# Patient Record
Sex: Female | Born: 1990
Health system: Southern US, Community
[De-identification: ages and names within clinical notes are randomized; demographics above are authoritative.]

## PROBLEM LIST (undated history)

## (undated) DIAGNOSIS — F419 Anxiety disorder, unspecified: Secondary | ICD-10-CM

## (undated) DIAGNOSIS — Z8742 Personal history of other diseases of the female genital tract: Secondary | ICD-10-CM

## (undated) DIAGNOSIS — E559 Vitamin D deficiency, unspecified: Secondary | ICD-10-CM

## (undated) DIAGNOSIS — J45909 Unspecified asthma, uncomplicated: Secondary | ICD-10-CM

## (undated) DIAGNOSIS — L309 Dermatitis, unspecified: Secondary | ICD-10-CM

## (undated) DIAGNOSIS — K219 Gastro-esophageal reflux disease without esophagitis: Secondary | ICD-10-CM

## (undated) DIAGNOSIS — T7840XA Allergy, unspecified, initial encounter: Secondary | ICD-10-CM

## (undated) DIAGNOSIS — Q800 Ichthyosis vulgaris: Secondary | ICD-10-CM

## (undated) DIAGNOSIS — N83209 Unspecified ovarian cyst, unspecified side: Secondary | ICD-10-CM

## (undated) HISTORY — DX: Vitamin D deficiency, unspecified: E55.9

## (undated) HISTORY — DX: Personal history of other diseases of the female genital tract: Z87.42

## (undated) HISTORY — DX: Unspecified ovarian cyst, unspecified side: N83.209

## (undated) HISTORY — DX: Ichthyosis vulgaris: Q80.0

## (undated) HISTORY — DX: Unspecified asthma, uncomplicated: J45.909

## (undated) HISTORY — DX: Allergy, unspecified, initial encounter: T78.40XA

## (undated) HISTORY — PX: WISDOM TOOTH EXTRACTION: SHX21

## (undated) HISTORY — DX: Gastro-esophageal reflux disease without esophagitis: K21.9

## (undated) HISTORY — DX: Dermatitis, unspecified: L30.9

## (undated) HISTORY — DX: Anxiety disorder, unspecified: F41.9

---
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
## 2010-03-20 ENCOUNTER — Emergency Department: Payer: Self-pay | Admitting: Emergency Medicine

## 2010-11-14 ENCOUNTER — Ambulatory Visit: Payer: Self-pay | Admitting: Family Medicine

## 2011-01-10 ENCOUNTER — Ambulatory Visit: Payer: Self-pay | Admitting: Gastroenterology

## 2011-08-27 ENCOUNTER — Emergency Department: Payer: Self-pay | Admitting: Emergency Medicine

## 2011-08-27 LAB — COMPREHENSIVE METABOLIC PANEL
Albumin: 4 g/dL (ref 3.4–5.0)
Alkaline Phosphatase: 61 U/L (ref 50–136)
Anion Gap: 8 (ref 7–16)
BUN: 12 mg/dL (ref 7–18)
Bilirubin,Total: 0.3 mg/dL (ref 0.2–1.0)
Calcium, Total: 8.9 mg/dL (ref 8.5–10.1)
Chloride: 104 mmol/L (ref 98–107)
Co2: 28 mmol/L (ref 21–32)
Creatinine: 0.62 mg/dL (ref 0.60–1.30)
EGFR (African American): >60
EGFR (Non-African Amer.): >60
Glucose: 91 mg/dL (ref 65–99)
Osmolality: 279 (ref 275–301)
Potassium: 3.9 mmol/L (ref 3.5–5.1)
SGOT(AST): 21 U/L (ref 15–37)
SGPT (ALT): 33 U/L
Sodium: 140 mmol/L (ref 136–145)
Total Protein: 7.2 g/dL (ref 6.4–8.2)

## 2011-08-27 LAB — CBC
HCT: 42.9 % (ref 35.0–47.0)
HGB: 14.9 g/dL (ref 12.0–16.0)
MCH: 30.6 pg (ref 26.0–34.0)
MCHC: 34.8 g/dL (ref 32.0–36.0)
MCV: 88 fL (ref 80–100)
Platelet: 292 10*3/uL (ref 150–440)
RBC: 4.88 10*6/uL (ref 3.80–5.20)
RDW: 12.8 % (ref 11.5–14.5)
WBC: 11.8 10*3/uL — ABNORMAL HIGH (ref 3.6–11.0)

## 2011-08-27 LAB — MONONUCLEOSIS SCREEN: Mono Test: NEGATIVE

## 2014-11-22 ENCOUNTER — Ambulatory Visit (INDEPENDENT_AMBULATORY_CARE_PROVIDER_SITE_OTHER): Payer: 59 | Admitting: Family Medicine

## 2014-11-22 ENCOUNTER — Other Ambulatory Visit: Payer: Self-pay | Admitting: Family Medicine

## 2014-11-22 ENCOUNTER — Encounter: Payer: Self-pay | Admitting: Family Medicine

## 2014-11-22 VITALS — BP 108/72 | HR 89 | Temp 98.6°F | Resp 16 | Ht 61.0 in | Wt 137.1 lb

## 2014-11-22 DIAGNOSIS — J4520 Mild intermittent asthma, uncomplicated: Secondary | ICD-10-CM

## 2014-11-22 DIAGNOSIS — L309 Dermatitis, unspecified: Secondary | ICD-10-CM | POA: Insufficient documentation

## 2014-11-22 DIAGNOSIS — Z8742 Personal history of other diseases of the female genital tract: Secondary | ICD-10-CM

## 2014-11-22 DIAGNOSIS — J309 Allergic rhinitis, unspecified: Secondary | ICD-10-CM | POA: Insufficient documentation

## 2014-11-22 DIAGNOSIS — M25531 Pain in right wrist: Secondary | ICD-10-CM | POA: Insufficient documentation

## 2014-11-22 DIAGNOSIS — G5601 Carpal tunnel syndrome, right upper limb: Secondary | ICD-10-CM | POA: Diagnosis not present

## 2014-11-22 HISTORY — DX: Personal history of other diseases of the female genital tract: Z87.42

## 2014-11-22 MED ORDER — B & B CARPAL TUNNEL BRACE MISC: 1.0000 | Freq: Every day | Status: DC

## 2014-11-22 MED ORDER — IBUPROFEN 800 MG PO TABS: 800.0000 mg | ORAL_TABLET | Freq: Three times a day (TID) | ORAL | Status: DC | PRN

## 2014-11-22 MED ORDER — MONTELUKAST SODIUM 10 MG PO TABS: 10.0000 mg | ORAL_TABLET | Freq: Every day | ORAL | Status: DC

## 2014-11-22 NOTE — Progress Notes (Signed)
Name: Hayley Hunt   MRN: [id number]    DOB: Sep 23, 1990   Date:11/22/2014       Progress Note  Subjective  Chief Complaint  Chief Complaint  Patient presents with  . Carpal Tunnel    patient presents today with symptoms of right wrist pain since Father's day. Patient has tried Naproxen and has worn a brace to support it. Patient has had some numbness in her fingers and some radiation to her elbow at times. Patient states that the strength in her hand has decreased.    HPI  Hayley Hunt is a pleasant 24 year old female with no major medical history who presents today to discuss possibility of carpal tunnel syndrome. 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 Then she goes to work at [employer] afterwards some days lifting grocery bags which makes her symptoms worse. Symptoms are located right hand and wrist with initial pain and numbness radial aspect of 1st digit but now has progressed to symptoms involving 1st-3rd digits, weakness in hand grip and strength, dropping items. She has trid OTC naproxen and a home elastic wrist brace which has not helped with her symptoms. Needs refill of her montelukast.   Patient Active Problem List   Diagnosis Date Noted  . Allergic asthma 11/22/2014  . Dermatitis, eczematoid 11/22/2014  . H/O abnormal cervical Papanicolaou smear 11/22/2014  . Carpal tunnel syndrome, right 11/22/2014    History  Substance Use Topics  . Smoking status: Never Smoker   . Smokeless tobacco: Not on file  . Alcohol Use: No     Current outpatient prescriptions:  .  albuterol (VENTOLIN HFA) 108 (90 BASE) MCG/ACT inhaler, Inhale 1-2 puffs into the lungs every 6 (six) hours as needed., Disp: , Rfl:  .  Elastic Bandages & Supports (B & B CARPAL TUNNEL BRACE) MISC, 1 Device by Does not apply route daily., Disp: 1 each, Rfl: 0 .  ibuprofen (ADVIL,MOTRIN) 800 MG tablet, Take 1 tablet (800 mg total) by mouth every 8  (eight) hours as needed., Disp: 50 tablet, Rfl: 1  History reviewed. No pertinent past surgical history.  Family History  Problem Relation Age of Onset  . Asthma Mother     Allergies  Allergen Reactions  . Penicillins Anaphylaxis and Hives  . Latex      Review of Systems  CONSTITUTIONAL: No significant weight changes, fever, chills, weakness or fatigue.  HEENT:  - Eyes: No visual changes.  - Ears: No auditory changes. No pain.  - Nose: No sneezing, congestion, runny nose. - Throat: No sore throat. No changes in swallowing. SKIN: No rash or itching.  CARDIOVASCULAR: No chest pain, chest pressure or chest discomfort. No palpitations or edema.  RESPIRATORY: No shortness of breath, cough or sputum.  GASTROINTESTINAL: No anorexia, nausea, vomiting. No changes in bowel habits. No abdominal pain or blood.  GENITOURINARY: No dysuria. No frequency. No discharge.  NEUROLOGICAL: No headache, dizziness, syncope, paralysis, ataxia. Yes numbness or tingling in the right arm. No memory changes. No change in bowel or bladder control.  MUSCULOSKELETAL: Yes joint pain. No muscle pain. HEMATOLOGIC: No anemia, bleeding or bruising.  LYMPHATICS: No enlarged lymph nodes.  PSYCHIATRIC: No change in mood. No change in sleep pattern.  ENDOCRINOLOGIC: No reports of sweating, cold or heat intolerance. No polyuria or polydipsia.      Objective  BP 108/72 mmHg  Pulse 89  Temp(Src) 98.6 F (37  C) (Oral)  Resp 16  Ht 5\' 1"  (1.549 m)  Wt 137 lb 1.6 oz (62.188 kg)  BMI 25.92 kg/m2  SpO2 98%  LMP 10/11/2014 (Approximate) Body mass index is 25.92 kg/(m^2).  Physical Exam  Constitutional: Patient appears well-developed and well-nourished. In no distress.   Neck: Normal range of motion. Neck supple. No JVD present. No thyromegaly present.  Cardiovascular: Normal rate, regular rhythm and normal heart sounds.  No murmur heard.  Pulmonary/Chest: Effort normal and breath sounds normal. No  respiratory distress. Musculoskeletal: Normal range of motion bilateral UE and LE, no joint effusions. Peripheral vascular: Bilateral LE no edema.  Neurological: CN II-XII grossly intact with no focal deficits. Alert and oriented to person, place, and time. Coordination, balance, strength, speech and gait are normal. Positive Phalen's and Tinnel's testing of right wrist. No obvious wasting of thenar of hypothenar muscles however her grip strength is 4/5 in right hand. She has no MCP/PIP/DIP joint inflammation. Skin: Skin is warm and dry. Patches of eczema skin on hands and legs. Psychiatric: Patient has a normal mood and affect. Behavior is normal in office today. Judgment and thought content normal in office today.  Assessment & Plan   1. Carpal tunnel syndrome, right NSAIDs, heat ice alternating, modification of activity (work note provided), Neurology referral for nerve conduction study and further management. Rx for new brace provided.  - Elastic Bandages & Supports (B & B CARPAL TUNNEL BRACE) MISC; 1 Device by Does not apply route daily.  Dispense: 1 each; Refill: 0 - ibuprofen (ADVIL,MOTRIN) 800 MG tablet; Take 1 tablet (800 mg total) by mouth every 8 (eight) hours as needed.  Dispense: 50 tablet; Refill: 1 - Ambulatory referral to Neurology

## 2014-11-22 NOTE — Patient Instructions (Signed)
Carpal Tunnel Syndrome The carpal tunnel is a narrow area located on the palm side of your wrist. The tunnel is formed by the wrist bones and ligaments. Nerves, blood vessels, and tendons pass through the carpal tunnel. Repeated wrist motion or certain diseases may cause swelling within the tunnel. This swelling pinches the main nerve in the wrist (median nerve) and causes the painful hand and arm condition called carpal tunnel syndrome. CAUSES   Repeated wrist motions.  Wrist injuries.  Certain diseases like arthritis, diabetes, alcoholism, hyperthyroidism, and kidney failure.  Obesity.  Pregnancy. SYMPTOMS   A "pins and needles" feeling in your fingers or hand, especially in your thumb, index and middle fingers.  Tingling or numbness in your fingers or hand.  An aching feeling in your entire arm, especially when your wrist and elbow are bent for long periods of time.  Wrist pain that goes up your arm to your shoulder.  Pain that goes down into your palm or fingers.  A weak feeling in your hands. DIAGNOSIS  Your health care provider will take your history and perform a physical exam. An electromyography test may be needed. This test measures electrical signals sent out by your nerves into the muscles. The electrical signals are usually slowed by carpal tunnel syndrome. You may also need X-rays. TREATMENT  Carpal tunnel syndrome may clear up by itself. Your health care provider may recommend a wrist splint or medicine such as a nonsteroidal anti-inflammatory medicine. Cortisone injections may help. Sometimes, surgery may be needed to free the pinched nerve.  HOME CARE INSTRUCTIONS   Take all medicine as directed by your health care provider. Only take over-the-counter or prescription medicines for pain, discomfort, or fever as directed by your health care provider.  If you were given a splint to keep your wrist from bending, wear it as directed. It is important to wear the splint at  night. Wear the splint for as long as you have pain or numbness in your hand, arm, or wrist. This may take 1 to 2 months.  Rest your wrist from any activity that may be causing your pain. If your symptoms are work-related, you may need to talk to your employer about changing to a job that does not require using your wrist.  Put ice on your wrist after long periods of wrist activity.  Put ice in a plastic bag.  Place a towel between your skin and the bag.  Leave the ice on for 15-20 minutes, 03-04 times a day.  Keep all follow-up visits as directed by your health care provider. This includes any orthopedic referrals, physical therapy, and rehabilitation. Any delay in getting necessary care could result in a delay or failure of your condition to heal. SEEK IMMEDIATE MEDICAL CARE IF:   You have new, unexplained symptoms.  Your symptoms get worse and are not helped or controlled with medicines. MAKE SURE YOU:   Understand these instructions.  Will watch your condition.  Will get help right away if you are not doing well or get worse. Document Released: 04/06/2000 Document Revised: 08/24/2013 Document Reviewed: 02/23/2011 ExitCare Patient Information 2015 [employer]. This information is not intended to replace advice given to you by your health care provider. Make sure you discuss any questions you have with your health care provider.  

## 2014-11-30 ENCOUNTER — Telehealth: Payer: Self-pay | Admitting: Family Medicine

## 2014-11-30 NOTE — Telephone Encounter (Signed)
Patient was referred to [facility] for carpel tunnel in her wrist. They can not get her in until Sept 14. Patient is requesting that you send the referral to [facility]) they can get her in sooner. Please fax the referral to [phone] ATTN: Gerald Stabs

## 2014-12-03 NOTE — Telephone Encounter (Signed)
Referral information has been faxed to [facility], ATTN: Gerald Stabs to [phone], they'll contact the patient and schedule the appt directly with her then notify our office.

## 2015-03-14 ENCOUNTER — Telehealth: Payer: Self-pay | Admitting: Family Medicine

## 2015-03-14 ENCOUNTER — Other Ambulatory Visit: Payer: Self-pay | Admitting: Family Medicine

## 2015-03-14 DIAGNOSIS — M25531 Pain in right wrist: Secondary | ICD-10-CM

## 2015-03-14 NOTE — Telephone Encounter (Signed)
I've placed a referral to any Rheum other than [facility] however if there is a specific physician or office she is preferring to go to please let me know. How far is she willing to travel?

## 2015-03-14 NOTE — Telephone Encounter (Signed)
Was seen by Dr Joya Salm at [facility]: he told the patient that she does not have carpel tunnel, therefore he would like to refer her to a rheumatologist (patient requesting anyone but [facility]).  If we are not able to do the referral, Dr Joya Salm will to do the referral for [place] but we have to give him the okay because we are her PCP

## 2015-03-15 NOTE — Telephone Encounter (Signed)
Spoke with patient and she said that it is not a certain doctor that she would like to see and that she is willing to travel to [place] or [place]

## 2015-03-15 NOTE — Telephone Encounter (Signed)
Referral has been already sent to [facility].

## 2015-04-04 ENCOUNTER — Ambulatory Visit (INDEPENDENT_AMBULATORY_CARE_PROVIDER_SITE_OTHER): Payer: 59 | Admitting: Family Medicine

## 2015-04-04 ENCOUNTER — Encounter: Payer: Self-pay | Admitting: Family Medicine

## 2015-04-04 VITALS — BP 100/62 | HR 96 | Temp 98.8°F | Resp 16 | Wt 135.2 lb

## 2015-04-04 DIAGNOSIS — Z0000 Encounter for general adult medical examination without abnormal findings: Secondary | ICD-10-CM | POA: Diagnosis not present

## 2015-04-04 DIAGNOSIS — L309 Dermatitis, unspecified: Secondary | ICD-10-CM

## 2015-04-04 DIAGNOSIS — Z8742 Personal history of other diseases of the female genital tract: Secondary | ICD-10-CM

## 2015-04-04 DIAGNOSIS — J309 Allergic rhinitis, unspecified: Secondary | ICD-10-CM

## 2015-04-04 DIAGNOSIS — Z23 Encounter for immunization: Secondary | ICD-10-CM

## 2015-04-04 DIAGNOSIS — Z124 Encounter for screening for malignant neoplasm of cervix: Secondary | ICD-10-CM | POA: Diagnosis not present

## 2015-04-04 NOTE — Progress Notes (Signed)
Name: Hayley Hunt   MRN: [id number]    DOB: 1990/04/25   Date:04/04/2015       Progress Note  Subjective  Chief Complaint  Chief Complaint  Patient presents with  . Annual Exam  . Allergies    patient has never been on rx medication but she uses Flonase.    HPI  Patient is here today for a Complete Female Physical Exam:  The patient has has no unusual complaints and complains of allergic rhinitis. Onset years ago. Symptoms include nasal congestion and drainage in the back of throat leading to constant clearing. Using generic loratadine and brand OTC flonase.   Also has chronic dry skin. Can not use sun tan bed as she had previously during winter months to help with dryness due to dermatologist recommending against use of sun tanning beds. She does have topical steroid use which is keeping her skin from cracking but dryness and itching persists. Planning to see dermatologist soon.   Overall feels healthy. Diet is well balanced. In general does exercise regularly. Sees dentist regularly and addresses vision concerns with ophthalmologist if applicable. In regards to sexual activity the patient is currently sexually active. Currently is not concerned about exposure to any STDs.   Menstrual history is regular, monthly.    Past Medical History  Diagnosis Date  . Allergic asthma without acute exacerbation or status asthmaticus   . Eczema   . Ovarian cyst   . Common ichthyosis   . History of abnormal cervical Pap smear     History reviewed. No pertinent past surgical history.  Family History  Problem Relation Age of Onset  . Asthma Mother     Social History   Social History  . Marital Status: Single    Spouse Name: N/A  . Number of Children: N/A  . Years of Education: N/A   Occupational History  . Not on file.   Social History Main Topics  . Smoking status: Never Smoker   . Smokeless tobacco: Not on file  . Alcohol Use: No  . Drug Use: No  . Sexual Activity:   Partners: Male    Birth Control/ Protection: Condom   Other Topics Concern  . Not on file   Social History Narrative     Current outpatient prescriptions:  .  albuterol (VENTOLIN HFA) 108 (90 BASE) MCG/ACT inhaler, Inhale 1-2 puffs into the lungs every 6 (six) hours as needed., Disp: , Rfl:  .  diclofenac (VOLTAREN) 50 MG EC tablet, take 1 tablet by mouth twice a day with food or MILK, Disp: , Rfl: 0 .  Elastic Bandages & Supports (B & B CARPAL TUNNEL BRACE) MISC, 1 Device by Does not apply route daily., Disp: 1 each, Rfl: 0  Allergies  Allergen Reactions  . Penicillins Anaphylaxis and Hives  . Latex     ROS  CONSTITUTIONAL: No significant weight changes, fever, chills, weakness or fatigue.  HEENT:  - Eyes: No visual changes.  - Ears: No auditory changes. No pain.  - Nose: Yes sneezing, congestion, runny nose. - Throat: No sore throat. No changes in swallowing. SKIN: No rash. Chronic dryness with itching. CARDIOVASCULAR: No chest pain, chest pressure or chest discomfort. No palpitations or edema.  RESPIRATORY: No shortness of breath, cough or sputum.  GASTROINTESTINAL: No anorexia, nausea, vomiting. No changes in bowel habits. No abdominal pain or blood.  GENITOURINARY: No dysuria. No frequency. No discharge.  NEUROLOGICAL: No headache, dizziness, syncope, paralysis, ataxia, numbness or tingling in the  extremities. No memory changes. No change in bowel or bladder control.  MUSCULOSKELETAL: No joint pain. No muscle pain. HEMATOLOGIC: No anemia, bleeding or bruising.  LYMPHATICS: No enlarged lymph nodes.  PSYCHIATRIC: No change in mood. No change in sleep pattern.  ENDOCRINOLOGIC: No reports of sweating, cold or heat intolerance. No polyuria or polydipsia.   Objective  Filed Vitals:   04/04/15 0838  BP: 100/62  Pulse: 96  Temp: 98.8 F (37.1 C)  TempSrc: Oral  Resp: 16  Weight: 135 lb 3.2 oz (61.326 kg)  SpO2: 97%   Body mass index is 25.56 kg/(m^2).  Depression  screen [facility] 2/9 04/04/2015 11/22/2014  Decreased Interest 0 0  Down, Depressed, Hopeless 0 0  PHQ - 2 Score 0 0     Physical Exam  Constitutional: Patient appears well-developed and well-nourished. In no distress.  HEENT:  - Head: Normocephalic and atraumatic.  - Ears: Bilateral TMs gray, no erythema or effusion - Nose: Nasal mucosa moist, boggy - Mouth/Throat: Oropharynx is clear and moist. No tonsillar hypertrophy or erythema. No post nasal drainage.  - Eyes: Conjunctivae clear, EOM movements normal. PERRLA. No scleral icterus.  Neck: Normal range of motion. Neck supple. No JVD present. No thyromegaly present.  Cardiovascular: Normal rate, regular rhythm and normal heart sounds.  No murmur heard.  Pulmonary/Chest: Effort normal and breath sounds normal. No respiratory distress. Abdominal: Soft. Bowel sounds are normal, no distension. There is no tenderness. no masses BREAST: Bilateral breast exam normal with no masses, skin changes or nipple discharge FEMALE GENITALIA:  External genitalia normal External urethra normal Vaginal vault normal without discharge or lesions Cervix normal without discharge or lesions Bimanual exam normal without masses RECTAL: no rectal masses or hemorrhoids Musculoskeletal: Normal range of motion bilateral UE and LE, no joint effusions. Peripheral vascular: Bilateral LE no edema. Neurological: CN II-XII grossly intact with no focal deficits. Alert and oriented to person, place, and time. Coordination, balance, strength, speech and gait are normal.  Skin: Skin is warm with excessive dryness throughout, worse on hands. No rash noted. No erythema.  Psychiatric: Patient has a normal mood and affect. Behavior is normal in office today. Judgment and thought content normal in office today.   Assessment & Plan  1. Annual physical exam Discussed in detail all recommended preventative measures appropriate for age and gender now and in the future.   2. Need  for immunization against influenza Done  3. Dermatitis, eczematoid Continue working with dermatologist. Avoid harsh soaps.   4. Allergic rhinitis, unspecified allergic rhinitis type Recommended allergy consultation, she declined for now. Will try Allegra OTC.   5. H/O abnormal cervical Papanicolaou smear Previously ASCUS HPV, colpo negative, will repeat annually.   6. Encounter for screening for malignant neoplasm of cervix  - Pap IG and HPV (high risk) DNA detection

## 2015-04-08 LAB — PAP IG AND HPV HIGH-RISK
HPV, high-risk: NEGATIVE
PAP Smear Comment: 0

## 2015-07-29 ENCOUNTER — Encounter: Payer: Self-pay | Admitting: Family Medicine

## 2015-07-29 ENCOUNTER — Ambulatory Visit (INDEPENDENT_AMBULATORY_CARE_PROVIDER_SITE_OTHER): Payer: 59 | Admitting: Family Medicine

## 2015-07-29 VITALS — BP 110/78 | HR 90 | Temp 98.4°F | Resp 12 | Wt 137.0 lb

## 2015-07-29 DIAGNOSIS — R143 Flatulence: Secondary | ICD-10-CM | POA: Diagnosis not present

## 2015-07-29 DIAGNOSIS — R1030 Lower abdominal pain, unspecified: Secondary | ICD-10-CM

## 2015-07-29 DIAGNOSIS — R194 Change in bowel habit: Secondary | ICD-10-CM | POA: Diagnosis not present

## 2015-07-29 DIAGNOSIS — R140 Abdominal distension (gaseous): Secondary | ICD-10-CM | POA: Diagnosis not present

## 2015-07-29 NOTE — Progress Notes (Signed)
BP 110/78 mmHg  Pulse 90  Temp(Src) 98.4 F (36.9 C) (Oral)  Resp 12  Wt 137 lb (62.143 kg)  SpO2 98%  LMP 07/23/2015 (Approximate)   Subjective:    Patient ID: Hayley Hunt, female    DOB: Nov 06, 1990, 25 y.o.   MRN: :[id number]  HPI: Hayley Hunt is a 25 y.o. female  Chief Complaint  Patient presents with  . Abdominal Pain    onset 3 months unchanged has went to urgent care no imaging all labs was normal they notified her she may have gluten allergy.  Pain is located in the lower abdomen.  Patient denis any trauama, nausea, vomiting, diarrhea, constipation or dyuria.  She states hurts worse after she eats expericiences some bloating.  Constant pain describes as uncomfartable bloating and gas.    Patient has been having abdominal pain x 3 months She went to urgent care and ran tests for pregnancy, kidneys, appendix, and everything was fine She thinks it might be gluten intolerance She is lactose intolerance; still has some anyways; NO milk; very gassy; foul odor to the gas When she drinks milk, different smell than normal,  Had bronchitis in January and was on antibiotics No blood in the stool; looks normal, and healthy looking; more frequent , more amount Would like to checked for celiac No fam hx of celiac disease; no fam hx of rheumatology problems No fevers No nausea, but having abdominal bloatint She just had labs done by rheum; saw neuro first and then rheum and ortho; all since last June; just got the shot 3 weeks ago; had it so long; pain on the thenar eminence still; worried about ovaries; used to have a lot of ovarian cysts; has bloating down low in her pelvis; urgent care doctor agreed and thought it might be an ovarian cyst when he pushed on it  Relevant past medical, surgical, family and social history reviewed and updated as indicated  Past Medical History  Diagnosis Date  . Allergic asthma without acute exacerbation or status asthmaticus   . Eczema   .  Ovarian cyst   . Common ichthyosis   . History of abnormal cervical Pap smear    No past surgical history on file.  Family History  Problem Relation Age of Onset  . Asthma Mother    Medications reviewed  Review of Systems Per HPI unless specifically indicated above     Objective:    BP 110/78 mmHg  Pulse 90  Temp(Src) 98.4 F (36.9 C) (Oral)  Resp 12  Wt 137 lb (62.143 kg)  SpO2 98%  LMP 07/23/2015 (Approximate)  Wt Readings from Last 3 Encounters:  07/29/15 137 lb (62.143 kg)  04/04/15 135 lb 3.2 oz (61.326 kg)  11/22/14 137 lb 1.6 oz (62.188 kg)    Physical Exam  Constitutional: She appears well-developed and well-nourished.  HENT:  Mouth/Throat: Mucous membranes are normal.  Eyes: EOM are normal. No scleral icterus.  Cardiovascular: Normal rate and regular rhythm.   Pulmonary/Chest: Effort normal and breath sounds normal.  Abdominal: Soft. Bowel sounds are normal. She exhibits no distension and no mass. There is no tenderness. There is no guarding.  Neurological: She is alert.  Skin: No pallor.  Psychiatric: She has a normal mood and affect. Her behavior is normal.      Assessment & Plan:   Problem List Items Addressed This Visit      Other   Abdominal bloating - Primary    Will get [place]  and labs      Relevant Orders   US Pelvis Complete   US Transvaginal Non-OB   Postprandial abdominal bloating   Relevant Orders   US Pelvis Complete   US Transvaginal Non-OB   Change in stool habits    Will get [place], labs; refer to GI if not improving with probitoics      Relevant Orders   Comprehensive metabolic panel (Completed)   Celiac Disease Ab Screen w/Rfx (Completed)   Intestinal gas excretion    Trial of probiotics for one month      Abdominal pain, lower    With bloating; will get [place] of abdomen and pelvis; also getting labs to r/o celiac disease; start probiotic      Relevant Orders   US Pelvis Complete   US Transvaginal Non-OB   Comprehensive  metabolic panel (Completed)   Celiac Disease Ab Screen w/Rfx (Completed)      Follow up plan: No Follow-up on file.  An after-visit summary was printed and given to the patient at [facility].  Please see the patient instructions which may contain other information and recommendations beyond what is mentioned above in the assessment and plan. Orders Placed This Encounter  Procedures  . US Pelvis Complete  . US Transvaginal Non-OB  . Comprehensive metabolic panel  . Celiac Disease Ab Screen w/Rfx

## 2015-07-29 NOTE — Patient Instructions (Addendum)
Try probiotics daily for one month I'll recommend your next pap smear be Apr 04, 2016 We'll get the pelvic ultrasound We'll see what the labs show today If you have not heard anything from my staff in a week about any orders/referrals/studies from today, please contact us here to follow-up [phone] Call or return if your symptoms aren't resolving in the next few weeks, or certainly call or come in if they get worse

## 2015-08-02 ENCOUNTER — Telehealth: Payer: Self-pay | Admitting: Family Medicine

## 2015-08-02 LAB — COMPREHENSIVE METABOLIC PANEL
ALT: 13 IU/L (ref 0–32)
AST: 17 IU/L (ref 0–40)
Albumin/Globulin Ratio: 2.2 (ref 1.2–2.2)
Albumin: 4.9 g/dL (ref 3.5–5.5)
Alkaline Phosphatase: 74 IU/L (ref 39–117)
BUN/Creatinine Ratio: 17 (ref 9–23)
BUN: 13 mg/dL (ref 6–20)
Bilirubin Total: 0.4 mg/dL (ref 0.0–1.2)
CO2: 25 mmol/L (ref 18–29)
Calcium: 9.7 mg/dL (ref 8.7–10.2)
Chloride: 98 mmol/L (ref 96–106)
Creatinine, Ser: 0.75 mg/dL (ref 0.57–1.00)
GFR calc Af Amer: 128 mL/min/{1.73_m2} (ref >59)
GFR calc non Af Amer: 111 mL/min/{1.73_m2} (ref >59)
Globulin, Total: 2.2 g/dL (ref 1.5–4.5)
Glucose: 96 mg/dL (ref 65–99)
Potassium: 4.3 mmol/L (ref 3.5–5.2)
Sodium: 139 mmol/L (ref 134–144)
Total Protein: 7.1 g/dL (ref 6.0–8.5)

## 2015-08-02 LAB — CELIAC DISEASE AB SCREEN W/RFX
Antigliadin Abs, IgA: 1 units (ref 0–19)
IgA/Immunoglobulin A, Serum: 118 mg/dL (ref 87–352)
Transglutaminase IgA: <2 U/mL (ref 0–3)

## 2015-08-02 NOTE — Telephone Encounter (Signed)
I called, left msg, labs okay; if any specific questions or if she wants to discuss them further, please call me back

## 2015-08-03 NOTE — Assessment & Plan Note (Signed)
Will get [place] and labs

## 2015-08-03 NOTE — Assessment & Plan Note (Signed)
Will get [place], labs; refer to GI if not improving with probitoics

## 2015-08-03 NOTE — Assessment & Plan Note (Signed)
With bloating; will get [place] of abdomen and pelvis; also getting labs to r/o celiac disease; start probiotic

## 2015-08-03 NOTE — Assessment & Plan Note (Signed)
Trial of probiotics for one month

## 2015-08-10 ENCOUNTER — Ambulatory Visit
Admission: RE | Admit: 2015-08-10 | Discharge: 2015-08-10 | Disposition: A | Discharge status: Home / Self Care | Payer: 59 | Source: Ambulatory Visit | Attending: Family Medicine | Admitting: Family Medicine

## 2015-08-10 DIAGNOSIS — R140 Abdominal distension (gaseous): Secondary | ICD-10-CM | POA: Insufficient documentation

## 2015-08-10 DIAGNOSIS — N83202 Unspecified ovarian cyst, left side: Secondary | ICD-10-CM | POA: Diagnosis not present

## 2015-08-11 ENCOUNTER — Telehealth: Payer: Self-pay | Admitting: Family Medicine

## 2015-08-11 NOTE — Telephone Encounter (Signed)
I sent her a note through [place]

## 2015-08-11 NOTE — Telephone Encounter (Signed)
PT ASKING FOR RESULTS OF TEST

## 2015-09-01 ENCOUNTER — Emergency Department
Admission: EM | Admit: 2015-09-01 | Discharge: 2015-09-01 | Disposition: A | Discharge status: Home / Self Care | Payer: 59 | Attending: Emergency Medicine | Admitting: Emergency Medicine

## 2015-09-01 ENCOUNTER — Emergency Department: Payer: 59

## 2015-09-01 DIAGNOSIS — J45901 Unspecified asthma with (acute) exacerbation: Secondary | ICD-10-CM

## 2015-09-01 DIAGNOSIS — J45909 Unspecified asthma, uncomplicated: Secondary | ICD-10-CM | POA: Insufficient documentation

## 2015-09-01 DIAGNOSIS — R05 Cough: Secondary | ICD-10-CM | POA: Diagnosis present

## 2015-09-01 MED ORDER — PREDNISONE 10 MG PO TABS: ORAL_TABLET | ORAL | Status: DC

## 2015-09-01 MED ORDER — IPRATROPIUM-ALBUTEROL 0.5-2.5 (3) MG/3ML IN SOLN
3.0000 mL | Freq: Once | RESPIRATORY_TRACT | Status: AC
  Administered 2015-09-01: 3 mL via RESPIRATORY_TRACT
  Filled 2015-09-01: qty 3

## 2015-09-01 MED ORDER — IPRATROPIUM-ALBUTEROL 0.5-2.5 (3) MG/3ML IN SOLN: 3.0000 mL | RESPIRATORY_TRACT | Status: DC | PRN

## 2015-09-01 NOTE — ED Provider Notes (Signed)
[facility] Emergency Department Provider Note  ____________________________________________  Time seen: Approximately 9:52 PM  I have reviewed the triage vital signs and the nursing notes.   HISTORY  Chief Complaint Cough    HPI KEYSTAL BRIDEWELL is a 25 y.o. female who presents for evaluation of asthma and cough. Patient states that she is to return inhaler at home but feels extremely short of breath. Feels like she has a hard time catching her breath. Patient reports symptoms started yesterday and progressively worsened today.   Past Medical History  Diagnosis Date  . Allergic asthma without acute exacerbation or status asthmaticus   . Eczema   . Ovarian cyst   . Common ichthyosis   . History of abnormal cervical Pap smear     Patient Active Problem List   Diagnosis Date Noted  . Abdominal bloating 07/29/2015  . Postprandial abdominal bloating 07/29/2015  . Change in stool habits 07/29/2015  . Intestinal gas excretion 07/29/2015  . Abdominal pain, lower 07/29/2015  . Annual physical exam 04/04/2015  . Encounter for immunization 04/04/2015  . Encounter for screening for malignant neoplasm of cervix 04/04/2015  . Allergic rhinitis 11/22/2014  . Dermatitis, eczematoid 11/22/2014  . H/O abnormal cervical Papanicolaou smear 11/22/2014  . Wrist pain, right 11/22/2014    No past surgical history on file.  Current Outpatient Rx  Name  Route  Sig  Dispense  Refill  . albuterol (VENTOLIN HFA) 108 (90 BASE) MCG/ACT inhaler   Inhalation   Inhale 1-2 puffs into the lungs every 6 (six) hours as needed.         Marland Kitchen ipratropium-albuterol (DUONEB) 0.5-2.5 (3) MG/3ML SOLN   Nebulization   Take 3 mLs by nebulization every 2 (two) hours as needed.   360 mL   0   . predniSONE (DELTASONE) 10 MG tablet      Take 6 pills on day 1 and decrease by 1 pill daily.   21 tablet   0     Allergies Penicillins and Latex  Family History  Problem Relation Age  of Onset  . Asthma Mother     Social History Social History  Substance Use Topics  . Smoking status: Never Smoker   . Smokeless tobacco: Not on file  . Alcohol Use: No    Review of Systems Constitutional: No fever/chills Cardiovascular: Denies chest pain. Respiratory: Positive for shortness of breath. Skin: Negative for rash. Neurological: Negative for headaches, focal weakness or numbness.  10-point ROS otherwise negative.  ____________________________________________   PHYSICAL EXAM:  VITAL SIGNS: ED Triage Vitals  Enc Vitals Group     BP 09/01/15 2133 120/78 mmHg     Pulse Rate 09/01/15 2133 87     Resp 09/01/15 2133 20     Temp 09/01/15 2133 98.3 F (36.8 C)     Temp Source 09/01/15 2133 Oral     SpO2 09/01/15 2133 100 %     Weight 09/01/15 2133 137 lb (62.143 kg)     Height 09/01/15 2133 5' (1.524 m)     Head Cir --      Peak Flow --      Pain Score 09/01/15 2151 0     Pain Loc --      Pain Edu? --      Excl. in [place]? --     Constitutional: Alert and oriented. Well appearing and in no acute distress. Neck: No stridor.   Cardiovascular: Normal rate, regular rhythm. Grossly normal heart sounds.  Good peripheral circulation. Respiratory: Normal respiratory effort.  No retractions. Lungs CTABWith minimal wheezing.. Skin:  Skin is warm, dry and intact. No rash noted. Psychiatric: Mood and affect are normal. Speech and behavior are normal.  ____________________________________________   LABS (all labs ordered are listed, but only abnormal results are displayed)  Labs Reviewed - No data to display ____________________________________________  RADIOLOGY  No acute cardiopulmonary findings. ____________________________________________   PROCEDURES  Procedure(s) performed: None  Critical Care performed: No  ____________________________________________   INITIAL IMPRESSION / ASSESSMENT AND PLAN / ED COURSE  Pertinent labs & imaging results that  were available during my care of the patient were reviewed by me and considered in my medical decision making (see chart for details).  Acute exacerbation of asthma resolved prior to discharge. Patient was given a DuoNeb treatment while in the ED with significant improvement. Discharged home with prescription for tapering dose of prednisone as well as DuoNeb inhalation therapy treatments for home. ____________________________________________   FINAL CLINICAL IMPRESSION(S) / ED DIAGNOSES  Final diagnoses:  Asthma attack     This chart was dictated using voice recognition software/Dragon. Despite best efforts to proofread, errors can occur which can change the meaning. Any change was purely unintentional.   Arlyss Repress, PA-C 09/01/15 2314  Lavonia Drafts, MD 09/01/15 [phone]

## 2015-09-01 NOTE — Discharge Instructions (Signed)

## 2015-09-01 NOTE — ED Notes (Signed)
Pt in with co shob states has asthma and yesterday developed a cough.  Has used inhalers at home but still feels shob, none noted in triage at this time.

## 2015-09-13 ENCOUNTER — Ambulatory Visit (INDEPENDENT_AMBULATORY_CARE_PROVIDER_SITE_OTHER): Payer: 59 | Admitting: Family Medicine

## 2015-09-13 ENCOUNTER — Encounter: Payer: Self-pay | Admitting: Family Medicine

## 2015-09-13 VITALS — BP 120/68 | HR 99 | Temp 98.6°F | Resp 16 | Wt 137.0 lb

## 2015-09-13 DIAGNOSIS — J45909 Unspecified asthma, uncomplicated: Secondary | ICD-10-CM | POA: Insufficient documentation

## 2015-09-13 DIAGNOSIS — J45998 Other asthma: Secondary | ICD-10-CM

## 2015-09-13 DIAGNOSIS — R143 Flatulence: Secondary | ICD-10-CM | POA: Diagnosis not present

## 2015-09-13 HISTORY — DX: Unspecified asthma, uncomplicated: J45.909

## 2015-09-13 MED ORDER — MONTELUKAST SODIUM 10 MG PO TABS: 10.0000 mg | ORAL_TABLET | Freq: Every day | ORAL | Status: DC

## 2015-09-13 NOTE — Assessment & Plan Note (Signed)
Try going gluten free for one month; refer to GI if not improving

## 2015-09-13 NOTE — Assessment & Plan Note (Signed)
Start singulair; avoid triggers

## 2015-09-13 NOTE — Progress Notes (Signed)
BP 120/68 mmHg  Pulse 99  Temp(Src) 98.6 F (37 C) (Oral)  Resp 16  Wt 137 lb (62.143 kg)  SpO2 98%  LMP 08/27/2015 (Approximate)   Subjective:    Patient ID: Hayley Hunt, female    DOB: 1990/06/21, 25 y.o.   MRN: [place]:[id number]  HPI: Hayley Hunt is a 25 y.o. female  Chief Complaint  Patient presents with  . ER follow-up  . Asthma   She went to [facility] a month ago; after returning, breathing wasn't the same; 2 weeks ended up in the ER after doing her inhaler; they gave her neb treatments in ER and steroid taper; no antibiotics; they said other people were also having asthma attacks, the same night Her throat was also bothering her; felt tight all the way up She has had asthma her entire life, and this was the first time she felt it all the way up She moved a week before she went to [facility]; floors are mostly carpet; no potted plants; no stuffed animals; does have some pillows; has a Market researcher, two years  She is having some stomach issues; she was taking probiotics and it got better; every time she ate, bloating, gassy, going to the bathroom a lot; regular BMs now, but still gassy; she had the ultrasound, cyst on ovaries; stomach grows, stomach gets distended; gaining weight lately; had a really bad gas pain along the left colic gutter  ------------------------- CLINICAL DATA: Lower abdominal pain and bloating for 3 months. History of ovarian cysts. LMP 07/23/2015.  EXAM: TRANSABDOMINAL AND TRANSVAGINAL ULTRASOUND OF PELVIS  TECHNIQUE: Both transabdominal and transvaginal ultrasound examinations of the pelvis were performed. Transabdominal technique was performed for global imaging of the pelvis including uterus, ovaries, adnexal regions, and pelvic cul-de-sac. It was necessary to proceed with endovaginal exam following the transabdominal exam to visualize the endometrial stripe and ovaries.  COMPARISON: 11/14/2010  FINDINGS: Uterus  Measurements: 8.1 x  3.5 x 5.8 cm. No fibroids or other mass visualized.  Endometrium  Thickness: 9 mm. No focal abnormality visualized.  Right ovary  Measurements: 3.6 x 2.3 x 3.8 cm. Normal appearance/no adnexal mass. Small approximately 2 cm corpus luteum cyst noted.  Left ovary  Measurements: 2.8 x 2.2 x 1.9 cm. Normal appearance. A tiny simple left paraovarian cyst is seen measuring 1.1 cm in maximum diameter.  Other findings  No abnormal free fluid.  IMPRESSION: 1.1 cm benign-appearing left paraovarian cyst.  No other sonographic abnormality identified.   Electronically Signed  By: Earle Gell M.D.  On: 08/10/2015 20:08  Depression screen [facility] 2/9 09/13/2015 07/29/2015 04/04/2015 11/22/2014  Decreased Interest 0 0 0 0  Down, Depressed, Hopeless 0 0 0 0  PHQ - 2 Score 0 0 0 0   Relevant past medical, surgical, family and social history reviewed Past Medical History  Diagnosis Date  . Allergic asthma without acute exacerbation or status asthmaticus   . Eczema   . Ovarian cyst   . Common ichthyosis   . History of abnormal cervical Pap smear   . Asthma, mild 09/13/2015   Family History  Problem Relation Age of Onset  . Asthma Mother    Social History  Substance Use Topics  . Smoking status: Never Smoker   . Smokeless tobacco: None  . Alcohol Use: No  Dog: Zane, Yorkie  Interim medical history since last visit reviewed. Allergies and medications reviewed  Review of Systems Per HPI unless specifically indicated above     Objective:  BP 120/68 mmHg  Pulse 99  Temp(Src) 98.6 F (37 C) (Oral)  Resp 16  Wt 137 lb (62.143 kg)  SpO2 98%  LMP 08/27/2015 (Approximate)  Wt Readings from Last 3 Encounters:  09/13/15 137 lb (62.143 kg)  09/01/15 137 lb (62.143 kg)  07/29/15 137 lb (62.143 kg)    Physical Exam  Constitutional: She appears well-developed and well-nourished. No distress.  Eyes: No scleral icterus.  Cardiovascular: Normal rate and regular  rhythm.   Pulmonary/Chest: Effort normal and breath sounds normal. No respiratory distress. She has no wheezes.  Abdominal: Bowel sounds are normal. She exhibits no distension. There is no tenderness. There is no guarding.  Skin: No pallor.  Psychiatric: She has a normal mood and affect.    Results for orders placed or performed in visit on 07/29/15  Comprehensive metabolic panel  Result Value Ref Range   Glucose 96 65 - 99 mg/dL   BUN 13 6 - 20 mg/dL   Creatinine, Ser 0.75 0.57 - 1.00 mg/dL   GFR calc non Af Amer 111 >59 mL/min/1.73   GFR calc Af Amer 128 >59 mL/min/1.73   BUN/Creatinine Ratio 17 9 - 23   Sodium 139 134 - 144 mmol/L   Potassium 4.3 3.5 - 5.2 mmol/L   Chloride 98 96 - 106 mmol/L   CO2 25 18 - 29 mmol/L   Calcium 9.7 8.7 - 10.2 mg/dL   Total Protein 7.1 6.0 - 8.5 g/dL   Albumin 4.9 3.5 - 5.5 g/dL   Globulin, Total 2.2 1.5 - 4.5 g/dL   Albumin/Globulin Ratio 2.2 1.2 - 2.2   Bilirubin Total 0.4 0.0 - 1.2 mg/dL   Alkaline Phosphatase 74 39 - 117 IU/L   AST 17 0 - 40 IU/L   ALT 13 0 - 32 IU/L  Celiac Disease Ab Screen w/Rfx  Result Value Ref Range   Antigliadin Abs, IgA 1 0 - 19 units   Transglutaminase IgA <2 0 - 3 U/mL   IgA/Immunoglobulin A, Serum 118 87 - 352 mg/dL      Assessment & Plan:   Problem List Items Addressed This Visit      Respiratory   Asthma, mild    Start singulair; avoid triggers      Relevant Medications   montelukast (SINGULAIR) 10 MG tablet     Other   Intestinal gas excretion - Primary    Try going gluten free for one month; refer to GI if not improving         Follow up plan: Return if symptoms worsen or fail to improve.  An after-visit summary was printed and given to the patient at [place].  Please see the patient instructions which may contain other information and recommendations beyond what is mentioned above in the assessment and plan.  Meds ordered this encounter  Medications  . montelukast (SINGULAIR) 10 MG  tablet    Sig: Take 1 tablet (10 mg total) by mouth at bedtime.    Dispense:  30 tablet    Refill:  11    No orders of the defined types were placed in this encounter.

## 2015-09-13 NOTE — Patient Instructions (Signed)
Try going completely gluten free for one month If your bowel symptoms persist, then let me know and we can refer you to the gastroenterologist Start the singulair Goal asthma control is rescue inhaler no more than twice a week

## 2015-09-28 ENCOUNTER — Ambulatory Visit: Payer: 59 | Admitting: Family Medicine

## 2015-10-24 ENCOUNTER — Ambulatory Visit (INDEPENDENT_AMBULATORY_CARE_PROVIDER_SITE_OTHER): Payer: 59 | Admitting: Family Medicine

## 2015-10-24 ENCOUNTER — Encounter: Payer: Self-pay | Admitting: Family Medicine

## 2015-10-24 VITALS — BP 110/72 | HR 82 | Temp 99.4°F | Resp 16 | Ht 60.0 in | Wt 140.2 lb

## 2015-10-24 DIAGNOSIS — J387 Other diseases of larynx: Secondary | ICD-10-CM

## 2015-10-24 DIAGNOSIS — K219 Gastro-esophageal reflux disease without esophagitis: Secondary | ICD-10-CM

## 2015-10-24 DIAGNOSIS — J45909 Unspecified asthma, uncomplicated: Secondary | ICD-10-CM

## 2015-10-24 DIAGNOSIS — J45998 Other asthma: Secondary | ICD-10-CM

## 2015-10-24 MED ORDER — RANITIDINE HCL 150 MG PO TABS: 150.0000 mg | ORAL_TABLET | Freq: Two times a day (BID) | ORAL | Status: DC

## 2015-10-24 MED ORDER — BENZONATATE 100 MG PO CAPS: 100.0000 mg | ORAL_CAPSULE | Freq: Three times a day (TID) | ORAL | Status: DC | PRN

## 2015-10-24 MED ORDER — BECLOMETHASONE DIPROPIONATE 40 MCG/ACT IN AERS: 2.0000 | INHALATION_SPRAY | Freq: Two times a day (BID) | RESPIRATORY_TRACT | Status: DC

## 2015-10-24 NOTE — Assessment & Plan Note (Signed)
Start QVAR and stop singulair; likely home environment with carpet; PFTs ordered, refer to pulmonologist if not getting better

## 2015-10-24 NOTE — Patient Instructions (Signed)
We'll start ranitidine 150 mg twice a day Elevate the head of your bed a little Start the QVAR and use twice a day every day Use the rescue inhaler if needed Stop the singulair We'll get pulmonary tests Use the cough medicine if needed Avoid any triggers

## 2015-10-24 NOTE — Progress Notes (Signed)
BP 110/72   Pulse 82   Temp 99.4 F (37.4 C) (Oral)   Resp 16   Ht 5' (1.524 m)   Wt 140 lb 3.2 oz (63.6 kg)   SpO2 94%   BMI 27.38 kg/m    98% within the fingernail polish  Subjective:    Patient ID: Hayley Hunt, female    DOB: 09/01/1990, 25 y.o.   MRN: [place]:[id number]  HPI: Hayley Hunt is a 25 y.o. female  Chief Complaint  Patient presents with  . Shortness of Breath    on and off for 2 months   She has been having shortness of breath and cough for 2 months; dry cough Two months, went to the ER, nebulizer treatment there and steroids and albuterol Then we added singulair, but it makes her tired She went to urgent care; they thought it was maybe an infection; if she didn't feel better after the antibiotic, then saw the ENT; she found him on Google; he thinks she has acid reflux, probably going on for years; always clears her throat and feels like she has a ball of spit that needs to come up; both parents have acid reflex; he did the mirror and stuck a light down her nose; she took the PPI for 30 days and then said to stop it because it can cause problems long-term More burping and burning without the PPI She uses the inhaler when she goes to the gym; the inhaler makes her lungs burn No hx of blood clot; no birth control Carpet in the home environment; dog, long-haired dog; dog sleeps with her, sleeping with her for 2 years; just got bad 2 months ago Slight sore throat Cut out the inhaler for the most part No spelunking, no cave exploration, no pet birds No work exposures No plug-in air fresheners  Chest xray Sep 01, 2015 CLINICAL DATA: 25 year old female with cough  EXAM: CHEST 2 VIEW  COMPARISON: Chest radiograph dated 08/27/2011  FINDINGS: The heart size and mediastinal contours are within normal limits. Both lungs are clear. The visualized skeletal structures are unremarkable.  IMPRESSION: No active cardiopulmonary disease.   Electronically  Signed  By: Anner Crete M.D.  On: 09/01/2015 22:37   Depression screen [facility] 2/9 10/24/2015 09/13/2015 07/29/2015 04/04/2015 11/22/2014  Decreased Interest 0 0 0 0 0  Down, Depressed, Hopeless 0 0 0 0 0  PHQ - 2 Score 0 0 0 0 0   Relevant past medical, surgical, family and social history reviewed Past Medical History:  Diagnosis Date  . Allergic asthma without acute exacerbation or status asthmaticus   . Asthma, mild 09/13/2015  . Common ichthyosis   . Eczema   . History of abnormal cervical Pap smear   . Ovarian cyst    History reviewed. No pertinent surgical history. Family History  Problem Relation Age of Onset  . Asthma Mother    Social History  Substance Use Topics  . Smoking status: Never Smoker  . Smokeless tobacco: Not on file  . Alcohol use No   Interim medical history since last visit reviewed. Allergies and medications reviewed  Review of Systems Per HPI unless specifically indicated above     Objective:    BP 110/72   Pulse 82   Temp 99.4 F (37.4 C) (Oral)   Resp 16   Ht 5' (1.524 m)   Wt 140 lb 3.2 oz (63.6 kg)   SpO2 94%   BMI 27.38 kg/m   Wt Readings from  Last 3 Encounters:  10/24/15 140 lb 3.2 oz (63.6 kg)  09/13/15 137 lb (62.1 kg)  09/01/15 137 lb (62.1 kg)    Physical Exam  Constitutional: She appears well-developed and well-nourished. No distress.  Eyes: EOM are normal. No scleral icterus.  Neck: No JVD present. No tracheal deviation present. No thyromegaly present.  Cardiovascular: Normal rate and regular rhythm.   Pulmonary/Chest: Effort normal and breath sounds normal. No respiratory distress. She has no wheezes. She has no rales.  Abdominal: She exhibits no distension.  Lymphadenopathy:    She has no cervical adenopathy.  Skin: No rash noted. She is not diaphoretic. No pallor.  Psychiatric: She has a normal mood and affect. Her behavior is normal. Judgment and thought content normal. Her mood appears not anxious.    Results for  orders placed or performed in visit on 07/29/15  Comprehensive metabolic panel  Result Value Ref Range   Glucose 96 65 - 99 mg/dL   BUN 13 6 - 20 mg/dL   Creatinine, Ser 0.75 0.57 - 1.00 mg/dL   GFR calc non Af Amer 111 >59 mL/min/1.73   GFR calc Af Amer 128 >59 mL/min/1.73   BUN/Creatinine Ratio 17 9 - 23   Sodium 139 134 - 144 mmol/L   Potassium 4.3 3.5 - 5.2 mmol/L   Chloride 98 96 - 106 mmol/L   CO2 25 18 - 29 mmol/L   Calcium 9.7 8.7 - 10.2 mg/dL   Total Protein 7.1 6.0 - 8.5 g/dL   Albumin 4.9 3.5 - 5.5 g/dL   Globulin, Total 2.2 1.5 - 4.5 g/dL   Albumin/Globulin Ratio 2.2 1.2 - 2.2   Bilirubin Total 0.4 0.0 - 1.2 mg/dL   Alkaline Phosphatase 74 39 - 117 IU/L   AST 17 0 - 40 IU/L   ALT 13 0 - 32 IU/L  Celiac Disease Ab Screen w/Rfx  Result Value Ref Range   Antigliadin Abs, IgA 1 0 - 19 units   Transglutaminase IgA <2 0 - 3 U/mL   IgA/Immunoglobulin A, Serum 118 87 - 352 mg/dL      Assessment & Plan:   Problem List Items Addressed This Visit      Respiratory   Laryngopharyngeal reflux (LPR)    ddx includes LPR with acid secretions spilling into the lungs, exacerbating breathing issues; will start H2 blocker BID in addition to the inhaler corticosteroid      Asthma, mild - Primary    Start QVAR and stop singulair; likely home environment with carpet; PFTs ordered, refer to pulmonologist if not getting better      Relevant Orders   Pulmonary Function Test [facility] Only (Completed)    Other Visit Diagnoses   None.     Follow up plan: Return in about 4 weeks (around 11/21/2015) for follow-up.  An after-visit summary was printed and given to the patient at [place].  Please see the patient instructions which may contain other information and recommendations beyond what is mentioned above in the assessment and plan.  Meds ordered this encounter  Medications  . ranitidine (ZANTAC) 150 MG tablet    Sig: Take 1 tablet (150 mg total) by mouth 2 (two) times daily.     Dispense:  60 tablet    Refill:  5  . DISCONTD: beclomethasone (QVAR) 40 MCG/ACT inhaler    Sig: Inhale 2 puffs into the lungs 2 (two) times daily. Every day; rinse mouth out after use    Dispense:  1 Inhaler  Refill:  12  . benzonatate (TESSALON PERLES) 100 MG capsule    Sig: Take 1 capsule (100 mg total) by mouth every 8 (eight) hours as needed for cough.    Dispense:  30 capsule    Refill:  0    Orders Placed This Encounter  Procedures  . Pulmonary Function Test [facility] Only

## 2015-11-03 ENCOUNTER — Ambulatory Visit: Payer: 59 | Attending: Family Medicine

## 2015-11-03 DIAGNOSIS — J45909 Unspecified asthma, uncomplicated: Secondary | ICD-10-CM | POA: Diagnosis not present

## 2015-11-03 MED ORDER — ALBUTEROL SULFATE (2.5 MG/3ML) 0.083% IN NEBU
2.5000 mg | INHALATION_SOLUTION | Freq: Once | RESPIRATORY_TRACT | Status: AC
  Administered 2015-11-03: 2.5 mg via RESPIRATORY_TRACT
  Filled 2015-11-03: qty 3

## 2015-11-04 NOTE — Progress Notes (Unsigned)
PFT

## 2015-12-09 ENCOUNTER — Other Ambulatory Visit: Payer: Self-pay | Admitting: Family Medicine

## 2015-12-10 MED ORDER — BECLOMETHASONE DIPROPIONATE 40 MCG/ACT IN AERS: 2.0000 | INHALATION_SPRAY | Freq: Two times a day (BID) | RESPIRATORY_TRACT | 3 refills | Status: DC

## 2015-12-16 ENCOUNTER — Telehealth: Payer: Self-pay | Admitting: Family Medicine

## 2015-12-16 MED ORDER — BECLOMETHASONE DIPROPIONATE 40 MCG/ACT IN AERS: 2.0000 | INHALATION_SPRAY | Freq: Two times a day (BID) | RESPIRATORY_TRACT | 3 refills | Status: DC

## 2015-12-16 NOTE — Telephone Encounter (Signed)
Yes, please let her know that I re-sent is and wrote "GENERIC PLEASE" on Rx and asked pharmacy to let me know if less expensive alternative

## 2015-12-19 NOTE — Telephone Encounter (Signed)
LMOM to inform pt °

## 2015-12-25 DIAGNOSIS — K219 Gastro-esophageal reflux disease without esophagitis: Secondary | ICD-10-CM | POA: Insufficient documentation

## 2015-12-25 NOTE — Assessment & Plan Note (Signed)
ddx includes LPR with acid secretions spilling into the lungs, exacerbating breathing issues; will start H2 blocker BID in addition to the inhaler corticosteroid

## 2016-01-12 ENCOUNTER — Telehealth: Payer: Self-pay | Admitting: Family Medicine

## 2016-01-12 NOTE — Telephone Encounter (Signed)
Pt notified, pharmacy called

## 2016-04-06 ENCOUNTER — Encounter: Payer: 59 | Admitting: Family Medicine

## 2016-04-17 ENCOUNTER — Encounter: Payer: Self-pay | Admitting: *Deleted

## 2016-04-17 ENCOUNTER — Emergency Department
Admission: EM | Admit: 2016-04-17 | Discharge: 2016-04-17 | Disposition: A | Discharge status: Home / Self Care | Payer: 59 | Attending: Emergency Medicine | Admitting: Emergency Medicine

## 2016-04-17 ENCOUNTER — Emergency Department: Payer: 59

## 2016-04-17 DIAGNOSIS — J45909 Unspecified asthma, uncomplicated: Secondary | ICD-10-CM | POA: Insufficient documentation

## 2016-04-17 DIAGNOSIS — R51 Headache: Secondary | ICD-10-CM | POA: Diagnosis present

## 2016-04-17 DIAGNOSIS — Z79899 Other long term (current) drug therapy: Secondary | ICD-10-CM | POA: Insufficient documentation

## 2016-04-17 DIAGNOSIS — G43809 Other migraine, not intractable, without status migrainosus: Secondary | ICD-10-CM | POA: Diagnosis not present

## 2016-04-17 MED ORDER — BUTALBITAL-APAP-CAFFEINE 50-325-40 MG PO TABS: 1.0000 | ORAL_TABLET | Freq: Four times a day (QID) | ORAL | 0 refills | Status: DC | PRN

## 2016-04-17 MED ORDER — METOCLOPRAMIDE HCL 10 MG PO TABS
10.0000 mg | ORAL_TABLET | Freq: Once | ORAL | Status: AC
  Administered 2016-04-17: 10 mg via ORAL
  Filled 2016-04-17 (×2): qty 1

## 2016-04-17 MED ORDER — ACETAMINOPHEN 500 MG PO TABS
1000.0000 mg | ORAL_TABLET | Freq: Once | ORAL | Status: AC
  Administered 2016-04-17: 1000 mg via ORAL
  Filled 2016-04-17: qty 2

## 2016-04-17 NOTE — ED Notes (Signed)
Pt alert and oriented X4, active, cooperative, pt in NAD. RR even and unlabored, color WNL.  Pt informed to return if any life threatening symptoms occur.   

## 2016-04-17 NOTE — ED Triage Notes (Signed)
States a headache that began today, states she had a period of tingling in her right hand which has since resolved, states hx of migranes in the past, awake and alert in no acute distress

## 2016-04-17 NOTE — ED Provider Notes (Signed)
[facility] Emergency Department Provider Note        Time seen: ----------------------------------------- 2:43 PM on 04/17/2016 -----------------------------------------    I have reviewed the triage vital signs and the nursing notes.   HISTORY  Chief Complaint Headache    HPI Hayley Hunt is a 25 y.o. female who presents ER for headache that began today. Patient states while she was driving she experienced a period of tingling in her right hand in a stocking and glove distribution which is since resolved. She does have a history of migraines but has never had numbness like that before. She states the numbness has resolved but she still has some headache. She denies recent illness, fevers, chills or other complaints.Headache is in the front, she took ibuprofen which has mildly helped her symptoms.   Past Medical History:  Diagnosis Date  . Allergic asthma without acute exacerbation or status asthmaticus   . Asthma, mild 09/13/2015  . Common ichthyosis   . Eczema   . History of abnormal cervical Pap smear   . Ovarian cyst     Patient Active Problem List   Diagnosis Date Noted  . Laryngopharyngeal reflux (LPR) 12/25/2015  . Asthma, mild 09/13/2015  . Abdominal bloating 07/29/2015  . Postprandial abdominal bloating 07/29/2015  . Change in stool habits 07/29/2015  . Intestinal gas excretion 07/29/2015  . Abdominal pain, lower 07/29/2015  . Annual physical exam 04/04/2015  . Encounter for immunization 04/04/2015  . Encounter for screening for malignant neoplasm of cervix 04/04/2015  . Allergic rhinitis 11/22/2014  . Dermatitis, eczematoid 11/22/2014  . H/O abnormal cervical Papanicolaou smear 11/22/2014  . Wrist pain, right 11/22/2014    History reviewed. No pertinent surgical history.  Allergies Penicillins and Latex  Social History Social History  Substance Use Topics  . Smoking status: Never Smoker  . Smokeless tobacco: Not on file   . Alcohol use No    Review of Systems Constitutional: Negative for fever. Cardiovascular: Negative for chest pain. Respiratory: Negative for shortness of breath. Gastrointestinal: Negative for abdominal pain, vomiting and diarrhea. Genitourinary: Negative for dysuria. Musculoskeletal: Negative for back pain. Skin: Negative for rash. Neurological: Positive for headache  10-point ROS otherwise negative.  ____________________________________________   PHYSICAL EXAM:  VITAL SIGNS: ED Triage Vitals  Enc Vitals Group     BP 04/17/16 1223 108/67     Pulse Rate 04/17/16 1223 76     Resp 04/17/16 1223 18     Temp 04/17/16 1223 98.1 F (36.7 C)     Temp Source 04/17/16 1223 Oral     SpO2 04/17/16 1223 100 %     Weight 04/17/16 1224 135 lb (61.2 kg)     Height 04/17/16 1224 5' (1.524 m)     Head Circumference --      Peak Flow --      Pain Score 04/17/16 1224 3     Pain Loc --      Pain Edu? --      Excl. in [place]? --     Constitutional: Alert and oriented. Well appearing and in no distress. Eyes: Conjunctivae are normal. PERRL. Normal extraocular movements. ENT   Head: Normocephalic and atraumatic.   Nose: No congestion/rhinnorhea.   Mouth/Throat: Mucous membranes are moist.   Neck: No stridor. Cardiovascular: Normal rate, regular rhythm. No murmurs, rubs, or gallops. Respiratory: Normal respiratory effort without tachypnea nor retractions. Breath sounds are clear and equal bilaterally. No wheezes/rales/rhonchi. Gastrointestinal: Soft and nontender. Normal bowel sounds Musculoskeletal:  Nontender with normal range of motion in all extremities. No lower extremity tenderness nor edema. Neurologic:  Normal speech and language. No gross focal neurologic deficits are appreciated. Strength, sensation, cranial nerves appear to be intact. Skin:  Skin is warm, dry and intact. No rash noted. Psychiatric: Mood and affect are normal. Speech and behavior are normal.   ____________________________________________  ED COURSE:  Pertinent labs & imaging results that were available during my care of the patient were reviewed by me and considered in my medical decision making (see chart for details). Clinical Course   Patient presents to the ER likely with migraine symptoms. We will assess with CT imaging and provide oral headache medicine.  Procedures ____________________________________________   RADIOLOGY  CT head Is unremarkable ____________________________________________  FINAL ASSESSMENT AND PLAN  Headache  Plan: Patient with imaging as dictated above. Patient is in no distress and is neurologically normal at this time. This is likely a migraine variant. She'll be discharged with Fioricet and encouraged to follow up for recheck.   Earleen Newport, MD   Note: This dictation was prepared with Dragon dictation. Any transcriptional errors that result from this process are unintentional    Earleen Newport, MD 04/17/16 1517

## 2016-04-17 NOTE — ED Notes (Signed)
Pt to ed with c/o headache that began today about 2 1/2 hours prior to arrival, states she had a 10 min period of numbness in her right hand that radiated up into forearm which has since resolved, hx of migranes.  Per pt, took 600mg  IBU without relief for headache. Pt with good equal grip strength in bilat upper ext.  Denies blurred vision.  Denies numbness in right hand now. Pt alert and oriented at this time.

## 2016-04-17 NOTE — ED Notes (Signed)
Pt back from CT scan

## 2016-04-27 ENCOUNTER — Encounter: Payer: Self-pay | Admitting: Family Medicine

## 2016-04-27 ENCOUNTER — Ambulatory Visit (INDEPENDENT_AMBULATORY_CARE_PROVIDER_SITE_OTHER): Payer: 59 | Admitting: Family Medicine

## 2016-04-27 VITALS — BP 108/64 | HR 67 | Temp 98.8°F | Resp 14 | Ht 60.7 in | Wt 138.0 lb

## 2016-04-27 DIAGNOSIS — R5383 Other fatigue: Secondary | ICD-10-CM | POA: Diagnosis not present

## 2016-04-27 DIAGNOSIS — Z87898 Personal history of other specified conditions: Secondary | ICD-10-CM | POA: Diagnosis not present

## 2016-04-27 DIAGNOSIS — N898 Other specified noninflammatory disorders of vagina: Secondary | ICD-10-CM | POA: Diagnosis not present

## 2016-04-27 DIAGNOSIS — N889 Noninflammatory disorder of cervix uteri, unspecified: Secondary | ICD-10-CM

## 2016-04-27 DIAGNOSIS — Z0001 Encounter for general adult medical examination with abnormal findings: Secondary | ICD-10-CM

## 2016-04-27 DIAGNOSIS — Z0000 Encounter for general adult medical examination without abnormal findings: Secondary | ICD-10-CM | POA: Diagnosis not present

## 2016-04-27 DIAGNOSIS — Z8742 Personal history of other diseases of the female genital tract: Secondary | ICD-10-CM

## 2016-04-27 NOTE — Progress Notes (Signed)
Patient ID: Hayley Hunt, female   DOB: 10-Aug-1990, 26 y.o.   MRN: [id number]   Subjective:   Hayley Hunt is a 26 y.o. female here for a complete physical exam  Interim issues since last visit: started getting migraines; went to urgent care; arm went numb and they sent her to ER; did CT scan of head; just migraines; saw an aura yesterday; checked out at ER; mother got migraines with pregnancy,and patient's might have had relation to hormones  USPSTF grade A and B recommendations Alcohol: no Depression:  Depression screen [facility] 2/9 04/27/2016 10/24/2015 09/13/2015 07/29/2015 04/04/2015  Decreased Interest 0 0 0 0 0  Down, Depressed, Hopeless 0 0 0 0 0  PHQ - 2 Score 0 0 0 0 0    Hypertension: gorgeous Obesity: no Tobacco use: no HIV, hep B, hep C: declined STD testing and prevention (chl/gon/syphilis): declined Lipids: check today, granola bar within six hours Glucose: check today Colorectal cancer: no fam hx Breast cancer: no lumps BRCA gene screening: no breast/ovarian can in fam Intimate partner violence: no Cervical cancer screening: today Lung cancer: n/a Osteoporosis: n/a Fall prevention/vitamin D: energy level low lately AAA: n/a Aspirin: n/a Diet: room for improvement Exercise: regular, 4x a week Skin cancer: sees dermatologist, Dr. Juel Burrow PA  Has hx of abnormal pap smear; had biopsy, goes back and forth; HPV cells present; she has not been back there  Past Medical History:  Diagnosis Date  . Allergic asthma without acute exacerbation or status asthmaticus   . Asthma, mild 09/13/2015  . Common ichthyosis   . Eczema   . History of abnormal cervical Pap smear   . Ovarian cyst    History reviewed. No pertinent surgical history.  MD note: colposcopy with cervical biopsies  Family History  Problem Relation Age of Onset  . Asthma Mother   . Hypothyroidism Mother   . Breast cancer Neg Hx   . Ovarian cancer Neg Hx   MD note: mother has thyroid d/o  Social History   Substance Use Topics  . Smoking status: Never Smoker  . Smokeless tobacco: Never Used  . Alcohol use No   Review of Systems  Objective:   Vitals:   04/27/16 1355  BP: 108/64  Pulse: 67  Resp: 14  Temp: 98.8 F (37.1 C)  TempSrc: Oral  SpO2: 96%  Weight: 138 lb (62.6 kg)  Height: 5' 0.7" (1.542 m)   Body mass index is 26.33 kg/m. Wt Readings from Last 3 Encounters:  04/27/16 138 lb (62.6 kg)  04/17/16 135 lb (61.2 kg)  10/24/15 140 lb 3.2 oz (63.6 kg)   Physical Exam  Constitutional: She appears well-developed and well-nourished.  HENT:  Head: Normocephalic and atraumatic.  Eyes: Conjunctivae and EOM are normal. Right eye exhibits no hordeolum. Left eye exhibits no hordeolum. No scleral icterus.  Neck: Carotid bruit is not present. No thyromegaly present.  Cardiovascular: Normal rate, regular rhythm, S1 normal, S2 normal and normal heart sounds.   No extrasystoles are present.  Pulmonary/Chest: Effort normal and breath sounds normal. No respiratory distress. Right breast exhibits no inverted nipple, no mass, no nipple discharge, no skin change and no tenderness. Left breast exhibits no inverted nipple, no mass, no nipple discharge, no skin change and no tenderness. Breasts are symmetrical.  Abdominal: Soft. Normal appearance and bowel sounds are normal. She exhibits no distension, no abdominal bruit, no pulsatile midline mass and no mass. There is no hepatosplenomegaly. There is no tenderness. No hernia.  Genitourinary: Uterus normal. Pelvic exam was performed with patient prone. There is no rash or lesion on the right labia. There is no rash or lesion on the left labia. Uterus is not tender. Cervix exhibits friability. Cervix exhibits no motion tenderness and no discharge. Right adnexum displays no mass, no tenderness and no fullness. Left adnexum displays no mass, no tenderness and no fullness. No erythema, tenderness or bleeding in the vagina. Vaginal discharge (scant) found.     Musculoskeletal: Normal range of motion. She exhibits no edema.  Lymphadenopathy:       Head (right side): No submandibular adenopathy present.       Head (left side): No submandibular adenopathy present.    She has no cervical adenopathy.    She has no axillary adenopathy.  Neurological: She is alert. She displays no tremor. No cranial nerve deficit. She exhibits normal muscle tone. Gait normal.  Skin: Skin is warm and dry. No bruising and no ecchymosis noted. No cyanosis. No pallor.  Very dry skin  Psychiatric: Her speech is normal and behavior is normal. Thought content normal. Her mood appears not anxious. She does not exhibit a depressed mood.    Assessment/Plan:   Problem List Items Addressed This Visit      Genitourinary   Abnormal appearance of cervix    With abnormal pap smear, hx of LGSIL; refer back to GYN      Relevant Orders   Ambulatory referral to Gynecology     Other   Preventative health care - Primary    USPSTF grade A and B recommendations reviewed with patient; age-appropriate recommendations, preventive care, screening tests, etc discussed and encouraged; healthy living encouraged; see AVS for patient education given to patient       Relevant Orders   Lipid panel (Completed)   CBC with Differential/Platelet (Completed)   Comprehensive Metabolic Panel (CMET) (Completed)   TSH (Completed)   Pap IG, CT/NG w/ reflex HPV when ASC-U   H/O abnormal cervical Papanicolaou smear    Repeat pap today; because of abnormal appearance of cervix, refer back to gyn      Relevant Orders   Pap IG, CT/NG w/ reflex HPV when ASC-U   Ambulatory referral to Gynecology   Fatigue    Check vit D, TSH, CBC      Relevant Orders   VITAMIN D 25 Hydroxy (Vit-D Deficiency, Fractures) (Completed)    Other Visit Diagnoses    Vaginal discharge       Relevant Orders   WET PREP FOR [place], YEAST, CLUE       No orders of the defined types were placed in this  encounter.  Orders Placed This Encounter  Procedures  . WET PREP FOR [place], YEAST, CLUE  . Lipid panel  . CBC with Differential/Platelet  . Comprehensive Metabolic Panel (CMET)  . TSH  . VITAMIN D 25 Hydroxy (Vit-D Deficiency, Fractures)  . Ambulatory referral to Gynecology    Referral Priority:   Routine    Referral Type:   Consultation    Referral Reason:   Specialty Services Required    Requested Specialty:   Gynecology    Number of Visits Requested:   1    Follow up plan: Return in about 1 year (around 04/27/2017) for complete physical; call sooner if needed.  An After Visit Summary was printed and given to the patient.

## 2016-04-27 NOTE — Assessment & Plan Note (Addendum)
Repeat pap today; because of abnormal appearance of cervix, refer back to gyn

## 2016-04-27 NOTE — Patient Instructions (Addendum)
Try 250 mg of OTC magnesium daily Try calcium chews 500 or 600 mg twice a day one week prior to period and through period  You received the flu shot today; it should protect you against the flu virus over the coming months; it will take about two weeks for antibodies to develop; do try to stay away from hospitals, nursing homes, and daycares during peak flu season; taking extra vitamin C daily during flu season may help you avoid getting sick  Please get labs If you have not heard anything from my staff in a week about any orders/referrals/studies from today, please contact us here to follow-up [phone]  Health Maintenance, Female Introduction Adopting a healthy lifestyle and getting preventive care can go a long way to promote health and wellness. Talk with your health care provider about what schedule of regular examinations is right for you. This is a good chance for you to check in with your provider about disease prevention and staying healthy. In between checkups, there are plenty of things you can do on your own. Experts have done a lot of research about which lifestyle changes and preventive measures are most likely to keep you healthy. Ask your health care provider for more information. Weight and diet Eat a healthy diet  Be sure to include plenty of vegetables, fruits, low-fat dairy products, and lean protein.  Do not eat a lot of foods high in solid fats, added sugars, or salt.  Get regular exercise. This is one of the most important things you can do for your health.  Most adults should exercise for at least 150 minutes each week. The exercise should increase your heart rate and make you sweat (moderate-intensity exercise).  Most adults should also do strengthening exercises at least twice a week. This is in addition to the moderate-intensity exercise. Maintain a healthy weight  Body mass index (BMI) is a measurement that can be used to identify possible weight problems.  It estimates body fat based on height and weight. Your health care provider can help determine your BMI and help you achieve or maintain a healthy weight.  For females 51 years of age and older:  A BMI below 18.5 is considered underweight.  A BMI of 18.5 to 24.9 is normal.  A BMI of 25 to 29.9 is considered overweight.  A BMI of 30 and above is considered obese. Watch levels of cholesterol and blood lipids  You should start having your blood tested for lipids and cholesterol at 26 years of age, then have this test every 5 years.  You may need to have your cholesterol levels checked more often if:  Your lipid or cholesterol levels are high.  You are older than 27 years of age.  You are at high risk for heart disease. Cancer screening Lung Cancer  Lung cancer screening is recommended for adults 42-66 years old who are at high risk for lung cancer because of a history of smoking.  A yearly low-dose CT scan of the lungs is recommended for people who:  Currently smoke.  Have quit within the past 15 years.  Have at least a 30-pack-year history of smoking. A pack year is smoking an average of one pack of cigarettes a day for 1 year.  Yearly screening should continue until it has been 15 years since you quit.  Yearly screening should stop if you develop a health problem that would prevent you from having lung cancer treatment. Breast Cancer  Practice breast self-awareness.  This means understanding how your breasts normally appear and feel.  It also means doing regular breast self-exams. Let your health care provider know about any changes, no matter how small.  If you are in your 20s or 30s, you should have a clinical breast exam (CBE) by a health care provider every 1-3 years as part of a regular health exam.  If you are 61 or older, have a CBE every year. Also consider having a breast X-ray (mammogram) every year.  If you have a family history of breast cancer, talk to your  health care provider about genetic screening.  If you are at high risk for breast cancer, talk to your health care provider about having an MRI and a mammogram every year.  Breast cancer gene (BRCA) assessment is recommended for women who have family members with BRCA-related cancers. BRCA-related cancers include:  Breast.  Ovarian.  Tubal.  Peritoneal cancers.  Results of the assessment will determine the need for genetic counseling and BRCA1 and BRCA2 testing. Cervical Cancer  Your health care provider may recommend that you be screened regularly for cancer of the pelvic organs (ovaries, uterus, and vagina). This screening involves a pelvic examination, including checking for microscopic changes to the surface of your cervix (Pap test). You may be encouraged to have this screening done every 3 years, beginning at age 74.  For women ages 79-65, health care providers may recommend pelvic exams and Pap testing every 3 years, or they may recommend the Pap and pelvic exam, combined with testing for human papilloma virus (HPV), every 5 years. Some types of HPV increase your risk of cervical cancer. Testing for HPV may also be done on women of any age with unclear Pap test results.  Other health care providers may not recommend any screening for nonpregnant women who are considered low risk for pelvic cancer and who do not have symptoms. Ask your health care provider if a screening pelvic exam is right for you.  If you have had past treatment for cervical cancer or a condition that could lead to cancer, you need Pap tests and screening for cancer for at least 20 years after your treatment. If Pap tests have been discontinued, your risk factors (such as having a new sexual partner) need to be reassessed to determine if screening should resume. Some women have medical problems that increase the chance of getting cervical cancer. In these cases, your health care provider may recommend more frequent  screening and Pap tests. Colorectal Cancer  This type of cancer can be detected and often prevented.  Routine colorectal cancer screening usually begins at 27 years of age and continues through 27 years of age.  Your health care provider may recommend screening at an earlier age if you have risk factors for colon cancer.  Your health care provider may also recommend using home test kits to check for hidden blood in the stool.  A small camera at the end of a tube can be used to examine your colon directly (sigmoidoscopy or colonoscopy). This is done to check for the earliest forms of colorectal cancer.  Routine screening usually begins at age 60.  Direct examination of the colon should be repeated every 5-10 years through 27 years of age. However, you may need to be screened more often if early forms of precancerous polyps or small growths are found. Skin Cancer  Check your skin from head to toe regularly.  Tell your health care provider about any new moles  or changes in moles, especially if there is a change in a mole's shape or color.  Also tell your health care provider if you have a mole that is larger than the size of a pencil eraser.  Always use sunscreen. Apply sunscreen liberally and repeatedly throughout the day.  Protect yourself by wearing long sleeves, pants, a wide-brimmed hat, and sunglasses whenever you are outside. Heart disease, diabetes, and high blood pressure  High blood pressure causes heart disease and increases the risk of stroke. High blood pressure is more likely to develop in:  People who have blood pressure in the high end of the normal range (130-139/85-89 mm Hg).  People who are overweight or obese.  People who are African American.  If you are 47-25 years of age, have your blood pressure checked every 3-5 years. If you are 16 years of age or older, have your blood pressure checked every year. You should have your blood pressure measured twice-once  when you are at a hospital or clinic, and once when you are not at a hospital or clinic. Record the average of the two measurements. To check your blood pressure when you are not at a hospital or clinic, you can use:  An automated blood pressure machine at a pharmacy.  A home blood pressure monitor.  If you are between 79 years and 6 years old, ask your health care provider if you should take aspirin to prevent strokes.  Have regular diabetes screenings. This involves taking a blood sample to check your fasting blood sugar level.  If you are at a normal weight and have a low risk for diabetes, have this test once every three years after 27 years of age.  If you are overweight and have a high risk for diabetes, consider being tested at a younger age or more often. Preventing infection Hepatitis B  If you have a higher risk for hepatitis B, you should be screened for this virus. You are considered at high risk for hepatitis B if:  You were born in a country where hepatitis B is common. Ask your health care provider which countries are considered high risk.  Your parents were born in a high-risk country, and you have not been immunized against hepatitis B (hepatitis B vaccine).  You have HIV or AIDS.  You use needles to inject street drugs.  You live with someone who has hepatitis B.  You have had sex with someone who has hepatitis B.  You get hemodialysis treatment.  You take certain medicines for conditions, including cancer, organ transplantation, and autoimmune conditions. Hepatitis C  Blood testing is recommended for:  Everyone born from [birth year] through 1966.  Anyone with known risk factors for hepatitis C. Sexually transmitted infections (STIs)  You should be screened for sexually transmitted infections (STIs) including gonorrhea and chlamydia if:  You are sexually active and are younger than 27 years of age.  You are older than 27 years of age and your health care  provider tells you that you are at risk for this type of infection.  Your sexual activity has changed since you were last screened and you are at an increased risk for chlamydia or gonorrhea. Ask your health care provider if you are at risk.  If you do not have HIV, but are at risk, it may be recommended that you take a prescription medicine daily to prevent HIV infection. This is called pre-exposure prophylaxis (PrEP). You are considered at risk if:  You are  sexually active and do not regularly use condoms or know the HIV status of your partner(s).  You take drugs by injection.  You are sexually active with a partner who has HIV. Talk with your health care provider about whether you are at high risk of being infected with HIV. If you choose to begin PrEP, you should first be tested for HIV. You should then be tested every 3 months for as long as you are taking PrEP. Pregnancy  If you are premenopausal and you may become pregnant, ask your health care provider about preconception counseling.  If you may become pregnant, take 400 to 800 micrograms (mcg) of folic acid every day.  If you want to prevent pregnancy, talk to your health care provider about birth control (contraception). Osteoporosis and menopause  Osteoporosis is a disease in which the bones lose minerals and strength with aging. This can result in serious bone fractures. Your risk for osteoporosis can be identified using a bone density scan.  If you are 43 years of age or older, or if you are at risk for osteoporosis and fractures, ask your health care provider if you should be screened.  Ask your health care provider whether you should take a calcium or vitamin D supplement to lower your risk for osteoporosis.  Menopause may have certain physical symptoms and risks.  Hormone replacement therapy may reduce some of these symptoms and risks. Talk to your health care provider about whether hormone replacement therapy is right  for you. Follow these instructions at home:  Schedule regular health, dental, and eye exams.  Stay current with your immunizations.  Do not use any tobacco products including cigarettes, chewing tobacco, or electronic cigarettes.  If you are pregnant, do not drink alcohol.  If you are breastfeeding, limit how much and how often you drink alcohol.  Limit alcohol intake to no more than 1 drink per day for nonpregnant women. One drink equals 12 ounces of beer, 5 ounces of wine, or 1 ounces of hard liquor.  Do not use street drugs.  Do not share needles.  Ask your health care provider for help if you need support or information about quitting drugs.  Tell your health care provider if you often feel depressed.  Tell your health care provider if you have ever been abused or do not feel safe at home. This information is not intended to replace advice given to you by your health care provider. Make sure you discuss any questions you have with your health care provider. Document Released: 10/23/2010 Document Revised: 09/15/2015 Document Reviewed: 01/11/2015  2017 [employer]

## 2016-04-27 NOTE — Assessment & Plan Note (Signed)
USPSTF grade A and B recommendations reviewed with patient; age-appropriate recommendations, preventive care, screening tests, etc discussed and encouraged; healthy living encouraged; see AVS for patient education given to patient  

## 2016-04-27 NOTE — Assessment & Plan Note (Signed)
Check vit D, TSH, CBC

## 2016-04-28 LAB — COMPREHENSIVE METABOLIC PANEL
ALT: 26 IU/L (ref 0–32)
AST: 18 IU/L (ref 0–40)
Albumin/Globulin Ratio: 1.9 (ref 1.2–2.2)
Albumin: 4.4 g/dL (ref 3.5–5.5)
Alkaline Phosphatase: 71 IU/L (ref 39–117)
BUN/Creatinine Ratio: 15 (ref 9–23)
BUN: 12 mg/dL (ref 6–20)
Bilirubin Total: 0.3 mg/dL (ref 0.0–1.2)
CO2: 23 mmol/L (ref 18–29)
Calcium: 9.1 mg/dL (ref 8.7–10.2)
Chloride: 101 mmol/L (ref 96–106)
Creatinine, Ser: 0.8 mg/dL (ref 0.57–1.00)
GFR calc Af Amer: 118 mL/min/{1.73_m2} (ref >59)
GFR calc non Af Amer: 102 mL/min/{1.73_m2} (ref >59)
Globulin, Total: 2.3 g/dL (ref 1.5–4.5)
Glucose: 82 mg/dL (ref 65–99)
Potassium: 4.1 mmol/L (ref 3.5–5.2)
Sodium: 141 mmol/L (ref 134–144)
Total Protein: 6.7 g/dL (ref 6.0–8.5)

## 2016-04-28 LAB — LIPID PANEL
Chol/HDL Ratio: 4 ratio units (ref 0.0–4.4)
Cholesterol, Total: 199 mg/dL (ref 100–199)
HDL: 50 mg/dL (ref >39)
LDL Calculated: 133 mg/dL — ABNORMAL HIGH (ref 0–99)
Triglycerides: 79 mg/dL (ref 0–149)
VLDL Cholesterol Cal: 16 mg/dL (ref 5–40)

## 2016-04-28 LAB — CBC WITH DIFFERENTIAL/PLATELET
Basophils Absolute: 0.1 10*3/uL (ref 0.0–0.2)
Basos: 1 %
EOS (ABSOLUTE): 0.3 10*3/uL (ref 0.0–0.4)
Eos: 5 %
Hematocrit: 39.6 % (ref 34.0–46.6)
Hemoglobin: 13.8 g/dL (ref 11.1–15.9)
Immature Grans (Abs): 0 10*3/uL (ref 0.0–0.1)
Immature Granulocytes: 0 %
Lymphocytes Absolute: 2.5 10*3/uL (ref 0.7–3.1)
Lymphs: 36 %
MCH: 30.1 pg (ref 26.6–33.0)
MCHC: 34.8 g/dL (ref 31.5–35.7)
MCV: 86 fL (ref 79–97)
Monocytes Absolute: 0.5 10*3/uL (ref 0.1–0.9)
Monocytes: 8 %
Neutrophils Absolute: 3.4 10*3/uL (ref 1.4–7.0)
Neutrophils: 50 %
Platelets: 276 10*3/uL (ref 150–379)
RBC: 4.59 x10E6/uL (ref 3.77–5.28)
RDW: 13.2 % (ref 12.3–15.4)
WBC: 6.9 10*3/uL (ref 3.4–10.8)

## 2016-04-28 LAB — VITAMIN D 25 HYDROXY (VIT D DEFICIENCY, FRACTURES): Vit D, 25-Hydroxy: 23.5 ng/mL — ABNORMAL LOW (ref 30.0–100.0)

## 2016-04-28 LAB — TSH: TSH: 3.83 u[IU]/mL (ref 0.450–4.500)

## 2016-04-29 DIAGNOSIS — N889 Noninflammatory disorder of cervix uteri, unspecified: Secondary | ICD-10-CM | POA: Insufficient documentation

## 2016-04-29 NOTE — Assessment & Plan Note (Signed)
With abnormal pap smear, hx of LGSIL; refer back to GYN

## 2016-04-30 LAB — WET PREP FOR TRICH, YEAST, CLUE

## 2016-04-30 LAB — VAGINITIS/VAGINOSIS, DNA PROBE
Candida Species: NEGATIVE
Gardnerella vaginalis: NEGATIVE
Trichomonas vaginosis: NEGATIVE

## 2016-04-30 LAB — PAP IG, CT-NG, RFX HPV ASCU
Chlamydia, Nuc. Acid Amp: NEGATIVE
Gonococcus by Nucleic Acid Amp: NEGATIVE
PAP Smear Comment: 0

## 2016-04-30 LAB — TEST CODE CHANGE

## 2016-04-30 LAB — SPECIMEN STATUS REPORT

## 2016-05-04 DIAGNOSIS — Z1283 Encounter for screening for malignant neoplasm of skin: Secondary | ICD-10-CM | POA: Diagnosis not present

## 2016-05-04 DIAGNOSIS — Q808 Other congenital ichthyosis: Secondary | ICD-10-CM | POA: Diagnosis not present

## 2016-05-11 ENCOUNTER — Encounter: Payer: Self-pay | Admitting: Family Medicine

## 2016-05-18 DIAGNOSIS — N72 Inflammatory disease of cervix uteri: Secondary | ICD-10-CM | POA: Diagnosis not present

## 2016-05-18 DIAGNOSIS — R350 Frequency of micturition: Secondary | ICD-10-CM | POA: Diagnosis not present

## 2016-06-23 ENCOUNTER — Encounter: Payer: Self-pay | Admitting: Family Medicine

## 2016-06-26 ENCOUNTER — Encounter: Payer: 59 | Admitting: Obstetrics and Gynecology

## 2016-07-30 DIAGNOSIS — J019 Acute sinusitis, unspecified: Secondary | ICD-10-CM | POA: Diagnosis not present

## 2016-08-07 DIAGNOSIS — J3089 Other allergic rhinitis: Secondary | ICD-10-CM | POA: Diagnosis not present

## 2016-08-07 DIAGNOSIS — J4530 Mild persistent asthma, uncomplicated: Secondary | ICD-10-CM | POA: Diagnosis not present

## 2016-08-07 DIAGNOSIS — J301 Allergic rhinitis due to pollen: Secondary | ICD-10-CM | POA: Diagnosis not present

## 2016-08-07 DIAGNOSIS — J3081 Allergic rhinitis due to animal (cat) (dog) hair and dander: Secondary | ICD-10-CM | POA: Diagnosis not present

## 2016-08-28 ENCOUNTER — Ambulatory Visit (INDEPENDENT_AMBULATORY_CARE_PROVIDER_SITE_OTHER): Payer: 59 | Admitting: Family Medicine

## 2016-08-28 ENCOUNTER — Encounter: Payer: Self-pay | Admitting: Family Medicine

## 2016-08-28 VITALS — BP 106/74 | HR 99 | Temp 98.7°F | Resp 16 | Wt 138.9 lb

## 2016-08-28 DIAGNOSIS — Z23 Encounter for immunization: Secondary | ICD-10-CM

## 2016-08-28 DIAGNOSIS — N6489 Other specified disorders of breast: Secondary | ICD-10-CM

## 2016-08-28 DIAGNOSIS — N889 Noninflammatory disorder of cervix uteri, unspecified: Secondary | ICD-10-CM

## 2016-08-28 NOTE — Progress Notes (Signed)
BP 106/74   Pulse 99   Temp 98.7 F (37.1 C) (Oral)   Resp 16   Wt 138 lb 14.4 oz (63 kg)   LMP 07/28/2016   SpO2 98%   BMI 26.50 kg/m    Subjective:    Patient ID: Hayley Hunt, female    DOB: 06-19-1990, 26 y.o.   MRN: [id number]  HPI: Hayley Hunt is a 26 y.o. female  Chief Complaint  Patient presents with  . Breast Problem    right nipple dryness   HPI Here for evaluation of right nipple cracking; does not bleed She got on line and thought it was friction from working out; has good supportive bra She says it looks better some days; red in color, but nothing comes out No pain, no discharge, no lumps No fam hx of breast cancer Using tanning lotion but sparing nipples Has been using wet washcloth just one time Has ichthyosis on legs, covers with tanning lotion Not doing tanning beds Purposeful weight loss; trying but not as successful as she'd like Periods are being regulated by birth control; just started; seeing gyn for that; [facility]; she was sent there for abnormal appearance to cervix, so they evaluate that  Depression screen [facility] 2/9 08/28/2016 04/27/2016 10/24/2015 09/13/2015 07/29/2015  Decreased Interest 0 0 0 0 0  Down, Depressed, Hopeless 0 0 0 0 0  PHQ - 2 Score 0 0 0 0 0   Relevant past medical, surgical, family and social history reviewed Past Medical History:  Diagnosis Date  . Allergic asthma without acute exacerbation or status asthmaticus   . Asthma, mild 09/13/2015  . Common ichthyosis   . Eczema   . History of abnormal cervical Pap smear   . Ovarian cyst    No past surgical history on file.   family history includes Asthma in her mother; Cancer in her paternal grandfather; Heart attack in her maternal grandfather; Heart disease in her maternal grandfather; Hypothyroidism in her mother; Osteoporosis in her maternal grandmother. There is no history of Breast cancer or Ovarian cancer.   Social History   Social History  . Marital status: Single   Spouse name: N/A  . Number of children: N/A  . Years of education: N/A   Occupational History  . Not on file.   Social History Main Topics  . Smoking status: Never Smoker  . Smokeless tobacco: Never Used  . Alcohol use No  . Drug use: No  . Sexual activity: Yes    Partners: Male    Birth control/ protection: Condom   Other Topics Concern  . Not on file   Social History Narrative  . No narrative on file   Interim medical history since last visit reviewed. Allergies and medications reviewed  Review of Systems Per HPI unless specifically indicated above     Objective:    BP 106/74   Pulse 99   Temp 98.7 F (37.1 C) (Oral)   Resp 16   Wt 138 lb 14.4 oz (63 kg)   LMP 07/28/2016   SpO2 98%   BMI 26.50 kg/m   Wt Readings from Last 3 Encounters:  08/28/16 138 lb 14.4 oz (63 kg)  04/27/16 138 lb (62.6 kg)  04/17/16 135 lb (61.2 kg)    Physical Exam  Constitutional: She appears well-developed and well-nourished.  Cardiovascular: Normal rate.   Pulmonary/Chest: Effort normal. Right breast exhibits skin change. Right breast exhibits no inverted nipple, no mass, no nipple discharge and no  tenderness. Left breast exhibits no inverted nipple, no mass, no nipple discharge, no skin change and no tenderness. Breasts are symmetrical.    Dry skin with slight scabbing at the nipple, lower half primarily; no discharge, no lumps  Skin:  Tanning lotion / discoloration on skin, blotchy; dry skin  Psychiatric: Her mood appears anxious. She does not exhibit a depressed mood.      Assessment & Plan:   Problem List Items Addressed This Visit      Genitourinary   Abnormal appearance of cervix    Seen by GYN; will request notes       Other Visit Diagnoses    Nipple crusting    -  Primary   may be friction, excessive keritinization related to her ichthyosis; will have her use gentle washcloth, moisturize; call me in 2 weeks; to surg if not 100%gone   Need for Tdap vaccination        offered and given today   Relevant Orders   Tdap vaccine greater than or equal to 7yo IM (Completed)       Follow up plan: No Follow-up on file.  An after-visit summary was printed and given to the patient at [place].  Please see the patient instructions which may contain other information and recommendations beyond what is mentioned above in the assessment and plan.  Meds ordered this encounter  Medications  . BLISOVI 24 FE 1-20 MG-MCG(24) tablet    Sig: Take 1 tablet by mouth daily.  . ARNUITY ELLIPTA 100 MCG/ACT AEPB    Sig: Inhale 1 puff into the lungs daily.    Refill:  0    Orders Placed This Encounter  Procedures  . Tdap vaccine greater than or equal to 7yo IM

## 2016-08-28 NOTE — Assessment & Plan Note (Addendum)
Seen by GYN; will request notes

## 2016-08-28 NOTE — Patient Instructions (Addendum)
Try wet washcloth, gently wipe the nipple and areola every day Use over-the-counter cortisone cream mixed half and half with lubricating lotion like Aveeno, just a little bit once a day Call me in two weeks and if not 100% better, we'll have you see a surgeon about your breast You received the vaccine to protect against tetanus and diphtheria and pertussis today; the tetanus and diphtheria portions will provide protection up to ten years, and the pertussis component will give you protection against whooping cough for life

## 2016-09-10 ENCOUNTER — Encounter: Payer: Self-pay | Admitting: Family Medicine

## 2016-09-10 ENCOUNTER — Encounter: Payer: Self-pay | Admitting: *Deleted

## 2016-09-10 DIAGNOSIS — N6489 Other specified disorders of breast: Secondary | ICD-10-CM | POA: Insufficient documentation

## 2016-09-10 NOTE — Telephone Encounter (Signed)
Referral to surg

## 2016-09-18 ENCOUNTER — Ambulatory Visit: Payer: Self-pay | Admitting: General Surgery

## 2016-10-03 ENCOUNTER — Encounter: Payer: Self-pay | Admitting: Family Medicine

## 2016-10-03 DIAGNOSIS — N6489 Other specified disorders of breast: Secondary | ICD-10-CM

## 2016-10-04 NOTE — Assessment & Plan Note (Signed)
Refer, recurred; may be eczematous, but will have surgeon evaluate

## 2016-10-10 ENCOUNTER — Ambulatory Visit (INDEPENDENT_AMBULATORY_CARE_PROVIDER_SITE_OTHER): Payer: 59 | Admitting: General Surgery

## 2016-10-10 ENCOUNTER — Encounter: Payer: Self-pay | Admitting: General Surgery

## 2016-10-10 VITALS — BP 110/68 | HR 107 | Resp 14 | Ht 60.0 in | Wt 138.0 lb

## 2016-10-10 DIAGNOSIS — N6489 Other specified disorders of breast: Secondary | ICD-10-CM | POA: Diagnosis not present

## 2016-10-10 NOTE — Progress Notes (Signed)
Patient ID: Hayley Hunt, female   DOB: 1990/06/23, 26 y.o.   MRN: [id number]  Chief Complaint  Patient presents with  . Breast Problem    HPI Hayley Hunt is a 26 y.o. female.  Here today for breast evaluation, right nipple crusting/cracking; does not bleed. She denies pain, and no lumps. She has been using cortisone cream and lotions to see if that would help but it hasn't. She states she has noticed this about 3 months ago and she started birth control about 4 months ago. No family history of breast cancer.  HPI  Past Medical History:  Diagnosis Date  . Allergic asthma without acute exacerbation or status asthmaticus   . Asthma, mild 09/13/2015  . Common ichthyosis   . Eczema   . History of abnormal cervical Pap smear   . Ovarian cyst     No past surgical history on file.  Family History  Problem Relation Age of Onset  . Asthma Mother   . Hypothyroidism Mother   . Osteoporosis Maternal Grandmother   . Heart attack Maternal Grandfather   . Heart disease Maternal Grandfather   . Cancer Paternal Grandfather        lung  . Breast cancer Neg Hx   . Ovarian cancer Neg Hx     Social History Social History  Substance Use Topics  . Smoking status: Never Smoker  . Smokeless tobacco: Never Used  . Alcohol use No    Allergies  Allergen Reactions  . Penicillins Anaphylaxis and Hives  . Latex     Current Outpatient Prescriptions  Medication Sig Dispense Refill  . albuterol (VENTOLIN HFA) 108 (90 BASE) MCG/ACT inhaler Inhale 1-2 puffs into the lungs every 6 (six) hours as needed.    . ARNUITY ELLIPTA 100 MCG/ACT AEPB Inhale 1 puff into the lungs daily.  0  . BLISOVI 24 FE 1-20 MG-MCG(24) tablet Take 1 tablet by mouth daily.    Marland Kitchen ipratropium-albuterol (DUONEB) 0.5-2.5 (3) MG/3ML SOLN Take 3 mLs by nebulization every 2 (two) hours as needed. 360 mL 0   No current facility-administered medications for this visit.     Review of Systems Review of Systems    Constitutional: Negative.   Respiratory: Negative.   Cardiovascular: Negative.     Blood pressure 110/68, pulse (!) 107, resp. rate 14, height 5' (1.524 m), weight 138 lb (62.6 kg), last menstrual period 09/30/2016.  Physical Exam Physical Exam  Constitutional: She is oriented to person, place, and time. She appears well-developed and well-nourished.  Eyes: Conjunctivae are normal. No scleral icterus.  Neck: Neck supple.  Cardiovascular: Normal rate, regular rhythm and normal heart sounds.   Pulmonary/Chest: Effort normal and breath sounds normal. Right breast exhibits no inverted nipple, no mass, no nipple discharge, no skin change and no tenderness. Left breast exhibits no inverted nipple, no mass, no nipple discharge, no skin change and no tenderness.    Abdominal: Soft. Bowel sounds are normal.  Lymphadenopathy:    She has no cervical adenopathy.    She has no axillary adenopathy.  Neurological: She is alert and oriented to person, place, and time.  Skin: Skin is warm and dry.    Data Reviewed Notes reviewed.  Assessment    Crusting of right nipple- pt advised to use zinc oxide or vaseline once a day for relief. Monitor for now. No intervention or further tests warranted at this time. Stable exam otherwise    Plan    Advised to return if  symptoms get worse, if not return in 2 months.     HPI, Physical Exam, Assessment and Plan have been scribed under the direction and in the presence of Mckinley Jewel, MD  Karie Fetch, RN  I have completed the exam and reviewed the above documentation for accuracy and completeness.  I agree with the above.  Haematologist has been used and any errors in dictation or transcription are unintentional.  Medina Degraffenreid G. Jamal Collin, M.D., F.A.C.S.   Junie Panning G 10/10/2016, 4:26 PM

## 2016-10-10 NOTE — Patient Instructions (Addendum)
Pt advised to use zinc oxide or vaseline once a day for relief. Advised to return if symptoms get worse, if not return in 2 months.

## 2016-12-10 ENCOUNTER — Encounter: Payer: Self-pay | Admitting: General Surgery

## 2016-12-10 ENCOUNTER — Ambulatory Visit (INDEPENDENT_AMBULATORY_CARE_PROVIDER_SITE_OTHER): Payer: 59 | Admitting: General Surgery

## 2016-12-10 VITALS — BP 102/60 | HR 80 | Resp 12 | Ht 60.0 in | Wt 134.0 lb

## 2016-12-10 DIAGNOSIS — N6489 Other specified disorders of breast: Secondary | ICD-10-CM

## 2016-12-10 NOTE — Progress Notes (Signed)
Patient ID: Hayley Hunt, female   DOB: 26-Dec-1990, 26 y.o.   MRN: [id number]  Chief Complaint  Patient presents with  . Follow-up    HPI Hayley Hunt is a 25 y.o. female here today for her two month follow up right nipple crusting. Patient states the area is doing well. The crusting has fully resolved HPI  Past Medical History:  Diagnosis Date  . Allergic asthma without acute exacerbation or status asthmaticus   . Asthma, mild 09/13/2015  . Common ichthyosis   . Eczema   . History of abnormal cervical Pap smear   . Ovarian cyst     No past surgical history on file.  Family History  Problem Relation Age of Onset  . Asthma Mother   . Hypothyroidism Mother   . Osteoporosis Maternal Grandmother   . Heart attack Maternal Grandfather   . Heart disease Maternal Grandfather   . Cancer Paternal Grandfather        lung  . Breast cancer Neg Hx   . Ovarian cancer Neg Hx     Social History Social History  Substance Use Topics  . Smoking status: Never Smoker  . Smokeless tobacco: Never Used  . Alcohol use No    Allergies  Allergen Reactions  . Penicillins Anaphylaxis and Hives  . Latex     Current Outpatient Prescriptions  Medication Sig Dispense Refill  . albuterol (VENTOLIN HFA) 108 (90 BASE) MCG/ACT inhaler Inhale 1-2 puffs into the lungs every 6 (six) hours as needed.    . ARNUITY ELLIPTA 100 MCG/ACT AEPB Inhale 1 puff into the lungs daily.  0  . BLISOVI 24 FE 1-20 MG-MCG(24) tablet Take 1 tablet by mouth daily.    Marland Kitchen ipratropium-albuterol (DUONEB) 0.5-2.5 (3) MG/3ML SOLN Take 3 mLs by nebulization every 2 (two) hours as needed. 360 mL 0   No current facility-administered medications for this visit.     Review of Systems Review of Systems  Constitutional: Negative.   Respiratory: Negative.   Cardiovascular: Negative.     Blood pressure 102/60, pulse 80, resp. rate 12, height 5' (1.524 m), weight 134 lb (60.8 kg), last menstrual period 11/19/2016.  Physical  Exam Physical Exam  Constitutional: She is oriented to person, place, and time. She appears well-developed and well-nourished.  Pulmonary/Chest: Right breast exhibits no inverted nipple, no mass, no nipple discharge, no skin change and no tenderness. Left breast exhibits no inverted nipple, no mass, no nipple discharge, no skin change and no tenderness.  Lymphadenopathy:    She has no axillary adenopathy.  Neurological: She is alert and oriented to person, place, and time.  Skin: Skin is warm and dry.    Data Reviewed Prior notes reviewed   Assessment    Right breast crusting has resolved.     Plan     Patient to return as needed. The patient is aware to call back for any questions or concerns.   HPI, Physical Exam, Assessment and Plan have been scribed under the direction and in the presence of Mckinley Jewel, MD  Gaspar Cola, CMA I have completed the exam and reviewed the above documentation for accuracy and completeness.  I agree with the above.  Haematologist has been used and any errors in dictation or transcription are unintentional.  Seeplaputhur G. Jamal Collin, M.D., F.A.C.S.        Junie Panning G 12/12/2016, 9:55 AM

## 2016-12-10 NOTE — Patient Instructions (Addendum)
Patient to return as needed. Breast checks yearly.  The patient is aware to call back for any questions or concerns.

## 2016-12-19 DIAGNOSIS — R300 Dysuria: Secondary | ICD-10-CM | POA: Diagnosis not present

## 2016-12-19 DIAGNOSIS — J309 Allergic rhinitis, unspecified: Secondary | ICD-10-CM | POA: Diagnosis not present

## 2016-12-19 DIAGNOSIS — N390 Urinary tract infection, site not specified: Secondary | ICD-10-CM | POA: Diagnosis not present

## 2017-01-24 ENCOUNTER — Ambulatory Visit (INDEPENDENT_AMBULATORY_CARE_PROVIDER_SITE_OTHER): Payer: 59 | Admitting: Family Medicine

## 2017-01-24 ENCOUNTER — Encounter: Payer: Self-pay | Admitting: Family Medicine

## 2017-01-24 VITALS — BP 114/62 | HR 92 | Temp 98.4°F | Resp 14 | Wt 134.5 lb

## 2017-01-24 DIAGNOSIS — Z23 Encounter for immunization: Secondary | ICD-10-CM | POA: Diagnosis not present

## 2017-01-24 DIAGNOSIS — B370 Candidal stomatitis: Secondary | ICD-10-CM | POA: Diagnosis not present

## 2017-01-24 MED ORDER — NYSTATIN 100000 UNIT/ML MT SUSP: 5.0000 mL | Freq: Four times a day (QID) | OROMUCOSAL | 0 refills | Status: DC

## 2017-01-24 MED ORDER — LORATADINE 10 MG PO TABS: 10.0000 mg | ORAL_TABLET | Freq: Every day | ORAL | 11 refills | Status: DC | PRN

## 2017-01-24 NOTE — Patient Instructions (Addendum)
Oral Thrush, Adult Oral thrush is an infection in your mouth and throat. It causes white patches on your tongue and in your mouth. Follow these instructions at home: Helping with soreness  To lessen your pain: ? Drink cold liquids, like water and iced tea. ? Eat frozen ice pops or frozen juices. ? Eat foods that are easy to swallow, like gelatin and ice cream. ? Drink from a straw if the patches in your mouth are painful. General instructions   Take or use over-the-counter and prescription medicines only as told by your doctor. Medicine for oral thrush may be something to swallow, or it may be something to put on the infected area.  Eat plain yogurt that has live cultures in it. Read the label to make sure.  If you wear dentures: ? Take out your dentures before you go to bed. ? Brush them well. ? Soak them in a denture cleaner.  Rinse your mouth with warm salt-water many times a day. To make the salt-water mixture, completely dissolve 1/2-1 teaspoon of salt in 1 cup of warm water. Contact a doctor if:  Your problems are getting worse.  Your problems do not get better in less than 7 days with treatment.  Your infection is spreading. This may show as white patches on the skin outside of your mouth.  You are nursing your baby and you have redness and pain in the nipples. This information is not intended to replace advice given to you by your health care provider. Make sure you discuss any questions you have with your health care provider. Document Released: 07/04/2009 Document Revised: 01/02/2016 Document Reviewed: 01/02/2016 Elsevier Interactive Patient Education  2017 Elsevier Inc[employer]  

## 2017-01-24 NOTE — Progress Notes (Signed)
BP 114/62 (BP Location: Left Arm, Patient Position: Sitting, Cuff Size: Normal)   Pulse 92   Temp 98.4 F (36.9 C) (Oral)   Resp 14   Wt 134 lb 8 oz (61 kg)   LMP 01/17/2017   SpO2 98%   BMI 26.27 kg/m    Subjective:    Patient ID: Hayley Hunt, female    DOB: 06/10/1990, 26 y.o.   MRN: [id number]  HPI: Hayley Hunt is a 26 y.o. female  Chief Complaint  Patient presents with  . bumps on tongue     Pt states she feels it in the throat as well    HPI Patient is here for an acute visit She had to do two rounds of antibiotics for UTI; then vaginal yeast infection, took fluconazole (about a week ago) Then two to three days ago, develops bumps on the tongue Under the tongue and down into the throat, sore No pain with swallowing No change in voice No fevers Feels weird or inflamed down the neck, but no discrete lymph nodes A little anxiety, random; nothing I can help her with when I offered; "just life" No abuse She got on line and read about HIV  Depression screen [facility] 2/9 01/24/2017 08/28/2016 04/27/2016 10/24/2015 09/13/2015  Decreased Interest 0 0 0 0 0  Down, Depressed, Hopeless 0 0 0 0 0  PHQ - 2 Score 0 0 0 0 0    Relevant past medical, surgical, family and social history reviewed Past Medical History:  Diagnosis Date  . Allergic asthma without acute exacerbation or status asthmaticus   . Asthma, mild 09/13/2015  . Common ichthyosis   . Eczema   . History of abnormal cervical Pap smear   . Ovarian cyst    History reviewed. No pertinent surgical history. Family History  Problem Relation Age of Onset  . Asthma Mother   . Hypothyroidism Mother   . Osteoporosis Maternal Grandmother   . Heart attack Maternal Grandfather   . Heart disease Maternal Grandfather   . Cancer Paternal Grandfather        lung  . Breast cancer Neg Hx   . Ovarian cancer Neg Hx    Social History   Social History  . Marital status: Single    Spouse name: N/A  . Number of children:  N/A  . Years of education: N/A   Occupational History  . Not on file.   Social History Main Topics  . Smoking status: Never Smoker  . Smokeless tobacco: Never Used  . Alcohol use No  . Drug use: No  . Sexual activity: Yes    Partners: Male    Birth control/ protection: Condom   Other Topics Concern  . Not on file   Social History Narrative  . No narrative on file   Interim medical history since last visit reviewed. Allergies and medications reviewed  Review of Systems Per HPI unless specifically indicated above     Objective:    BP 114/62 (BP Location: Left Arm, Patient Position: Sitting, Cuff Size: Normal)   Pulse 92   Temp 98.4 F (36.9 C) (Oral)   Resp 14   Wt 134 lb 8 oz (61 kg)   LMP 01/17/2017   SpO2 98%   BMI 26.27 kg/m   Wt Readings from Last 3 Encounters:  01/24/17 134 lb 8 oz (61 kg)  12/10/16 134 lb (60.8 kg)  10/10/16 138 lb (62.6 kg)    Physical Exam  Constitutional: She  appears well-developed and well-nourished.  HENT:  Right Ear: Ear canal normal.  Left Ear: Ear canal normal.  Nose: No rhinorrhea.  Mouth/Throat: Mucous membranes are normal. Mucous membranes are not dry. No oral lesions. Normal dentition. No dental abscesses. No oropharyngeal exudate, posterior oropharyngeal edema or posterior oropharyngeal erythema.  Some white patches on the tongue, mild papillae irritation and inflammation; no oral lesions  Eyes: EOM are normal. Right eye exhibits no discharge. Left eye exhibits no discharge. No scleral icterus.  Cardiovascular: Normal rate.   Pulmonary/Chest: Effort normal.  Lymphadenopathy:    She has no cervical adenopathy.  Psychiatric: She has a normal mood and affect. Her behavior is normal.      Assessment & Plan:   Problem List Items Addressed This Visit    None    Visit Diagnoses    Oral thrush    -  Primary   following antibiotics; explained common occurence; will treat with nystatin suspension   Relevant Medications    nystatin (MYCOSTATIN) 100000 UNIT/ML suspension   Needs flu shot       patient declined today and will return       Follow up plan: No Follow-up on file.  An after-visit summary was printed and given to the patient at [place].  Please see the patient instructions which may contain other information and recommendations beyond what is mentioned above in the assessment and plan.  Meds ordered this encounter  Medications  . Azelastine-Fluticasone (DYMISTA) 137-50 MCG/ACT SUSP    Sig: Place 2 sprays into the nose daily.  Marland Kitchen ipratropium-albuterol (DUONEB) 0.5-2.5 (3) MG/3ML SOLN    Sig: Take 3 mLs by nebulization every 6 (six) hours as needed.  . loratadine (CLARITIN) 10 MG tablet    Sig: Take 1 tablet (10 mg total) by mouth daily as needed for allergies.    Dispense:  30 tablet    Refill:  11  . nystatin (MYCOSTATIN) 100000 UNIT/ML suspension    Sig: Take 5 mLs (500,000 Units total) by mouth 4 (four) times daily. 20 seconds, then swallow    Dispense:  150 mL    Refill:  0    No orders of the defined types were placed in this encounter.

## 2017-01-28 ENCOUNTER — Encounter: Payer: Self-pay | Admitting: Family Medicine

## 2017-01-28 MED ORDER — NYSTATIN 100000 UNIT/ML MT SUSP: 5.0000 mL | Freq: Four times a day (QID) | OROMUCOSAL | 0 refills | Status: DC

## 2017-01-30 ENCOUNTER — Ambulatory Visit: Payer: 59 | Admitting: Family Medicine

## 2017-02-01 ENCOUNTER — Ambulatory Visit (INDEPENDENT_AMBULATORY_CARE_PROVIDER_SITE_OTHER): Payer: 59

## 2017-02-01 DIAGNOSIS — Z23 Encounter for immunization: Secondary | ICD-10-CM | POA: Diagnosis not present

## 2017-02-04 ENCOUNTER — Ambulatory Visit (INDEPENDENT_AMBULATORY_CARE_PROVIDER_SITE_OTHER): Payer: 59 | Admitting: Family Medicine

## 2017-02-04 ENCOUNTER — Encounter: Payer: Self-pay | Admitting: Family Medicine

## 2017-02-04 VITALS — BP 110/70 | HR 100 | Temp 98.7°F | Resp 16 | Ht 60.0 in | Wt 133.9 lb

## 2017-02-04 DIAGNOSIS — B370 Candidal stomatitis: Secondary | ICD-10-CM | POA: Diagnosis not present

## 2017-02-04 MED ORDER — CLOTRIMAZOLE 10 MG MT TROC: 10.0000 mg | Freq: Every day | OROMUCOSAL | 0 refills | Status: DC

## 2017-02-04 NOTE — Progress Notes (Signed)
Name: Hayley Hunt   MRN: [id number]    DOB: 12/22/1990   Date:02/04/2017       Progress Note  Subjective  Chief Complaint  Chief Complaint  Patient presents with  . Thrush    for 2 weeks    HPI  Patient presents with concern for ongoing thrush infection and new "bumps" on the sides of her tongue. She was diagnosed and given Nystatin 01/24/2017. Used nystatin for 5 days, then stopped because she developed bumps on the bilateral distal sides of her tongue.  These lesions are not painful, no bleeding or drainage.  She does report ongoing white coating in the posterior tongue.  She endorses anxiety regarding her health as she rarely needs to take medication.  She denies concern for oral STI; she has never used tobacco.   Patient Active Problem List   Diagnosis Date Noted  . Nipple crusting 09/10/2016  . Abnormal appearance of cervix 04/29/2016  . Fatigue 04/27/2016  . Preventative health care 04/27/2016  . Laryngopharyngeal reflux (LPR) 12/25/2015  . Asthma, mild 09/13/2015  . Abdominal bloating 07/29/2015  . Postprandial abdominal bloating 07/29/2015  . Change in stool habits 07/29/2015  . Intestinal gas excretion 07/29/2015  . Abdominal pain, lower 07/29/2015  . Annual physical exam 04/04/2015  . Encounter for immunization 04/04/2015  . Encounter for screening for malignant neoplasm of cervix 04/04/2015  . Allergic rhinitis 11/22/2014  . Dermatitis, eczematoid 11/22/2014  . H/O abnormal cervical Papanicolaou smear 11/22/2014  . Wrist pain, right 11/22/2014    Social History  Substance Use Topics  . Smoking status: Never Smoker  . Smokeless tobacco: Never Used  . Alcohol use No     Current Outpatient Prescriptions:  .  albuterol (VENTOLIN HFA) 108 (90 BASE) MCG/ACT inhaler, Inhale 1-2 puffs into the lungs every 6 (six) hours as needed., Disp: , Rfl:  .  ARNUITY ELLIPTA 100 MCG/ACT AEPB, Inhale 1 puff into the lungs daily., Disp: , Rfl: 0 .  Azelastine-Fluticasone  (DYMISTA) 137-50 MCG/ACT SUSP, Place 2 sprays into the nose daily., Disp: , Rfl:  .  BLISOVI 24 FE 1-20 MG-MCG(24) tablet, Take 1 tablet by mouth daily., Disp: , Rfl:  .  ipratropium-albuterol (DUONEB) 0.5-2.5 (3) MG/3ML SOLN, Take 3 mLs by nebulization every 6 (six) hours as needed., Disp: , Rfl:  .  loratadine (CLARITIN) 10 MG tablet, Take 1 tablet (10 mg total) by mouth daily as needed for allergies., Disp: 30 tablet, Rfl: 11 .  nystatin (MYCOSTATIN) 100000 UNIT/ML suspension, Take 5 mLs (500,000 Units total) by mouth 4 (four) times daily. 20 seconds, then swallow (Patient not taking: Reported on 02/04/2017), Disp: 150 mL, Rfl: 0  Allergies  Allergen Reactions  . Penicillins Anaphylaxis and Hives  . Latex     ROS  Constitutional: Negative for fever or weight change.  HEENT: Denies cough, sinus congestion, taste alteration. Respiratory: Negative for cough and shortness of breath.   Cardiovascular: Negative for chest pain or palpitations.  Gastrointestinal: Negative for abdominal pain, no bowel changes.  Musculoskeletal: Negative for gait problem or joint swelling.  Skin: Negative for rash.  Neurological: Negative for dizziness or headache.  No other specific complaints in a complete review of systems (except as listed in HPI above).  Objective  Vitals:   02/04/17 1148  BP: 110/70  Pulse: 100  Resp: 16  Temp: 98.7 F (37.1 C)  TempSrc: Oral  SpO2: 96%  Weight: 133 lb 14.4 oz (60.7 kg)  Height: 5' (1.524 m)  Body mass index is 26.15 kg/m.  Nursing Note and Vital Signs reviewed.  Physical Exam  Constitutional: Patient appears well-developed and well-nourished.  No distress.  HEENT: head atraumatic, normocephalic, pupils equal and reactive to light, EOM's intact, neck supple with very mild lymphadenopathy, Oropharynx is pink and moist.  Tongue has very small, non-ulcerated papillae inflammation on the distal lateral areas on both sides of the tongue; small white patches  still visible in the posterior tongue; Cardiovascular: Normal rate, regular rhythm, S1/S2 present.  No murmur or rub heard. No BLE edema. Pulmonary/Chest: Effort normal and breath sounds clear. No respiratory distress or retractions. Psychiatric: Patient has a normal mood and affect. behavior is normal. Judgment and thought content normal.  No results found for this or any previous visit (from the past 2160 hour(s)).   Assessment & Plan  1. Oral thrush - clotrimazole (MYCELEX) 10 MG troche; Take 1 tablet (10 mg total) by mouth 5 (five) times daily. Allow to dissolve in mouth 5 times daily  Dispense: 70 tablet; Refill: 0 - Advised that we will try different medication as she was imcompletely treated with Nystatin to avoid resistance. Pt does report she occasionally has nausea related to taste of medication, and she will try taking this, but will call back if she needs nausea medication. - Advised follow up in 2 weeks with Dr. Sanda Klein PCP to ensure resolution of symptoms.

## 2017-02-18 ENCOUNTER — Ambulatory Visit: Payer: 59 | Admitting: Family Medicine

## 2017-02-19 ENCOUNTER — Ambulatory Visit (INDEPENDENT_AMBULATORY_CARE_PROVIDER_SITE_OTHER): Payer: 59 | Admitting: Family Medicine

## 2017-02-19 ENCOUNTER — Encounter: Payer: Self-pay | Admitting: Family Medicine

## 2017-02-19 VITALS — BP 108/70 | HR 89 | Temp 98.4°F | Resp 16 | Ht 60.0 in | Wt 135.1 lb

## 2017-02-19 DIAGNOSIS — R590 Localized enlarged lymph nodes: Secondary | ICD-10-CM | POA: Diagnosis not present

## 2017-02-19 DIAGNOSIS — B370 Candidal stomatitis: Secondary | ICD-10-CM | POA: Diagnosis not present

## 2017-02-19 DIAGNOSIS — N915 Oligomenorrhea, unspecified: Secondary | ICD-10-CM

## 2017-02-19 LAB — POCT URINE PREGNANCY: Preg Test, Ur: NEGATIVE

## 2017-02-19 NOTE — Patient Instructions (Signed)
-   Call [facility] in [place] to discuss spotting/lack of a regular period.

## 2017-02-19 NOTE — Progress Notes (Signed)
Name: Hayley Hunt   MRN: [id number]    DOB: 06/05/1990   Date:02/19/2017       Progress Note  Subjective  Chief Complaint  Chief Complaint  Patient presents with  . Follow-up    Thrush in mouth, not any better    HPI  Thrush Follow up: Patient presents to follow up on oral thrush x3 weeks. She notes that her tongue is still pale with white patches on the lateral edges.  She denies pain or bleeding, fevers/chills. She does note that she has had a left submandibular lymph node that has been swollen x2-3 weeks with worsening over the last 2 days.  Late Menses: She has been taking OCP.  She had some spotting on 02/15/2017 but did not have a full period.  She has been taking her OCP daily at the same time daily.  She is currently sexually active, 1 partner in the last 6 months - states partner had a vasectomy and they do not use condoms.  She sees [facility] [place] for [facility]YN care.  Patient Active Problem List   Diagnosis Date Noted  . Nipple crusting 09/10/2016  . Abnormal appearance of cervix 04/29/2016  . Fatigue 04/27/2016  . Preventative health care 04/27/2016  . Laryngopharyngeal reflux (LPR) 12/25/2015  . Asthma, mild 09/13/2015  . Abdominal bloating 07/29/2015  . Postprandial abdominal bloating 07/29/2015  . Change in stool habits 07/29/2015  . Intestinal gas excretion 07/29/2015  . Abdominal pain, lower 07/29/2015  . Annual physical exam 04/04/2015  . Encounter for immunization 04/04/2015  . Encounter for screening for malignant neoplasm of cervix 04/04/2015  . Allergic rhinitis 11/22/2014  . Dermatitis, eczematoid 11/22/2014  . H/O abnormal cervical Papanicolaou smear 11/22/2014  . Wrist pain, right 11/22/2014    Social History  Substance Use Topics  . Smoking status: Never Smoker  . Smokeless tobacco: Never Used  . Alcohol use No     Current Outpatient Prescriptions:  .  albuterol (VENTOLIN HFA) 108 (90 BASE) MCG/ACT inhaler, Inhale 1-2 puffs into the  lungs every 6 (six) hours as needed., Disp: , Rfl:  .  ARNUITY ELLIPTA 100 MCG/ACT AEPB, Inhale 1 puff into the lungs daily., Disp: , Rfl: 0 .  Azelastine-Fluticasone (DYMISTA) 137-50 MCG/ACT SUSP, Place 2 sprays into the nose daily., Disp: , Rfl:  .  BLISOVI 24 FE 1-20 MG-MCG(24) tablet, Take 1 tablet by mouth daily., Disp: , Rfl:  .  ipratropium-albuterol (DUONEB) 0.5-2.5 (3) MG/3ML SOLN, Take 3 mLs by nebulization every 6 (six) hours as needed., Disp: , Rfl:  .  loratadine (CLARITIN) 10 MG tablet, Take 1 tablet (10 mg total) by mouth daily as needed for allergies., Disp: 30 tablet, Rfl: 11 .  clotrimazole (MYCELEX) 10 MG troche, Take 1 tablet (10 mg total) by mouth 5 (five) times daily. Allow to dissolve in mouth 5 times daily (Patient not taking: Reported on 02/19/2017), Disp: 70 tablet, Rfl: 0  Allergies  Allergen Reactions  . Penicillins Anaphylaxis and Hives  . Latex     ROS  Constitutional: Negative for fever or weight change.  HEENT: See HPI Respiratory: Negative for cough and shortness of breath.   Cardiovascular: Negative for chest pain or palpitations.  Gastrointestinal: Negative for abdominal pain, no bowel changes.  Musculoskeletal: Negative for gait problem or joint swelling.  Skin: Negative for rash.  Neurological: Negative for dizziness or headache.  No other specific complaints in a complete review of systems (except as listed in HPI above).  Objective  Vitals:   02/19/17 0809  BP: 108/70  Pulse: 89  Resp: 16  Temp: 98.4 F (36.9 C)  TempSrc: Oral  SpO2: 97%  Weight: 135 lb 1.6 oz (61.3 kg)  Height: 5' (1.524 m)   Body mass index is 26.38 kg/m.  Nursing Note and Vital Signs reviewed.  Physical Exam Constitutional: Patient appears well-developed and well-nourished. No distress.  HEENT: head atraumatic, normocephalic, pupils equal and reactive to light, EOM's intact, TM's without erythema or bulging, no maxillary or frontal sinus pain on palpation, neck  supple with LEFT submandibular lymphadenopathy, oropharynx pink and moist without exudate. Tongue is pale pink with thin white coating that is not easily removed with tongue depressor, small white patches along the lateral aspect bilaterally.  Cardiovascular: Normal rate, regular rhythm, S1/S2 present.  No murmur or rub heard. No BLE edema. Pulmonary/Chest: Effort normal and breath sounds clear. No respiratory distress or retractions. Abdominal: Soft and non-tender, bowel sounds present x4 quadrants. Psychiatric: Patient has a normal mood and affect. behavior is normal. Judgment and thought content normal.  Recent Results (from the past 2160 hour(s))  POCT urine pregnancy     Status: None   Collection Time: 02/19/17  8:46 AM  Result Value Ref Range   Preg Test, Ur Negative Negative   Assessment & Plan  1. Oral thrush - Ambulatory referral to ENT - Advised that we will refer to ENT as Nystatin and Mycelex have both been ineffective.   2. Lymphadenopathy, submandibular - CBC - HIV antibody - RPR - Ambulatory referral to ENT - We will check labs and refer to ENT for further evaluation.  3. Hypomenorrhea - POCT urine pregnancy - Negative. - Call [facility] [place] to discuss symptoms and possibly adjust hormone levels in OCP.  -Red flags and when to present for emergency care or RTC including fever >101.49F, chest pain, shortness of breath, malaise/fatigue, new/worsening/un-resolving symptoms, reviewed with patient at time of visit. Follow up and care instructions discussed and provided in AVS.

## 2017-02-20 LAB — CBC
Hematocrit: 42.4 % (ref 34.0–46.6)
Hemoglobin: 14.2 g/dL (ref 11.1–15.9)
MCH: 30.8 pg (ref 26.6–33.0)
MCHC: 33.5 g/dL (ref 31.5–35.7)
MCV: 92 fL (ref 79–97)
Platelets: 262 10*3/uL (ref 150–379)
RBC: 4.61 x10E6/uL (ref 3.77–5.28)
RDW: 13.3 % (ref 12.3–15.4)
WBC: 6.4 10*3/uL (ref 3.4–10.8)

## 2017-02-20 LAB — HIV ANTIBODY (ROUTINE TESTING W REFLEX): HIV Screen 4th Generation wRfx: NONREACTIVE

## 2017-02-20 LAB — RPR: RPR Ser Ql: NONREACTIVE

## 2017-02-21 DIAGNOSIS — J301 Allergic rhinitis due to pollen: Secondary | ICD-10-CM | POA: Diagnosis not present

## 2017-02-21 DIAGNOSIS — B370 Candidal stomatitis: Secondary | ICD-10-CM | POA: Diagnosis not present

## 2017-02-21 DIAGNOSIS — B379 Candidiasis, unspecified: Secondary | ICD-10-CM | POA: Diagnosis not present

## 2017-02-27 ENCOUNTER — Ambulatory Visit: Payer: 59 | Admitting: Family Medicine

## 2017-02-27 ENCOUNTER — Encounter: Payer: Self-pay | Admitting: Family Medicine

## 2017-02-27 VITALS — BP 112/62 | HR 102 | Temp 98.3°F | Resp 14 | Wt 134.4 lb

## 2017-02-27 DIAGNOSIS — J069 Acute upper respiratory infection, unspecified: Secondary | ICD-10-CM | POA: Diagnosis not present

## 2017-02-27 DIAGNOSIS — R05 Cough: Secondary | ICD-10-CM

## 2017-02-27 DIAGNOSIS — R059 Cough, unspecified: Secondary | ICD-10-CM

## 2017-02-27 MED ORDER — BENZONATATE 100 MG PO CAPS: 100.0000 mg | ORAL_CAPSULE | Freq: Three times a day (TID) | ORAL | 0 refills | Status: DC | PRN

## 2017-02-27 NOTE — Progress Notes (Signed)
Name: Hayley Hunt   MRN: [id number]    DOB: Mar 27, 1991   Date:02/27/2017       Progress Note  Subjective  Chief Complaint  Chief Complaint  Patient presents with  . URI    cough, fatigue, sore throat, left ear pain and headache    HPI  Patient presents with concern for 1.5 weeks of sore throat and 3-4 days of headache, and fatigue.  Endorses dry cough, increased shortness of breath (using nebulizer), LEFT ear pressure, subjective fevers.  No body aches, chest pain, nasal congestion, NVD, or abdominal pain.  Saw ENT regarding her swollen lymph nodes, sore throat, and thrush a few days ago and was told that no additional treatment was needed at that time; he did perform a fungal culture on her oral mucosa and those results are pending.  Pt has asthma and has been taking her inhalers as prescribed.  Patient Active Problem List   Diagnosis Date Noted  . Nipple crusting 09/10/2016  . Abnormal appearance of cervix 04/29/2016  . Fatigue 04/27/2016  . Preventative health care 04/27/2016  . Laryngopharyngeal reflux (LPR) 12/25/2015  . Asthma, mild 09/13/2015  . Abdominal bloating 07/29/2015  . Postprandial abdominal bloating 07/29/2015  . Change in stool habits 07/29/2015  . Intestinal gas excretion 07/29/2015  . Abdominal pain, lower 07/29/2015  . Annual physical exam 04/04/2015  . Encounter for immunization 04/04/2015  . Encounter for screening for malignant neoplasm of cervix 04/04/2015  . Allergic rhinitis 11/22/2014  . Dermatitis, eczematoid 11/22/2014  . H/O abnormal cervical Papanicolaou smear 11/22/2014  . Wrist pain, right 11/22/2014    Social History   Tobacco Use  . Smoking status: Never Smoker  . Smokeless tobacco: Never Used  Substance Use Topics  . Alcohol use: No    Alcohol/week: 0.0 oz     Current Outpatient Medications:  .  albuterol (VENTOLIN HFA) 108 (90 BASE) MCG/ACT inhaler, Inhale 1-2 puffs into the lungs every 6 (six) hours as needed., Disp: , Rfl:   .  ARNUITY ELLIPTA 100 MCG/ACT AEPB, Inhale 1 puff into the lungs daily., Disp: , Rfl: 0 .  Azelastine-Fluticasone (DYMISTA) 137-50 MCG/ACT SUSP, Place 2 sprays into the nose daily., Disp: , Rfl:  .  BLISOVI 24 FE 1-20 MG-MCG(24) tablet, Take 1 tablet by mouth daily., Disp: , Rfl:  .  ipratropium-albuterol (DUONEB) 0.5-2.5 (3) MG/3ML SOLN, Take 3 mLs by nebulization every 6 (six) hours as needed., Disp: , Rfl:  .  loratadine (CLARITIN) 10 MG tablet, Take 1 tablet (10 mg total) by mouth daily as needed for allergies., Disp: 30 tablet, Rfl: 11 .  clotrimazole (MYCELEX) 10 MG troche, Take 1 tablet (10 mg total) by mouth 5 (five) times daily. Allow to dissolve in mouth 5 times daily (Patient not taking: Reported on 02/19/2017), Disp: 70 tablet, Rfl: 0  Allergies  Allergen Reactions  . Penicillins Anaphylaxis and Hives  . Latex     ROS  Ten systems reviewed and is negative except as mentioned in HPI  Objective  Vitals:   02/27/17 1224  BP: 112/62  Pulse: (!) 102  Resp: 14  Temp: 98.3 F (36.8 C)  TempSrc: Oral  SpO2: 96%  Weight: 134 lb 6.4 oz (61 kg)   Body mass index is 26.25 kg/m.  Nursing Note and Vital Signs reviewed.  Physical Exam  Constitutional: Patient appears well-developed and well-nourished. Obese No distress.  HEENT: head atraumatic, normocephalic, pupils equal and reactive to light, EOM's intact, TM's with mild erythema  and no bulging, mild maxillary and frontal sinus tenderness on palpation, neck supple without lymphadenopathy, oropharynx pink and moist without exudate. Cardiovascular: Normal rate, regular rhythm, S1/S2 present.  No murmur or rub heard. No BLE edema. Pulmonary/Chest: Effort normal and breath sounds clear. No respiratory distress or retractions. Psychiatric: Patient has a normal mood and affect. behavior is normal. Judgment and thought content normal.  Recent Results (from the past 2160 hour(s))  CBC     Status: None   Collection Time:  02/19/17 12:00 AM  Result Value Ref Range   WBC 6.4 3.4 - 10.8 x10E3/uL   RBC 4.61 3.77 - 5.28 x10E6/uL   Hemoglobin 14.2 11.1 - 15.9 g/dL   Hematocrit 42.4 34.0 - 46.6 %   MCV 92 79 - 97 fL   MCH 30.8 26.6 - 33.0 pg   MCHC 33.5 31.5 - 35.7 g/dL   RDW 13.3 12.3 - 15.4 %   Platelets 262 150 - 379 x10E3/uL  HIV antibody     Status: None   Collection Time: 02/19/17 12:00 AM  Result Value Ref Range   HIV Screen 4th Generation wRfx Non Reactive Non Reactive  RPR     Status: None   Collection Time: 02/19/17 12:00 AM  Result Value Ref Range   RPR Ser Ql Non Reactive Non Reactive  POCT urine pregnancy     Status: None   Collection Time: 02/19/17  8:46 AM  Result Value Ref Range   Preg Test, Ur Negative Negative     Assessment & Plan  1. Viral upper respiratory tract infection - benzonatate (TESSALON PERLES) 100 MG capsule; Take 1 capsule (100 mg total) 3 (three) times daily as needed by mouth.  Dispense: 20 capsule; Refill: 0 - Advised that physical examination is un-concerning for bacterial infection, and that illness is likely viral.   - Recommend to continue her daily medications including inhaler, claritin, singulair, and dymista; use inhaler or duoneb PRN for wheezing.  2. Cough - benzonatate (TESSALON PERLES) 100 MG capsule; Take 1 capsule (100 mg total) 3 (three) times daily as needed by mouth.  Dispense: 20 capsule; Refill: 0  -Red flags and when to present for emergency care or RTC including fever >101.7F, chest pain, shortness of breath, new/worsening/un-resolving symptoms, reviewed with patient at time of visit. Follow up and care instructions discussed and provided in AVS.

## 2017-02-27 NOTE — Patient Instructions (Addendum)
Cool Mist Vaporizer A cool mist vaporizer is a device that releases a cool mist into the air. If you have a cough or a cold, using a vaporizer may help relieve your symptoms. The mist adds moisture to the air, which may help thin your mucus and make it less sticky. When your mucus is thin and less sticky, it easier for you to breathe and to cough up secretions. Do not use a vaporizer if you are allergic to mold. Follow these instructions at home:  Follow the instructions that come with the vaporizer.  Do not use anything other than distilled water in the vaporizer.  Do not run the vaporizer all of the time. Doing that can cause mold or bacteria to grow in the vaporizer.  Clean the vaporizer after each time that you use it.  Clean and dry the vaporizer well before storing it.  Stop using the vaporizer if your breathing symptoms get worse. This information is not intended to replace advice given to you by your health care provider. Make sure you discuss any questions you have with your health care provider. Document Released: 01/05/2004 Document Revised: 10/28/2015 Document Reviewed: 07/09/2015 Elsevier Interactive Patient Education  2018 [employer]   Upper Respiratory Infection, Adult Most upper respiratory infections (URIs) are caused by a virus. A URI affects the nose, throat, and upper air passages. The most common type of URI is often called "the common cold." Follow these instructions at home:  Take medicines only as told by your doctor.  Gargle warm saltwater or take cough drops to comfort your throat as told by your doctor.  Use a warm mist humidifier or inhale steam from a shower to increase air moisture. This may make it easier to breathe.  Drink enough fluid to keep your pee (urine) clear or pale yellow.  Eat soups and other clear broths.  Have a healthy diet.  Rest as needed.  Go back to work when your fever is gone or your doctor says it is okay. ? You may need  to stay home longer to avoid giving your URI to others. ? You can also wear a face mask and wash your hands often to prevent spread of the virus.  Use your inhaler more if you have asthma.  Do not use any tobacco products, including cigarettes, chewing tobacco, or electronic cigarettes. If you need help quitting, ask your doctor. Contact a doctor if:  You are getting worse, not better.  Your symptoms are not helped by medicine.  You have chills.  You are getting more short of breath.  You have brown or red mucus.  You have yellow or brown discharge from your nose.  You have pain in your face, especially when you bend forward.  You have a fever.  You have puffy (swollen) neck glands.  You have pain while swallowing.  You have white areas in the back of your throat. Get help right away if:  You have very bad or constant: ? Headache. ? Ear pain. ? Pain in your forehead, behind your eyes, and over your cheekbones (sinus pain). ? Chest pain.  You have long-lasting (chronic) lung disease and any of the following: ? Wheezing. ? Long-lasting cough. ? Coughing up blood. ? A change in your usual mucus.  You have a stiff neck.  You have changes in your: ? Vision. ? Hearing. ? Thinking. ? Mood. This information is not intended to replace advice given to you by your health care provider. Make   sure you discuss any questions you have with your health care provider. Document Released: 09/26/2007 Document Revised: 12/11/2015 Document Reviewed: 07/15/2013 Elsevier Interactive Patient Education  2018 [employer]   

## 2017-03-04 ENCOUNTER — Encounter: Payer: Self-pay | Admitting: Family Medicine

## 2017-03-29 DIAGNOSIS — N390 Urinary tract infection, site not specified: Secondary | ICD-10-CM | POA: Diagnosis not present

## 2017-03-29 DIAGNOSIS — Z1283 Encounter for screening for malignant neoplasm of skin: Secondary | ICD-10-CM | POA: Diagnosis not present

## 2017-04-30 ENCOUNTER — Ambulatory Visit (INDEPENDENT_AMBULATORY_CARE_PROVIDER_SITE_OTHER): Payer: 59 | Admitting: Family Medicine

## 2017-04-30 ENCOUNTER — Encounter: Payer: Self-pay | Admitting: Family Medicine

## 2017-04-30 VITALS — BP 108/62 | HR 90 | Temp 98.4°F | Ht 61.0 in | Wt 131.6 lb

## 2017-04-30 DIAGNOSIS — Z113 Encounter for screening for infections with a predominantly sexual mode of transmission: Secondary | ICD-10-CM

## 2017-04-30 DIAGNOSIS — Z0000 Encounter for general adult medical examination without abnormal findings: Secondary | ICD-10-CM

## 2017-04-30 DIAGNOSIS — Z8742 Personal history of other diseases of the female genital tract: Secondary | ICD-10-CM

## 2017-04-30 DIAGNOSIS — R61 Generalized hyperhidrosis: Secondary | ICD-10-CM

## 2017-04-30 DIAGNOSIS — R591 Generalized enlarged lymph nodes: Secondary | ICD-10-CM | POA: Diagnosis not present

## 2017-04-30 DIAGNOSIS — R740 Nonspecific elevation of levels of transaminase and lactic acid dehydrogenase [LDH]: Secondary | ICD-10-CM | POA: Diagnosis not present

## 2017-04-30 DIAGNOSIS — R300 Dysuria: Secondary | ICD-10-CM

## 2017-04-30 DIAGNOSIS — Z87898 Personal history of other specified conditions: Secondary | ICD-10-CM | POA: Diagnosis not present

## 2017-04-30 DIAGNOSIS — N898 Other specified noninflammatory disorders of vagina: Secondary | ICD-10-CM

## 2017-04-30 NOTE — Patient Instructions (Addendum)
Health Maintenance, Female Adopting a healthy lifestyle and getting preventive care can go a long way to promote health and wellness. Talk with your health care provider about what schedule of regular examinations is right for you. This is a good chance for you to check in with your provider about disease prevention and staying healthy. In between checkups, there are plenty of things you can do on your own. Experts have done a lot of research about which lifestyle changes and preventive measures are most likely to keep you healthy. Ask your health care provider for more information. Weight and diet Eat a healthy diet  Be sure to include plenty of vegetables, fruits, low-fat dairy products, and lean protein.  Do not eat a lot of foods high in solid fats, added sugars, or salt.  Get regular exercise. This is one of the most important things you can do for your health. ? Most adults should exercise for at least 150 minutes each week. The exercise should increase your heart rate and make you sweat (moderate-intensity exercise). ? Most adults should also do strengthening exercises at least twice a week. This is in addition to the moderate-intensity exercise.  Maintain a healthy weight  Body mass index (BMI) is a measurement that can be used to identify possible weight problems. It estimates body fat based on height and weight. Your health care provider can help determine your BMI and help you achieve or maintain a healthy weight.  For females 20 years of age and older: ? A BMI below 18.5 is considered underweight. ? A BMI of 18.5 to 24.9 is normal. ? A BMI of 25 to 29.9 is considered overweight. ? A BMI of 30 and above is considered obese.  Watch levels of cholesterol and blood lipids  You should start having your blood tested for lipids and cholesterol at 28 years of age, then have this test every 5 years.  You may need to have your cholesterol levels checked more often if: ? Your lipid or  cholesterol levels are high. ? You are older than 28 years of age. ? You are at high risk for heart disease.  Cancer screening Lung Cancer  Lung cancer screening is recommended for adults 55-80 years old who are at high risk for lung cancer because of a history of smoking.  A yearly low-dose CT scan of the lungs is recommended for people who: ? Currently smoke. ? Have quit within the past 15 years. ? Have at least a 30-pack-year history of smoking. A pack year is smoking an average of one pack of cigarettes a day for 1 year.  Yearly screening should continue until it has been 15 years since you quit.  Yearly screening should stop if you develop a health problem that would prevent you from having lung cancer treatment.  Breast Cancer  Practice breast self-awareness. This means understanding how your breasts normally appear and feel.  It also means doing regular breast self-exams. Let your health care provider know about any changes, no matter how small.  If you are in your 20s or 30s, you should have a clinical breast exam (CBE) by a health care provider every 1-3 years as part of a regular health exam.  If you are 40 or older, have a CBE every year. Also consider having a breast X-ray (mammogram) every year.  If you have a family history of breast cancer, talk to your health care provider about genetic screening.  If you are at high risk   for breast cancer, talk to your health care provider about having an MRI and a mammogram every year.  Breast cancer gene (BRCA) assessment is recommended for women who have family members with BRCA-related cancers. BRCA-related cancers include: ? Breast. ? Ovarian. ? Tubal. ? Peritoneal cancers.  Results of the assessment will determine the need for genetic counseling and BRCA1 and BRCA2 testing.  Cervical Cancer Your health care provider may recommend that you be screened regularly for cancer of the pelvic organs (ovaries, uterus, and  vagina). This screening involves a pelvic examination, including checking for microscopic changes to the surface of your cervix (Pap test). You may be encouraged to have this screening done every 3 years, beginning at age 22.  For women ages 56-65, health care providers may recommend pelvic exams and Pap testing every 3 years, or they may recommend the Pap and pelvic exam, combined with testing for human papilloma virus (HPV), every 5 years. Some types of HPV increase your risk of cervical cancer. Testing for HPV may also be done on women of any age with unclear Pap test results.  Other health care providers may not recommend any screening for nonpregnant women who are considered low risk for pelvic cancer and who do not have symptoms. Ask your health care provider if a screening pelvic exam is right for you.  If you have had past treatment for cervical cancer or a condition that could lead to cancer, you need Pap tests and screening for cancer for at least 20 years after your treatment. If Pap tests have been discontinued, your risk factors (such as having a new sexual partner) need to be reassessed to determine if screening should resume. Some women have medical problems that increase the chance of getting cervical cancer. In these cases, your health care provider may recommend more frequent screening and Pap tests.  Colorectal Cancer  This type of cancer can be detected and often prevented.  Routine colorectal cancer screening usually begins at 28 years of age and continues through 28 years of age.  Your health care provider may recommend screening at an earlier age if you have risk factors for colon cancer.  Your health care provider may also recommend using home test kits to check for hidden blood in the stool.  A small camera at the end of a tube can be used to examine your colon directly (sigmoidoscopy or colonoscopy). This is done to check for the earliest forms of colorectal  cancer.  Routine screening usually begins at age 33.  Direct examination of the colon should be repeated every 5-10 years through 28 years of age. However, you may need to be screened more often if early forms of precancerous polyps or small growths are found.  Skin Cancer  Check your skin from head to toe regularly.  Tell your health care provider about any new moles or changes in moles, especially if there is a change in a mole's shape or color.  Also tell your health care provider if you have a mole that is larger than the size of a pencil eraser.  Always use sunscreen. Apply sunscreen liberally and repeatedly throughout the day.  Protect yourself by wearing long sleeves, pants, a wide-brimmed hat, and sunglasses whenever you are outside.  Heart disease, diabetes, and high blood pressure  High blood pressure causes heart disease and increases the risk of stroke. High blood pressure is more likely to develop in: ? People who have blood pressure in the high end of  the normal range (130-139/85-89 mm Hg). ? People who are overweight or obese. ? People who are African American.  If you are 21-29 years of age, have your blood pressure checked every 3-5 years. If you are 3 years of age or older, have your blood pressure checked every year. You should have your blood pressure measured twice-once when you are at a hospital or clinic, and once when you are not at a hospital or clinic. Record the average of the two measurements. To check your blood pressure when you are not at a hospital or clinic, you can use: ? An automated blood pressure machine at a pharmacy. ? A home blood pressure monitor.  If you are between 17 years and 37 years old, ask your health care provider if you should take aspirin to prevent strokes.  Have regular diabetes screenings. This involves taking a blood sample to check your fasting blood sugar level. ? If you are at a normal weight and have a low risk for diabetes,  have this test once every three years after 28 years of age. ? If you are overweight and have a high risk for diabetes, consider being tested at a younger age or more often. Preventing infection Hepatitis B  If you have a higher risk for hepatitis B, you should be screened for this virus. You are considered at high risk for hepatitis B if: ? You were born in a country where hepatitis B is common. Ask your health care provider which countries are considered high risk. ? Your parents were born in a high-risk country, and you have not been immunized against hepatitis B (hepatitis B vaccine). ? You have HIV or AIDS. ? You use needles to inject street drugs. ? You live with someone who has hepatitis B. ? You have had sex with someone who has hepatitis B. ? You get hemodialysis treatment. ? You take certain medicines for conditions, including cancer, organ transplantation, and autoimmune conditions.  Hepatitis C  Blood testing is recommended for: ? Everyone born from [birth year] through 1966. ? Anyone with known risk factors for hepatitis C.  Sexually transmitted infections (STIs)  You should be screened for sexually transmitted infections (STIs) including gonorrhea and chlamydia if: ? You are sexually active and are younger than 28 years of age. ? You are older than 28 years of age and your health care provider tells you that you are at risk for this type of infection. ? Your sexual activity has changed since you were last screened and you are at an increased risk for chlamydia or gonorrhea. Ask your health care provider if you are at risk.  If you do not have HIV, but are at risk, it may be recommended that you take a prescription medicine daily to prevent HIV infection. This is called pre-exposure prophylaxis (PrEP). You are considered at risk if: ? You are sexually active and do not regularly use condoms or know the HIV status of your partner(s). ? You take drugs by injection. ? You are  sexually active with a partner who has HIV.  Talk with your health care provider about whether you are at high risk of being infected with HIV. If you choose to begin PrEP, you should first be tested for HIV. You should then be tested every 3 months for as long as you are taking PrEP. Pregnancy  If you are premenopausal and you may become pregnant, ask your health care provider about preconception counseling.  If you may become  pregnant, take 400 to 800 micrograms (mcg) of folic acid every day.  If you want to prevent pregnancy, talk to your health care provider about birth control (contraception). Osteoporosis and menopause  Osteoporosis is a disease in which the bones lose minerals and strength with aging. This can result in serious bone fractures. Your risk for osteoporosis can be identified using a bone density scan.  If you are 65 years of age or older, or if you are at risk for osteoporosis and fractures, ask your health care provider if you should be screened.  Ask your health care provider whether you should take a calcium or vitamin D supplement to lower your risk for osteoporosis.  Menopause may have certain physical symptoms and risks.  Hormone replacement therapy may reduce some of these symptoms and risks. Talk to your health care provider about whether hormone replacement therapy is right for you. Follow these instructions at home:  Schedule regular health, dental, and eye exams.  Stay current with your immunizations.  Do not use any tobacco products including cigarettes, chewing tobacco, or electronic cigarettes.  If you are pregnant, do not drink alcohol.  If you are breastfeeding, limit how much and how often you drink alcohol.  Limit alcohol intake to no more than 1 drink per day for nonpregnant women. One drink equals 12 ounces of beer, 5 ounces of wine, or 1 ounces of hard liquor.  Do not use street drugs.  Do not share needles.  Ask your health care  provider for help if you need support or information about quitting drugs.  Tell your health care provider if you often feel depressed.  Tell your health care provider if you have ever been abused or do not feel safe at home. This information is not intended to replace advice given to you by your health care provider. Make sure you discuss any questions you have with your health care provider. Document Released: 10/23/2010 Document Revised: 09/15/2015 Document Reviewed: 01/11/2015 Elsevier Interactive Patient Education  2018 [employer]    Safe Sex Practicing safe sex means taking steps before and during sex to reduce your risk of:  Getting an STD (sexually transmitted disease).  Giving your partner an STD.  Unwanted pregnancy.  How can I practice safe sex?  To practice safe sex:  Limit your sexual partners to only one partner who is having sex with only you.  Avoid using alcohol and recreational drugs before having sex. These substances can affect your judgment.  Before having sex with a new partner: ? Talk to your partner about past partners, past STDs, and drug use. ? You and your partner should be screened for STDs and discuss the results with each other.  Check your body regularly for sores, blisters, rashes, or unusual discharge. If you notice any of these problems, visit your health care provider.  If you have symptoms of an infection or you are being treated for an STD, avoid sexual contact.  While having sex, use a condom. Make sure to: ? Use a condom every time you have vaginal, oral, or anal sex. Both females and males should wear condoms during oral sex. ? Keep condoms in place from the beginning to the end of sexual activity. ? Use a latex condom, if possible. Latex condoms offer the best protection. ? Use only water-based lubricants or oils to lubricate a condom. Using petroleum-based lubricants or oils will weaken the condom and increase the chance that it  will break.  See your   health care provider for regular screenings, exams, and tests for STDs.  Talk with your health care provider about the form of birth control (contraception) that is best for you.  Get vaccinated against hepatitis B and human papillomavirus (HPV).  If you are at risk of being infected with HIV (human immunodeficiency virus), talk with your health care provider about taking a prescription medicine to prevent HIV infection. You are considered at risk for HIV if: ? You are a man who has sex with other men. ? You are a heterosexual man or woman who is sexually active with more than one partner. ? You take drugs by injection. ? You are sexually active with a partner who has HIV.  This information is not intended to replace advice given to you by your health care provider. Make sure you discuss any questions you have with your health care provider. Document Released: 05/17/2004 Document Revised: 08/24/2015 Document Reviewed: 02/27/2015 Elsevier Interactive Patient Education  2018 [employer] Inc.  

## 2017-04-30 NOTE — Assessment & Plan Note (Signed)
USPSTF grade A and B recommendations reviewed with patient; age-appropriate recommendations, preventive care, screening tests, etc discussed and encouraged; healthy living encouraged; see AVS for patient education given to patient  

## 2017-04-30 NOTE — Assessment & Plan Note (Signed)
Repeat pap today

## 2017-04-30 NOTE — Progress Notes (Signed)
Patient ID: Hayley Hunt, female   DOB: 04-06-1991, 27 y.o.   MRN: [id number]   Subjective:   Hayley Hunt is a 27 y.o. female here for a complete physical exam  Interim issues since last visit: she is having throbbing in the neck, just the left side; going on for  In October, she had a swollen lymph node and was sick; went to the ENT; they didn't really see anything; probably had a virus; she doesn't think the lymph node is enlarged; did not have strep; no fevers then or now; no trouble swallowing; weather changes; asthma gets a little worse at this time of year, especially at night; not using inhaler, not to that extent; lays down and can hear herself; ears were bothering her in October, but not now; normal typical sinus drainage; having some night sweats  USPSTF grade A and B recommendations Depression:  Depression screen [facility] 2/9 04/30/2017 02/27/2017 01/24/2017 08/28/2016 04/27/2016  Decreased Interest 0 0 0 0 0  Down, Depressed, Hopeless 0 0 0 0 0  PHQ - 2 Score 0 0 0 0 0   Hypertension: BP Readings from Last 3 Encounters:  04/30/17 108/62  02/27/17 112/62  02/19/17 108/70   Obesity: Wt Readings from Last 3 Encounters:  04/30/17 131 lb 9.6 oz (59.7 kg)  02/27/17 134 lb 6.4 oz (61 kg)  02/19/17 135 lb 1.6 oz (61.3 kg)   BMI Readings from Last 3 Encounters:  04/30/17 24.87 kg/m  02/27/17 26.25 kg/m  02/19/17 26.38 kg/m    Skin cancer: no worrisome moles, just saw derm Lung cancer:  n/a Breast cancer: no lumps Colorectal cancer: no one in the family  BRCA gene screening: family hx of breast and/or ovarian cancer and/or metastatic prostate cancer? no Cervical cancer screening: GYN says yearly is needed; however, 2018 and 2016 normal HIV, hep B, hep C: just HIV test STD testing and prevention (chl/gon/syphilis): not interested Intimate partner violence: no abuse Contraception: birth control Osteoporosis: n/a Fall prevention/vitamin D: discussed, vit D was a little low  last year; taking one vit D 5000 weekly  Diet: good eater Exercise: regular Alcohol: no Tobacco use: no Aspirin: n/a Lipids: check; fasting Lab Results  Component Value Date   CHOL 199 04/27/2016   Lab Results  Component Value Date   HDL 50 04/27/2016   Lab Results  Component Value Date   LDLCALC 133 (H) 04/27/2016   Lab Results  Component Value Date   TRIG 79 04/27/2016   Lab Results  Component Value Date   CHOLHDL 4.0 04/27/2016   No results found for: LDLDIRECT Glucose:  Glucose  Date Value Ref Range Status  04/27/2016 82 65 - 99 mg/dL Final  07/29/2015 96 65 - 99 mg/dL Final  08/27/2011 91 65 - 99 mg/dL Final     Past Medical History:  Diagnosis Date  . Allergic asthma without acute exacerbation or status asthmaticus   . Asthma, mild 09/13/2015  . Common ichthyosis   . Eczema   . History of abnormal cervical Pap smear   . Ovarian cyst    History reviewed. No pertinent surgical history. Family History  Problem Relation Age of Onset  . Asthma Mother   . Hypothyroidism Mother   . Osteoporosis Maternal Grandmother   . Heart attack Maternal Grandfather   . Heart disease Maternal Grandfather   . Cancer Paternal Grandfather        lung  . Breast cancer Neg Hx   . Ovarian cancer Neg  Hx    Social History   Tobacco Use  . Smoking status: Never Smoker  . Smokeless tobacco: Never Used  Substance Use Topics  . Alcohol use: No    Alcohol/week: 0.0 oz  . Drug use: No   Review of Systems  Constitutional: Negative for fever and unexpected weight change.  HENT: Positive for mouth sores (had thrush back in October, but gone now) and rhinorrhea (typical). Negative for ear pain, facial swelling, sinus pressure, sinus pain, sore throat and trouble swallowing.        Had an infection in her gum on the left side in September  Respiratory: Positive for wheezing (occasional with asthma). Negative for cough.   Cardiovascular: Negative for leg swelling.   Gastrointestinal: Negative for blood in stool.  Endocrine: Negative for polydipsia.  Genitourinary: Positive for dysuria. Negative for hematuria.       Sexually active, partner x 5 months; not using condoms  Allergic/Immunologic: Positive for food allergies (ENT gave her order to get labs done to check; doesn't tolerate dairy).  Neurological: Negative for tremors.  Hematological: Bruises/bleeds easily (nothing new).  Psychiatric/Behavioral: Negative for dysphoric mood.    Objective:   Vitals:   04/30/17 0823 04/30/17 0824  BP: 108/62   Pulse: (!) 105 90  Temp: 98.4 F (36.9 C)   TempSrc: Oral   SpO2: 99%   Weight: 131 lb 9.6 oz (59.7 kg)   Height: '5\' 1"'  (1.549 m)    Body mass index is 24.87 kg/m. Wt Readings from Last 3 Encounters:  04/30/17 131 lb 9.6 oz (59.7 kg)  02/27/17 134 lb 6.4 oz (61 kg)  02/19/17 135 lb 1.6 oz (61.3 kg)   Physical Exam  Constitutional: She appears well-developed and well-nourished.  HENT:  Head: Normocephalic and atraumatic.  Right Ear: Tympanic membrane and ear canal normal.  Left Ear: Tympanic membrane and ear canal normal.  Nose: No rhinorrhea.  Mouth/Throat: Oropharynx is clear and moist and mucous membranes are normal. No oral lesions. Normal dentition. No dental abscesses, lacerations or dental caries.  Eyes: Conjunctivae and EOM are normal. Right eye exhibits no hordeolum. Left eye exhibits no hordeolum. No scleral icterus.  Neck: Carotid bruit is not present. No thyromegaly present.  Cardiovascular: Normal rate, regular rhythm, S1 normal, S2 normal and normal heart sounds.  No extrasystoles are present.  Pulmonary/Chest: Effort normal and breath sounds normal. No respiratory distress. Right breast exhibits no inverted nipple, no mass, no nipple discharge, no skin change and no tenderness. Left breast exhibits no inverted nipple, no mass, no nipple discharge, no skin change and no tenderness. Breasts are symmetrical.  Abdominal: Soft.  Normal appearance and bowel sounds are normal. She exhibits no distension, no abdominal bruit, no pulsatile midline mass and no mass. There is no hepatosplenomegaly. There is no tenderness. No hernia.  Genitourinary: Uterus normal. Pelvic exam was performed with patient prone. There is no rash or lesion on the right labia. There is no rash or lesion on the left labia. Cervix exhibits no motion tenderness, no discharge and no friability. Right adnexum displays no mass, no tenderness and no fullness. Left adnexum displays no mass, no tenderness and no fullness. There is tenderness in the vagina. No erythema or bleeding in the vagina. Vaginal discharge (moderate amount of whitish discharge; no clumps) found.  Genitourinary Comments: Cervix positioned slightly to the LEFT  Musculoskeletal: Normal range of motion. She exhibits no edema.  Lymphadenopathy:       Head (right side): No submandibular  adenopathy present.       Head (left side): No submandibular adenopathy present.    She has no cervical adenopathy.    She has no axillary adenopathy.  Neurological: She is alert. She displays no tremor. No cranial nerve deficit. She exhibits normal muscle tone. Gait normal.  Skin: Skin is warm and dry. No bruising and no ecchymosis noted. No cyanosis. No pallor.  Psychiatric: Her speech is normal and behavior is normal. Thought content normal. Her mood appears not anxious. She does not exhibit a depressed mood.    Assessment/Plan:   Problem List Items Addressed This Visit      Other   H/O abnormal cervical Papanicolaou smear    Repeat pap today      Relevant Orders   Pap IG, CT/NG w/ reflex HPV when ASC-U   Annual physical exam - Primary    USPSTF grade A and B recommendations reviewed with patient; age-appropriate recommendations, preventive care, screening tests, etc discussed and encouraged; healthy living encouraged; see AVS for patient education given to patient      Relevant Orders   Pap IG,  CT/NG w/ reflex HPV when ASC-U   CBC with Differential/Platelet   Comprehensive metabolic panel   Lipid panel   TSH    Other Visit Diagnoses    Lymphadenopathy of head and neck       I do not feel any remaining lymphadenopathy; however, will check CBC, LDH; urged patient to contact me if any recurrence of sx   Relevant Orders   Lactate Dehydrogenase (LDH)   Night sweats       check CBC and LDH   Relevant Orders   Lactate Dehydrogenase (LDH)   Vaginal discharge       wet mount collected; urged safe sex, condom use   Relevant Orders   Wet prep, genital   Screen for STD (sexually transmitted disease)       screening tests ordered; safe sex encouraged, see AVS   Relevant Orders   Wet prep, genital   Dysuria       check urine today   Relevant Orders   Urinalysis, Routine w reflex microscopic       No orders of the defined types were placed in this encounter.  Orders Placed This Encounter  Procedures  . Wet prep, genital  . Lactate Dehydrogenase (LDH)  . CBC with Differential/Platelet  . Comprehensive metabolic panel  . Lipid panel  . TSH  . Urinalysis, Routine w reflex microscopic    Follow up plan: Return in about 1 year (around 04/30/2018) for complete physical.  An After Visit Summary was printed and given to the patient.

## 2017-05-01 ENCOUNTER — Encounter: Payer: Self-pay | Admitting: Family Medicine

## 2017-05-01 ENCOUNTER — Telehealth: Payer: Self-pay | Admitting: Family Medicine

## 2017-05-01 DIAGNOSIS — R740 Nonspecific elevation of levels of transaminase and lactic acid dehydrogenase [LDH]: Principal | ICD-10-CM

## 2017-05-01 DIAGNOSIS — R140 Abdominal distension (gaseous): Secondary | ICD-10-CM

## 2017-05-01 DIAGNOSIS — R7401 Elevation of levels of liver transaminase levels: Secondary | ICD-10-CM

## 2017-05-01 DIAGNOSIS — R7402 Elevation of levels of lactic acid dehydrogenase (LDH): Secondary | ICD-10-CM

## 2017-05-01 DIAGNOSIS — R1084 Generalized abdominal pain: Secondary | ICD-10-CM

## 2017-05-01 DIAGNOSIS — R61 Generalized hyperhidrosis: Secondary | ICD-10-CM

## 2017-05-01 LAB — CBC WITH DIFFERENTIAL/PLATELET
Basophils Absolute: 0.1 10*3/uL (ref 0.0–0.2)
Basos: 2 %
EOS (ABSOLUTE): 0.1 10*3/uL (ref 0.0–0.4)
Eos: 1 %
Hematocrit: 39.2 % (ref 34.0–46.6)
Hemoglobin: 12.9 g/dL (ref 11.1–15.9)
Immature Grans (Abs): 0 10*3/uL (ref 0.0–0.1)
Immature Granulocytes: 0 %
Lymphocytes Absolute: 2.6 10*3/uL (ref 0.7–3.1)
Lymphs: 38 %
MCH: 29.4 pg (ref 26.6–33.0)
MCHC: 32.9 g/dL (ref 31.5–35.7)
MCV: 89 fL (ref 79–97)
Monocytes Absolute: 0.6 10*3/uL (ref 0.1–0.9)
Monocytes: 9 %
Neutrophils Absolute: 3.4 10*3/uL (ref 1.4–7.0)
Neutrophils: 50 %
Platelets: 208 10*3/uL (ref 150–379)
RBC: 4.39 x10E6/uL (ref 3.77–5.28)
RDW: 13.6 % (ref 12.3–15.4)
WBC: 6.8 10*3/uL (ref 3.4–10.8)

## 2017-05-01 LAB — LIPID PANEL
Chol/HDL Ratio: 4.6 ratio — ABNORMAL HIGH (ref 0.0–4.4)
Cholesterol, Total: 193 mg/dL (ref 100–199)
HDL: 42 mg/dL (ref >39)
LDL Calculated: 126 mg/dL — ABNORMAL HIGH (ref 0–99)
Triglycerides: 126 mg/dL (ref 0–149)
VLDL Cholesterol Cal: 25 mg/dL (ref 5–40)

## 2017-05-01 LAB — URINALYSIS, ROUTINE W REFLEX MICROSCOPIC
Bilirubin, UA: NEGATIVE
Glucose, UA: NEGATIVE
Ketones, UA: NEGATIVE
Leukocytes, UA: NEGATIVE
Nitrite, UA: NEGATIVE
Protein, UA: NEGATIVE
RBC, UA: NEGATIVE
Specific Gravity, UA: 1.029 (ref 1.005–1.030)
Urobilinogen, Ur: 1 mg/dL (ref 0.2–1.0)
pH, UA: 6.5 (ref 5.0–7.5)

## 2017-05-01 LAB — COMPREHENSIVE METABOLIC PANEL
ALT: 73 IU/L — ABNORMAL HIGH (ref 0–32)
AST: 56 IU/L — ABNORMAL HIGH (ref 0–40)
Albumin/Globulin Ratio: 1.7 (ref 1.2–2.2)
Albumin: 4.2 g/dL (ref 3.5–5.5)
Alkaline Phosphatase: 67 IU/L (ref 39–117)
BUN/Creatinine Ratio: 13 (ref 9–23)
BUN: 10 mg/dL (ref 6–20)
Bilirubin Total: 0.4 mg/dL (ref 0.0–1.2)
CO2: 22 mmol/L (ref 20–29)
Calcium: 8.8 mg/dL (ref 8.7–10.2)
Chloride: 104 mmol/L (ref 96–106)
Creatinine, Ser: 0.76 mg/dL (ref 0.57–1.00)
GFR calc Af Amer: 124 mL/min/{1.73_m2} (ref >59)
GFR calc non Af Amer: 108 mL/min/{1.73_m2} (ref >59)
Globulin, Total: 2.5 g/dL (ref 1.5–4.5)
Glucose: 88 mg/dL (ref 65–99)
Potassium: 4.2 mmol/L (ref 3.5–5.2)
Sodium: 141 mmol/L (ref 134–144)
Total Protein: 6.7 g/dL (ref 6.0–8.5)

## 2017-05-01 LAB — SPECIMEN STATUS REPORT

## 2017-05-01 LAB — TSH: TSH: 2.63 u[IU]/mL (ref 0.450–4.500)

## 2017-05-01 LAB — LACTATE DEHYDROGENASE: LDH: 376 IU/L — ABNORMAL HIGH (ref 119–226)

## 2017-05-01 NOTE — Telephone Encounter (Signed)
Order for liver US entered Order for acute hepatitis panel entered

## 2017-05-01 NOTE — Assessment & Plan Note (Signed)
Order liver US

## 2017-05-02 ENCOUNTER — Other Ambulatory Visit: Payer: Self-pay | Admitting: Family Medicine

## 2017-05-02 ENCOUNTER — Telehealth: Payer: Self-pay

## 2017-05-02 LAB — VAGINITIS/VAGINOSIS, DNA PROBE
Candida Species: NEGATIVE
Gardnerella vaginalis: NEGATIVE
Trichomonas vaginosis: NEGATIVE

## 2017-05-02 LAB — PAP IG, CT-NG, RFX HPV ASCU
Chlamydia, Nuc. Acid Amp: NEGATIVE
Gonococcus by Nucleic Acid Amp: NEGATIVE
PAP Smear Comment: 0

## 2017-05-02 LAB — WET PREP, GENITAL

## 2017-05-02 MED ORDER — FLUCONAZOLE 150 MG PO TABS: 150.0000 mg | ORAL_TABLET | Freq: Once | ORAL | 0 refills | Status: AC

## 2017-05-02 NOTE — Progress Notes (Signed)
Diflucan for yeast infection

## 2017-05-02 NOTE — Telephone Encounter (Signed)
Called pt informed her of the information below. Pt gave verbal understanding.  

## 2017-05-02 NOTE — Telephone Encounter (Signed)
-----   Message from Arnetha Courser, MD sent at 05/01/2017  4:52 PM EST ----- Please have lab ADD ON an acute hepatitis panel to blood in lab [facility]), please order dx: elevated LDH, elevated SGPT Let patient know that her liver enzymes are elevated, so we're going to get an ultrasound of her liver; encourage her to not use any tylenol or drink any alcohol Her LDL cholesterol is a little higher than ideal, so cut down on saturated fats; thank you

## 2017-05-05 LAB — HEPATITIS PANEL, ACUTE
Hep A IgM: NEGATIVE
Hep B C IgM: NEGATIVE
Hep C Virus Ab: <0.1 s/co ratio (ref 0.0–0.9)
Hepatitis B Surface Ag: NEGATIVE

## 2017-05-05 LAB — SPECIMEN STATUS REPORT

## 2017-05-07 ENCOUNTER — Ambulatory Visit
Admission: RE | Admit: 2017-05-07 | Discharge: 2017-05-07 | Disposition: A | Discharge status: Home / Self Care | Payer: 59 | Source: Ambulatory Visit | Attending: Family Medicine | Admitting: Family Medicine

## 2017-05-07 ENCOUNTER — Other Ambulatory Visit: Payer: Self-pay | Admitting: Family Medicine

## 2017-05-07 DIAGNOSIS — K824 Cholesterolosis of gallbladder: Secondary | ICD-10-CM | POA: Diagnosis not present

## 2017-05-07 DIAGNOSIS — R740 Nonspecific elevation of levels of transaminase and lactic acid dehydrogenase [LDH]: Secondary | ICD-10-CM | POA: Diagnosis not present

## 2017-05-07 DIAGNOSIS — K828 Other specified diseases of gallbladder: Secondary | ICD-10-CM

## 2017-05-07 DIAGNOSIS — R7402 Elevation of levels of lactic acid dehydrogenase (LDH): Secondary | ICD-10-CM

## 2017-05-07 DIAGNOSIS — R7401 Elevation of levels of liver transaminase levels: Secondary | ICD-10-CM

## 2017-05-07 NOTE — Assessment & Plan Note (Signed)
Refer to surgeon. 

## 2017-05-07 NOTE — Progress Notes (Signed)
Refer to gen surg; gallbladder sludge

## 2017-05-09 ENCOUNTER — Ambulatory Visit: Payer: 59 | Admitting: General Surgery

## 2017-05-09 ENCOUNTER — Encounter: Payer: Self-pay | Admitting: Family Medicine

## 2017-05-09 ENCOUNTER — Encounter: Payer: Self-pay | Admitting: General Surgery

## 2017-05-09 ENCOUNTER — Ambulatory Visit: Payer: 59 | Admitting: Family Medicine

## 2017-05-09 VITALS — BP 116/66 | HR 110 | Resp 12 | Ht 60.0 in | Wt 135.0 lb

## 2017-05-09 DIAGNOSIS — R740 Nonspecific elevation of levels of transaminase and lactic acid dehydrogenase [LDH]: Secondary | ICD-10-CM | POA: Diagnosis not present

## 2017-05-09 DIAGNOSIS — R7401 Elevation of levels of liver transaminase levels: Secondary | ICD-10-CM

## 2017-05-09 DIAGNOSIS — R140 Abdominal distension (gaseous): Secondary | ICD-10-CM

## 2017-05-09 DIAGNOSIS — K828 Other specified diseases of gallbladder: Secondary | ICD-10-CM | POA: Diagnosis not present

## 2017-05-09 NOTE — Progress Notes (Signed)
Patient ID: Hayley Hunt, female   DOB: 1990-06-18, 27 y.o.   MRN: [id number]  Chief Complaint  Patient presents with  . Other    HPI Hayley Hunt is a 27 y.o. female here today for a evaluation of gallbladder problems. She states she has been having trouble when she eats,she has to move her bowels. It happens about 2 times a week. Feels bloated after meals. This has been going on for about a year. No abd pain or vomiting. Sugar,salads and dairy foods makes her feel sick to her stomach. Ultrasound done on 05/07/2017. Mom, Kem Parkinson is present at visit.  HPI  Past Medical History:  Diagnosis Date  . Allergic asthma without acute exacerbation or status asthmaticus   . Asthma, mild 09/13/2015  . Common ichthyosis   . Eczema   . History of abnormal cervical Pap smear   . Ovarian cyst     History reviewed. No pertinent surgical history.  Family History  Problem Relation Age of Onset  . Asthma Mother   . Hypothyroidism Mother   . Osteoporosis Maternal Grandmother   . Heart attack Maternal Grandfather   . Heart disease Maternal Grandfather   . Cancer Paternal Grandfather        lung  . Breast cancer Neg Hx   . Ovarian cancer Neg Hx     Social History Social History   Tobacco Use  . Smoking status: Never Smoker  . Smokeless tobacco: Never Used  Substance Use Topics  . Alcohol use: No    Alcohol/week: 0.0 oz  . Drug use: No    Allergies  Allergen Reactions  . Penicillins Anaphylaxis and Hives  . Latex     Current Outpatient Medications  Medication Sig Dispense Refill  . albuterol (VENTOLIN HFA) 108 (90 BASE) MCG/ACT inhaler Inhale 1-2 puffs into the lungs every 6 (six) hours as needed.    . ARNUITY ELLIPTA 100 MCG/ACT AEPB Inhale 1 puff into the lungs daily.  0  . Azelastine-Fluticasone (DYMISTA) 137-50 MCG/ACT SUSP Place 2 sprays into the nose daily.    Marland Kitchen BLISOVI 24 FE 1-20 MG-MCG(24) tablet Take 1 tablet by mouth daily.    Marland Kitchen ipratropium-albuterol (DUONEB)  0.5-2.5 (3) MG/3ML SOLN Take 3 mLs by nebulization every 6 (six) hours as needed.    . loratadine (CLARITIN) 10 MG tablet Take 1 tablet (10 mg total) by mouth daily as needed for allergies. 30 tablet 11  . montelukast (SINGULAIR) 10 MG tablet Take 10 mg every evening by mouth.  1   No current facility-administered medications for this visit.     Review of Systems Review of Systems  Constitutional: Negative.   Respiratory: Negative.   Cardiovascular: Negative.   Gastrointestinal: Negative.     Blood pressure 116/66, pulse (!) 110, resp. rate 12, height 5' (1.524 m), weight 135 lb (61.2 kg), last menstrual period 04/12/2017.  Physical Exam Physical Exam  Constitutional: She is oriented to person, place, and time. She appears well-developed and well-nourished.  Eyes: Conjunctivae are normal. No scleral icterus.  Neck: Neck supple.  Cardiovascular: Normal rate, regular rhythm and normal heart sounds.  Pulmonary/Chest: Effort normal and breath sounds normal.  Abdominal: Soft. Normal appearance and bowel sounds are normal. There is no tenderness.  Lymphadenopathy:    She has no cervical adenopathy.  Neurological: She is alert and oriented to person, place, and time.  Skin: Skin is warm and dry.    Data Reviewed Comprehensive metabolic panel is from July 29, 2015 through April 30, 2017 reviewed.  Notable only for mild elevation of the serum transaminases earlier this month. LDH obtained April 30, 2017 elevated at 376. CBC of the same date entirely normal.  Normal differential, platelet count 208,000, hemoglobin 12.9 with an MCV of 89.  Abdominal ultrasound May 07, 2016 was reviewed independently.  No gallbladder wall thickening or pericholecystic fluid.  IMPRESSION: 1. Mild sludge cannot be excluded. No definite gallstones. No biliary distention.  2.  Exam is otherwise unremarkable.  Assessment    No evidence of biliary tract disease to account for the modest elevation  of serum transaminase and LDH.  Abdominal bloating atypical for biliary tract disease.      Plan    In spite of the patient's family history of biliary tract disease (mother) her clinical history does not suggest biliary tract as the source for her abdominal bloating, intermittent loose stools (without mucus or blood) and absence of weight loss.  The patient may benefit from formal gastroenterology evaluation.  Risks of elective surgery for biliary tract disease with no anticipation of resolution of her symptoms is prohibitive.     Patient to return as needed. The patient is aware to call back for any questions or concerns.   HPI, Physical Exam, Assessment and Plan have been scribed under the direction and in the presence of Hervey Ard, MD.  Gaspar Cola, CMA  I have completed the exam and reviewed the above documentation for accuracy and completeness.  I agree with the above.  Haematologist has been used and any errors in dictation or transcription are unintentional.  Hervey Ard, M.D., F.A.C.S.  Forest Gleason Gaston Dase 05/09/2017, 7:51 PM
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
## 2017-05-09 NOTE — Patient Instructions (Addendum)
Let's see what the surgeon says about the sludge and LDH and liver enzymes to see if they might all go together If not, then we'll proceed with a CT scan to get more information We'll follow-up and recheck your labs in 1 week

## 2017-05-09 NOTE — Patient Instructions (Signed)
Patient to return as needed. The patient is aware to call back for any questions or concerns. 

## 2017-05-10 DIAGNOSIS — R740 Nonspecific elevation of levels of transaminase and lactic acid dehydrogenase [LDH]: Secondary | ICD-10-CM | POA: Diagnosis not present

## 2017-05-10 DIAGNOSIS — R1084 Generalized abdominal pain: Secondary | ICD-10-CM | POA: Diagnosis not present

## 2017-05-10 DIAGNOSIS — J305 Allergic rhinitis due to food: Secondary | ICD-10-CM | POA: Diagnosis not present

## 2017-05-10 NOTE — Addendum Note (Signed)
Addended by: Nixon Sparr, Satira Anis on: 05/10/2017 09:47 AM   Modules accepted: Orders

## 2017-05-10 NOTE — Addendum Note (Signed)
Addended by: Kerianna Rawlinson, Satira Anis on: 05/10/2017 08:18 AM   Modules accepted: Orders

## 2017-05-11 ENCOUNTER — Other Ambulatory Visit: Payer: Self-pay | Admitting: Family Medicine

## 2017-05-11 DIAGNOSIS — R7401 Elevation of levels of liver transaminase levels: Secondary | ICD-10-CM

## 2017-05-11 DIAGNOSIS — R7402 Elevation of levels of lactic acid dehydrogenase (LDH): Secondary | ICD-10-CM

## 2017-05-11 DIAGNOSIS — R740 Nonspecific elevation of levels of transaminase and lactic acid dehydrogenase [LDH]: Principal | ICD-10-CM

## 2017-05-11 DIAGNOSIS — R140 Abdominal distension (gaseous): Secondary | ICD-10-CM

## 2017-05-11 LAB — CBC WITH DIFFERENTIAL/PLATELET
Basophils Absolute: 0.1 10*3/uL (ref 0.0–0.2)
Basos: 2 %
EOS (ABSOLUTE): 0.1 10*3/uL (ref 0.0–0.4)
Eos: 1 %
Hematocrit: 36.2 % (ref 34.0–46.6)
Hemoglobin: 12.2 g/dL (ref 11.1–15.9)
Immature Grans (Abs): 0 10*3/uL (ref 0.0–0.1)
Immature Granulocytes: 0 %
Lymphocytes Absolute: 3.6 10*3/uL — ABNORMAL HIGH (ref 0.7–3.1)
Lymphs: 52 %
MCH: 29.8 pg (ref 26.6–33.0)
MCHC: 33.7 g/dL (ref 31.5–35.7)
MCV: 88 fL (ref 79–97)
Monocytes Absolute: 0.6 10*3/uL (ref 0.1–0.9)
Monocytes: 8 %
Neutrophils Absolute: 2.6 10*3/uL (ref 1.4–7.0)
Neutrophils: 37 %
Platelets: 229 10*3/uL (ref 150–379)
RBC: 4.1 x10E6/uL (ref 3.77–5.28)
RDW: 14 % (ref 12.3–15.4)
WBC: 6.9 10*3/uL (ref 3.4–10.8)

## 2017-05-11 LAB — COMPREHENSIVE METABOLIC PANEL
ALT: 113 IU/L — ABNORMAL HIGH (ref 0–32)
AST: 74 IU/L — ABNORMAL HIGH (ref 0–40)
Albumin/Globulin Ratio: 2 (ref 1.2–2.2)
Albumin: 4.2 g/dL (ref 3.5–5.5)
Alkaline Phosphatase: 80 IU/L (ref 39–117)
BUN/Creatinine Ratio: 8 — ABNORMAL LOW (ref 9–23)
BUN: 6 mg/dL (ref 6–20)
Bilirubin Total: 0.5 mg/dL (ref 0.0–1.2)
CO2: 21 mmol/L (ref 20–29)
Calcium: 8.4 mg/dL — ABNORMAL LOW (ref 8.7–10.2)
Chloride: 103 mmol/L (ref 96–106)
Creatinine, Ser: 0.74 mg/dL (ref 0.57–1.00)
GFR calc Af Amer: 128 mL/min/{1.73_m2} (ref >59)
GFR calc non Af Amer: 111 mL/min/{1.73_m2} (ref >59)
Globulin, Total: 2.1 g/dL (ref 1.5–4.5)
Glucose: 118 mg/dL — ABNORMAL HIGH (ref 65–99)
Potassium: 3.7 mmol/L (ref 3.5–5.2)
Sodium: 141 mmol/L (ref 134–144)
Total Protein: 6.3 g/dL (ref 6.0–8.5)

## 2017-05-11 LAB — LIPASE: Lipase: 70 U/L (ref 14–72)

## 2017-05-11 LAB — LACTATE DEHYDROGENASE: LDH: 408 IU/L — ABNORMAL HIGH (ref 119–226)

## 2017-05-11 MED ORDER — LORAZEPAM 0.5 MG PO TABS: 0.2500 mg | ORAL_TABLET | Freq: Two times a day (BID) | ORAL | 0 refills | Status: DC | PRN

## 2017-05-11 NOTE — Assessment & Plan Note (Signed)
Noted with sugars and dairy; she tries to modify diet; to surgeon today, but to GI next

## 2017-05-11 NOTE — Telephone Encounter (Signed)
Rx called in to [facility], pharmacist

## 2017-05-11 NOTE — Assessment & Plan Note (Signed)
Plan to recheck labs; patient not on any agents that would be consider hepatotoxic; not drinking; viral hepatitis labs negative; to GI if surgeon does not think structural issue causing rise

## 2017-05-11 NOTE — Telephone Encounter (Signed)
I spoke with Dr. Bary Castilla yesterday, who did not think the gallbladder sludge and findings on the [place] would explain her symptoms, nor did he think choly would resolve her symptoms He thinks CT reasonable and GI consultation CT study scheduled for Jan 29th I will add GI consultation; referral entered ------------------------------------ I called patient to review her lab results The numbers we have listed for her (the same for home and mobile) present a recording that this number has been disconnected or is no longer in service I tried the number again, left message I called mother, left message that I will send Terree a MyChart message and for her to check, since I can't reach her by phone -------------------------------------- Mychart message sent I tried to call anxiety med in to pharmacy, but pharmacist just stepped out for lunch, they asked me to call back in 30 minutes

## 2017-05-11 NOTE — Assessment & Plan Note (Signed)
Noted on [place]; not sure if this is related to her symptoms, if process causing sludge could affect her liver enzymes; will be seeing surgeon later today; further work-up and referral to GI if not the case

## 2017-05-14 ENCOUNTER — Ambulatory Visit: Payer: 59 | Admitting: Gastroenterology

## 2017-05-14 ENCOUNTER — Other Ambulatory Visit
Admission: RE | Admit: 2017-05-14 | Discharge: 2017-05-14 | Disposition: A | Discharge status: Home / Self Care | Payer: 59 | Source: Ambulatory Visit | Attending: Gastroenterology | Admitting: Gastroenterology

## 2017-05-14 ENCOUNTER — Other Ambulatory Visit: Payer: Self-pay | Admitting: Gastroenterology

## 2017-05-14 ENCOUNTER — Encounter: Payer: Self-pay | Admitting: Gastroenterology

## 2017-05-14 VITALS — BP 107/72 | HR 101 | Ht 60.0 in | Wt 130.8 lb

## 2017-05-14 DIAGNOSIS — R748 Abnormal levels of other serum enzymes: Secondary | ICD-10-CM | POA: Insufficient documentation

## 2017-05-14 LAB — HEPATIC FUNCTION PANEL
ALT: 133 U/L — ABNORMAL HIGH (ref 14–54)
AST: 86 U/L — ABNORMAL HIGH (ref 15–41)
Albumin: 4 g/dL (ref 3.5–5.0)
Alkaline Phosphatase: 74 U/L (ref 38–126)
Bilirubin, Direct: 0.1 mg/dL (ref 0.1–0.5)
Indirect Bilirubin: 0.8 mg/dL (ref 0.3–0.9)
Total Bilirubin: 0.9 mg/dL (ref 0.3–1.2)
Total Protein: 7.2 g/dL (ref 6.5–8.1)

## 2017-05-14 LAB — IRON AND TIBC
Iron: 69 ug/dL (ref 28–170)
Saturation Ratios: 15 % (ref 10.4–31.8)
TIBC: 463 ug/dL — ABNORMAL HIGH (ref 250–450)
UIBC: 394 ug/dL

## 2017-05-14 LAB — FERRITIN: Ferritin: 157 ng/mL (ref 11–307)

## 2017-05-14 NOTE — Progress Notes (Signed)
Gastroenterology Consultation  Referring Provider:     Arnetha Courser, MD Primary Care Physician:  Arnetha Courser, MD Primary Gastroenterologist:  Dr. Allen Norris     Reason for Consultation:     Abnormal liver enzymes        HPI:   Hayley Hunt is a 27 y.o. y/o female referred for consultation & management of Abnormal liver enzymes by Dr. Sanda Klein, Satira Anis, MD.  This patient comes in today with a history of abnormal liver enzymes.  The patient had Liver enzymes that were normal back in January 2018 but increase on January 8 of this year with the AST of 56 and the ALT of 73.  The patient's repeat liver enzymes 10 days later showed the AST at 74 and the ALT at 113.  During this time the patient states that she was taking NSAIDs for muscle pain since she was working out a lot. The patient also had an elevated LDH with normal acute hepatitis panel.  The patient reports that she does drink alcohol but not to excess.  She states that she may have a couple of drinks on the weekend. The patient's elevated liver enzymes precipitated her getting a right upper quadrant ultrasound that showed her to have normal echogenicity of the liver with no definite gallstones or biliary dilatation and it was reported a mild sludge could not be excluded.  The patient has also undergone allergy testing and has been told to be on a specific diet. Patient also reports that she has some abdominal bloating with urgency after eating and needing to defecate.  There is no report of any unexplained weight loss fevers chills nausea or vomiting.  The patient also denies any high risk activities.  Past Medical History:  Diagnosis Date  . Allergic asthma without acute exacerbation or status asthmaticus   . Asthma, mild 09/13/2015  . Common ichthyosis   . Eczema   . History of abnormal cervical Pap smear   . Ovarian cyst     History reviewed. No pertinent surgical history.  Prior to Admission medications   Medication Sig Start  Date End Date Taking? Authorizing Provider  albuterol (VENTOLIN HFA) 108 (90 BASE) MCG/ACT inhaler Inhale 1-2 puffs into the lungs every 6 (six) hours as needed. 05/27/14  Yes [provider]  ARNUITY ELLIPTA 100 MCG/ACT AEPB Inhale 1 puff into the lungs daily. 08/26/16  Yes [provider]  Azelastine-Fluticasone (DYMISTA) 137-50 MCG/ACT SUSP Place 2 sprays into the nose daily.   Yes [provider]  BLISOVI 24 FE 1-20 MG-MCG(24) tablet Take 1 tablet by mouth daily. 08/03/16  Yes [provider]  ipratropium-albuterol (DUONEB) 0.5-2.5 (3) MG/3ML SOLN Take 3 mLs by nebulization every 6 (six) hours as needed. 01/24/17  Yes Lada, Satira Anis, MD  loratadine (CLARITIN) 10 MG tablet Take 1 tablet (10 mg total) by mouth daily as needed for allergies. 01/24/17  Yes Lada, Satira Anis, MD  LORazepam (ATIVAN) 0.5 MG tablet Take 0.5-1 tablets (0.25-0.5 mg total) by mouth 2 (two) times daily as needed for anxiety. 05/11/17  Yes Lada, Satira Anis, MD  montelukast (SINGULAIR) 10 MG tablet Take 10 mg every evening by mouth. 02/21/17  Yes [provider]    Family History  Problem Relation Age of Onset  . Asthma Mother   . Hypothyroidism Mother   . Osteoporosis Maternal Grandmother   . Heart attack Maternal Grandfather   . Heart disease Maternal Grandfather   . Cancer Paternal  Grandfather        lung  . Breast cancer Neg Hx   . Ovarian cancer Neg Hx      Social History   Tobacco Use  . Smoking status: Never Smoker  . Smokeless tobacco: Never Used  Substance Use Topics  . Alcohol use: No    Alcohol/week: 0.0 oz  . Drug use: No    Allergies as of 05/14/2017 - Review Complete 05/14/2017  Allergen Reaction Noted  . Penicillins Anaphylaxis and Hives 11/22/2014  . Latex  11/22/2014    Review of Systems:    All systems reviewed and negative except where noted in HPI.   Physical Exam:  BP 107/72   Pulse (!) 101   Ht 5' (1.524 m)   Wt 130 lb 12.8 oz (59.3 kg)    BMI 25.55 kg/m  No LMP recorded. Psych:  Alert and cooperative. Normal mood and affect. General:   Alert,  Well-developed, well-nourished, pleasant and cooperative in NAD Head:  Normocephalic and atraumatic. Eyes:  Sclera clear, no icterus.   Conjunctiva pink. Ears:  Normal auditory acuity. Nose:  No deformity, discharge, or lesions. Mouth:  No deformity or lesions,oropharynx pink & moist. Neck:  Supple; no masses or thyromegaly. Lungs:  Respirations even and unlabored.  Clear throughout to auscultation.   No wheezes, crackles, or rhonchi. No acute distress. Heart:  Regular rate and rhythm; no murmurs, clicks, rubs, or gallops. Abdomen:  Normal bowel sounds.  No bruits.  Soft, non-tender and non-distended without masses, hepatosplenomegaly or hernias noted.  No guarding or rebound tenderness.  Negative Carnett sign.   Rectal:  Deferred.  Msk:  Symmetrical without gross deformities.  Good, equal movement & strength bilaterally. Pulses:  Normal pulses noted. Extremities:  No clubbing or edema.  No cyanosis. Neurologic:  Alert and oriented x3;  grossly normal neurologically. Skin:  Intact without significant lesions or rashes.  No jaundice. Lymph Nodes:  No significant cervical adenopathy. Psych:  Alert and cooperative. Normal mood and affect.  Imaging Studies: US Abdomen Limited Ruq  Result Date: 05/07/2017 CLINICAL DATA:  Elevated liver function tests. EXAM: ULTRASOUND ABDOMEN LIMITED RIGHT UPPER QUADRANT COMPARISON:  Ultrasound 08/10/2015. FINDINGS: Gallbladder: Mild sludge cannot be excluded. Gallbladder wall thickness normal. Negative Murphy sign. Common bile duct: Diameter: 3.0 mm Liver: No focal lesion identified. Within normal limits in parenchymal echogenicity. Portal vein is patent on color Doppler imaging with normal direction of blood flow towards the liver. IMPRESSION: 1. Mild sludge cannot be excluded. No definite gallstones. No biliary distention. 2.  Exam is otherwise  unremarkable. Electronically Signed   By: Marcello Moores  Register   On: 05/07/2017 08:13    Assessment and Plan:   [name] is a 27 y.o. y/o female with abnormal liver enzymes and LDH.  The patient's bilirubin and alkaline phosphatase were both normal but her AST and ALT have increased over 10 days since she originally had her abnormal liver enzymes found. The patient will have her liver enzymes checked again and will also have a GGT, ferritin, iron studies and blood work sent for other possible causes of abnormal liver enzymes.  The patient has been explained the plan and agrees with it.  The patient has also been told to avoid NSAIDs since they can also cause her to have abnormal liver enzymes.  Lucilla Lame, MD. Marval Regal   Note: This dictation was prepared with Dragon dictation along with smaller phrase technology. Any transcriptional errors that result from this process are unintentional.

## 2017-05-15 ENCOUNTER — Telehealth: Payer: Self-pay | Admitting: Family Medicine

## 2017-05-15 LAB — ANTI-SMOOTH MUSCLE ANTIBODY, IGG: F-Actin IgG: 6 Units (ref 0–19)

## 2017-05-15 LAB — IGG, IGA, IGM
IgA: 78 mg/dL — ABNORMAL LOW (ref 87–352)
IgG (Immunoglobin G), Serum: 791 mg/dL (ref 700–1600)
IgM (Immunoglobulin M), Srm: 73 mg/dL (ref 26–217)

## 2017-05-15 LAB — MITOCHONDRIAL ANTIBODIES: Mitochondrial M2 Ab, IgG: <20 Units (ref 0.0–20.0)

## 2017-05-15 LAB — ALPHA-1 ANTITRYPSIN PHENOTYPE: A-1 Antitrypsin, Ser: 227 mg/dL — ABNORMAL HIGH (ref 90–200)

## 2017-05-15 LAB — ANTINUCLEAR ANTIBODIES, IFA: ANA Ab, IFA: NEGATIVE

## 2017-05-15 LAB — CERULOPLASMIN: Ceruloplasmin: 51.9 mg/dL — ABNORMAL HIGH (ref 19.0–39.0)

## 2017-05-15 NOTE — Telephone Encounter (Signed)
Please contact the patient; let her know that we'll see what Dr. Dorothey Baseman labs show and bypass the CT scan at this time while he works this up If her symptoms change, then do let us know  Roselyn Reef - please cancel the CT scan at this time)

## 2017-05-15 NOTE — Telephone Encounter (Signed)
Pt notified and CT canceled

## 2017-05-20 ENCOUNTER — Telehealth: Payer: Self-pay

## 2017-05-20 ENCOUNTER — Encounter: Payer: Self-pay | Admitting: Gastroenterology

## 2017-05-20 DIAGNOSIS — R748 Abnormal levels of other serum enzymes: Secondary | ICD-10-CM

## 2017-05-20 NOTE — Telephone Encounter (Signed)
Pt notified of results.   Pt stated she saw where some of the labs were not good and would like to know what that means. Please advise.

## 2017-05-20 NOTE — Telephone Encounter (Signed)
-----   Message from Lucilla Lame, MD sent at 05/20/2017 11:13 AM EST ----- Let the patient know that all of her blood work did not point to any particular reason for her liver enzymes to be elevated. We should give it a few more weeks and see if they remain elevated by rechecking them in 2 weeks.

## 2017-05-20 NOTE — Telephone Encounter (Signed)
I already sent you something on this. Everything was normal or better then normal except the liver function tests. I told her that whatever was increasing he liver test can take up to 3 months to go to normal. I last message said to repeat in 2 weeks.

## 2017-05-21 ENCOUNTER — Ambulatory Visit: Payer: 59

## 2017-05-21 NOTE — Telephone Encounter (Signed)
Besides LFT's, what other labs would you like me to reorder for this pt?

## 2017-05-22 NOTE — Telephone Encounter (Signed)
She just needs the LFTs rechecked.

## 2017-05-23 ENCOUNTER — Other Ambulatory Visit: Payer: Self-pay

## 2017-05-23 DIAGNOSIS — R748 Abnormal levels of other serum enzymes: Secondary | ICD-10-CM

## 2017-05-23 NOTE — Telephone Encounter (Signed)
Hepatic function test ordered.

## 2017-06-03 ENCOUNTER — Encounter: Payer: Self-pay | Admitting: Gastroenterology

## 2017-06-04 DIAGNOSIS — R748 Abnormal levels of other serum enzymes: Secondary | ICD-10-CM | POA: Diagnosis not present

## 2017-06-05 LAB — HEPATIC FUNCTION PANEL
ALT: 22 IU/L (ref 0–32)
AST: 18 IU/L (ref 0–40)
Albumin: 4.5 g/dL (ref 3.5–5.5)
Alkaline Phosphatase: 50 IU/L (ref 39–117)
Bilirubin Total: 0.4 mg/dL (ref 0.0–1.2)
Bilirubin, Direct: 0.1 mg/dL (ref 0.00–0.40)
Total Protein: 6.6 g/dL (ref 6.0–8.5)

## 2017-06-05 LAB — IGG, IGA, IGM
IgA/Immunoglobulin A, Serum: 72 mg/dL — ABNORMAL LOW (ref 87–352)
IgG (Immunoglobin G), Serum: 769 mg/dL (ref 700–1600)
IgM (Immunoglobulin M), Srm: 58 mg/dL (ref 26–217)

## 2017-06-05 LAB — CERULOPLASMIN: Ceruloplasmin: 49 mg/dL — ABNORMAL HIGH (ref 19.0–39.0)

## 2017-06-05 LAB — FERRITIN: Ferritin: 51 ng/mL (ref 15–150)

## 2017-06-05 LAB — IRON AND TIBC
Iron Saturation: 17 % (ref 15–55)
Iron: 71 ug/dL (ref 27–159)
Total Iron Binding Capacity: 425 ug/dL (ref 250–450)
UIBC: 354 ug/dL (ref 131–425)

## 2017-06-05 LAB — MITOCHONDRIAL ANTIBODIES: Mitochondrial Ab: <20 Units (ref 0.0–20.0)

## 2017-06-05 LAB — ANA: Anti Nuclear Antibody(ANA): NEGATIVE

## 2017-06-05 LAB — ALPHA-1-ANTITRYPSIN: A-1 Antitrypsin: 208 mg/dL — ABNORMAL HIGH (ref 90–200)

## 2017-06-05 LAB — ANTI-SMOOTH MUSCLE ANTIBODY, IGG: Smooth Muscle Ab: 9 Units (ref 0–19)

## 2017-06-05 LAB — GAMMA GT: GGT: 9 IU/L (ref 0–60)

## 2017-06-06 ENCOUNTER — Telehealth: Payer: Self-pay

## 2017-06-06 NOTE — Telephone Encounter (Signed)
-----   Message from Lucilla Lame, MD sent at 06/06/2017  7:06 AM EST ----- Let the patient know that her liver enzymes have come down to completely normal.  It is likely something she was exposed to as we had discussed in the office.  We should recheck her liver enzymes in 3 months.

## 2017-06-06 NOTE — Telephone Encounter (Signed)
Pt notified of lab results. Pt stated she was still having night sweats but not as bad. She stated her urine was still dark. Spoke with Dr. Allen Norris regarding these symptoms. He stated since her liver enzymes and biliurbin is normal, this is not coming from her liver. She should contact her PCP to discuss other possible reasons for her symptoms.

## 2017-06-10 ENCOUNTER — Encounter: Payer: Self-pay | Admitting: Family Medicine

## 2017-06-24 ENCOUNTER — Telehealth: Payer: Self-pay | Admitting: Family Medicine

## 2017-06-24 DIAGNOSIS — R140 Abdominal distension (gaseous): Secondary | ICD-10-CM

## 2017-06-24 DIAGNOSIS — R7402 Elevation of levels of lactic acid dehydrogenase (LDH): Secondary | ICD-10-CM

## 2017-06-24 DIAGNOSIS — R740 Nonspecific elevation of levels of transaminase and lactic acid dehydrogenase [LDH]: Secondary | ICD-10-CM

## 2017-06-24 DIAGNOSIS — R1011 Right upper quadrant pain: Secondary | ICD-10-CM

## 2017-06-24 DIAGNOSIS — R61 Generalized hyperhidrosis: Secondary | ICD-10-CM

## 2017-06-24 NOTE — Telephone Encounter (Signed)
I called patient; she is worried She is worried that she might have cancer, having night sweats; no weight loss; also on strict diet, has intolerance to a bunch of food, strict diet She is having occasional abd pain, right side in the middle I explained that I am here for her; appreciate her mother letting us know her concerns With ongoing night sweats, will get the CT scan abd/pelvis (patient says insurance denied earlier) I'm entering another order and will try to get this approved I'll see her Wed for appt

## 2017-06-24 NOTE — Telephone Encounter (Signed)
Copied from [place] [id number]. Topic: Quick Communication - See Telephone Encounter >> Jun 24, 2017 12:08 PM Ether Griffins B wrote: CRM for notification. See Telephone encounter for:  Pt's mom Kem Parkinson on [place]) calling in wanting to make Dr. Sanda Klein aware that pt has had some lab work done over the year that has come back with high liver functiond. Pt is very worked up and anxious about this. She has been online googling symptoms and has self diagnosed herself with lymphoma and couldn't go to work today due to being so worked up. She has an appt on 06/26/17 with Dr. Sanda Klein. Her step grandpa is in the hospital now and has been diagnosed with lymphoma. Mom just wanting to make Dr. Sanda Klein aware before coming in to the appt and discussing in front of daughter. She states daughter has in her mind that she has this and nothing is being done.   06/24/17.

## 2017-06-26 ENCOUNTER — Ambulatory Visit (INDEPENDENT_AMBULATORY_CARE_PROVIDER_SITE_OTHER): Payer: 59 | Admitting: Family Medicine

## 2017-06-26 ENCOUNTER — Encounter: Payer: Self-pay | Admitting: Family Medicine

## 2017-06-26 VITALS — BP 108/80 | HR 100 | Temp 98.1°F | Wt 127.2 lb

## 2017-06-26 DIAGNOSIS — E559 Vitamin D deficiency, unspecified: Secondary | ICD-10-CM | POA: Diagnosis not present

## 2017-06-26 DIAGNOSIS — D72820 Lymphocytosis (symptomatic): Secondary | ICD-10-CM

## 2017-06-26 DIAGNOSIS — R740 Nonspecific elevation of levels of transaminase and lactic acid dehydrogenase [LDH]: Secondary | ICD-10-CM

## 2017-06-26 DIAGNOSIS — R7402 Elevation of levels of lactic acid dehydrogenase (LDH): Secondary | ICD-10-CM

## 2017-06-26 DIAGNOSIS — R7401 Elevation of levels of liver transaminase levels: Secondary | ICD-10-CM

## 2017-06-26 DIAGNOSIS — R899 Unspecified abnormal finding in specimens from other organs, systems and tissues: Secondary | ICD-10-CM

## 2017-06-26 DIAGNOSIS — R768 Other specified abnormal immunological findings in serum: Secondary | ICD-10-CM | POA: Diagnosis not present

## 2017-06-26 MED ORDER — NORETHINDRONE 0.35 MG PO TABS: 1.0000 | ORAL_TABLET | Freq: Every day | ORAL | 11 refills | Status: DC

## 2017-06-26 NOTE — Progress Notes (Signed)
BP 108/80 (BP Location: Right Arm, Patient Position: Sitting, Cuff Size: Normal)   Pulse 100   Temp 98.1 F (36.7 C) (Oral)   Wt 127 lb 3.2 oz (57.7 kg)   LMP 06/09/2017   SpO2 99%   BMI 24.84 kg/m    Subjective:    Patient ID: Hayley Hunt, female    DOB: 03-18-1991, 27 y.o.   MRN: [id number]  HPI: GAY MONCIVAIS is a 27 y.o. female  Chief Complaint  Patient presents with  . Follow-up    HPI  Patient is here for f/u Still having some RUQ tenderness around the back too Has seen the surgeon about the imaging; also saw the GI specialist Borderline allergy / intolerance to wheat and eggs and malt and peanuts and milk and corn and maybe soy (???), patient says soy is okay Has lost five pounds but has really changed diet She is having night sweats, just over the shoulders; not soaking the sheets; happening at night No fevers Saw GI doctor; they checked levels; he said it is not your liver   Ears are stopped up; having drainage in the back of the throat Saw ENT two years ago and has acid reflux; a little sore with chronic cough Took acid reducer but made her sleepy Asked about trigger foods, nothing in particular; never really had the heartburn Throat feels like it closing a little; feels swollen on the inside Feels like a knocking sensation, does not feel weird when touching  Not taking medicines for a while, no nasal spray even Just OCP Had a migraine and her right arm went numb; imaged her head, 03/2016; has aura She went to OB-GYN and he put her on it Used to get cysts; bad mood swings Could not get pregnant right now she says  LFTs went up and then came right back down; not suspected to be drug reaction Mildly elevated alpha-1 AT; had brochitis at the time; also had mildly low IgA Mildly elevated ceruloplasmin on previous lab review  Depression screen [facility] 2/9 06/26/2017 04/30/2017 02/27/2017 01/24/2017 08/28/2016  Decreased Interest 0 0 0 0 0  Down, Depressed,  Hopeless 0 0 0 0 0  PHQ - 2 Score 0 0 0 0 0    Relevant past medical, surgical, family and social history reviewed Past Medical History:  Diagnosis Date  . Allergic asthma without acute exacerbation or status asthmaticus   . Asthma, mild 09/13/2015  . Common ichthyosis   . Eczema   . History of abnormal cervical Pap smear   . Ovarian cyst    History reviewed. No pertinent surgical history. Family History  Problem Relation Age of Onset  . Asthma Mother   . Hypothyroidism Mother   . Osteoporosis Maternal Grandmother   . Heart attack Maternal Grandfather   . Heart disease Maternal Grandfather   . Cancer Paternal Grandfather        lung  . Breast cancer Neg Hx   . Ovarian cancer Neg Hx    Social History   Tobacco Use  . Smoking status: Never Smoker  . Smokeless tobacco: Never Used  Substance Use Topics  . Alcohol use: No    Alcohol/week: 0.0 oz  . Drug use: No    Interim medical history since last visit reviewed. Allergies and medications reviewed  Review of Systems Per HPI unless specifically indicated above     Objective:    BP 108/80 (BP Location: Right Arm, Patient Position: Sitting, Cuff Size: Normal)  Pulse 100   Temp 98.1 F (36.7 C) (Oral)   Wt 127 lb 3.2 oz (57.7 kg)   LMP 06/09/2017   SpO2 99%   BMI 24.84 kg/m   Wt Readings from Last 3 Encounters:  06/26/17 127 lb 3.2 oz (57.7 kg)  05/14/17 130 lb 12.8 oz (59.3 kg)  05/09/17 135 lb (61.2 kg)    Physical Exam  Constitutional: She appears well-developed and well-nourished.  HENT:  Mouth/Throat: Mucous membranes are normal.  Eyes: EOM are normal. No scleral icterus.  Cardiovascular: Normal rate and regular rhythm.  Pulmonary/Chest: Effort normal and breath sounds normal.  Abdominal: Soft. Normal appearance and bowel sounds are normal. She exhibits no shifting dullness, no distension, no pulsatile liver, no abdominal bruit, no ascites, no pulsatile midline mass and no mass. There is no  hepatosplenomegaly. There is tenderness in the right upper quadrant. There is guarding. There is no rigidity, no tenderness at McBurney's point and negative Murphy's sign. No hernia.  Skin: She is not diaphoretic. No pallor.  Psychiatric: Her behavior is normal. Her mood appears anxious.        Assessment & Plan:   Problem List Items Addressed This Visit      Other   Elevated LDH - Primary   Relevant Orders   Lactate Dehydrogenase (LDH)   LDH Isoenzyme (Completed)   Elevated serum glutamic pyruvic transaminase (SGPT) level   Relevant Orders   Comprehensive metabolic panel (Completed)    Other Visit Diagnoses    Low serum IgA for age       Relevant Orders   IgG, IgA, IgM (Completed)   Abnormal laboratory test result       Relevant Orders   Ceruloplasmin (Completed)   Ferritin (Completed)   Alpha-1-Antitrypsin (Completed)   Vitamin D deficiency       Elevated lymphocytes       Relevant Orders   CBC with Differential/Platelet (Completed)      Follow up plan: No Follow-up on file.  An after-visit summary was printed and given to the patient at [place].  Please see the patient instructions which may contain other information and recommendations beyond what is mentioned above in the assessment and plan.  Meds ordered this encounter  Medications  . norethindrone (MICRONOR,CAMILA,ERRIN) 0.35 MG tablet    Sig: Take 1 tablet (0.35 mg total) by mouth daily.    Dispense:  1 Package    Refill:  11    Changing her OCP because she has migraines with aura    Orders Placed This Encounter  Procedures  . Lactate Dehydrogenase (LDH)  . LDH Isoenzyme  . Ceruloplasmin  . Comprehensive metabolic panel  . IgG, IgA, IgM  . Ferritin  . Alpha-1-Antitrypsin  . CBC with Differential/Platelet

## 2017-06-26 NOTE — Patient Instructions (Addendum)
Check out [employer] and other websites for [employer] ideas and recipes Try meatless crumbles, Boca burgers, Sweet Earth seitan, [place] vegan sausage mix (all plant protein), Sweet Earth [place] breakfast burritos, etc. You can further limit intake of saturated fats by switching to dairy free products (coconut milk coffee creamer, almond milk, Tofutti brand cream cheese and sour cream, Follow Your Heart vegenaise (mayonnaise alternative) and Follow Your Heart cheese alternative (the Antoine Primas is my favorite), new favorite is [employer] milk has more calcium than soy milk STOP your current birth control pills Start the new one [email or web address]-[email or web address]/ Please have the labs done at your convenience  [facility] 7:45 am, nothing to eat or drink after midnight Pick up prep the day before

## 2017-06-28 DIAGNOSIS — R899 Unspecified abnormal finding in specimens from other organs, systems and tissues: Secondary | ICD-10-CM | POA: Diagnosis not present

## 2017-06-28 DIAGNOSIS — R768 Other specified abnormal immunological findings in serum: Secondary | ICD-10-CM | POA: Diagnosis not present

## 2017-06-28 DIAGNOSIS — R740 Nonspecific elevation of levels of transaminase and lactic acid dehydrogenase [LDH]: Secondary | ICD-10-CM | POA: Diagnosis not present

## 2017-07-01 ENCOUNTER — Encounter: Payer: Self-pay | Admitting: Family Medicine

## 2017-07-01 DIAGNOSIS — J069 Acute upper respiratory infection, unspecified: Secondary | ICD-10-CM | POA: Diagnosis not present

## 2017-07-02 ENCOUNTER — Ambulatory Visit: Payer: 59 | Admitting: Gastroenterology

## 2017-07-03 LAB — CBC WITH DIFFERENTIAL/PLATELET
Basophils Absolute: 0 10*3/uL (ref 0.0–0.2)
Basos: 1 %
EOS (ABSOLUTE): 0.2 10*3/uL (ref 0.0–0.4)
Eos: 4 %
Hematocrit: 40.8 % (ref 34.0–46.6)
Hemoglobin: 13.9 g/dL (ref 11.1–15.9)
Immature Grans (Abs): 0 10*3/uL (ref 0.0–0.1)
Immature Granulocytes: 0 %
Lymphocytes Absolute: 2.9 10*3/uL (ref 0.7–3.1)
Lymphs: 55 %
MCH: 29.6 pg (ref 26.6–33.0)
MCHC: 34.1 g/dL (ref 31.5–35.7)
MCV: 87 fL (ref 79–97)
Monocytes Absolute: 0.4 10*3/uL (ref 0.1–0.9)
Monocytes: 8 %
Neutrophils Absolute: 1.7 10*3/uL (ref 1.4–7.0)
Neutrophils: 32 %
Platelets: 255 10*3/uL (ref 150–379)
RBC: 4.69 x10E6/uL (ref 3.77–5.28)
RDW: 13.3 % (ref 12.3–15.4)
WBC: 5.3 10*3/uL (ref 3.4–10.8)

## 2017-07-03 LAB — COMPREHENSIVE METABOLIC PANEL
ALT: 17 IU/L (ref 0–32)
AST: 17 IU/L (ref 0–40)
Albumin/Globulin Ratio: 2 (ref 1.2–2.2)
Albumin: 4.7 g/dL (ref 3.5–5.5)
Alkaline Phosphatase: 52 IU/L (ref 39–117)
BUN/Creatinine Ratio: 9 (ref 9–23)
BUN: 6 mg/dL (ref 6–20)
Bilirubin Total: 0.3 mg/dL (ref 0.0–1.2)
CO2: 23 mmol/L (ref 20–29)
Calcium: 9.6 mg/dL (ref 8.7–10.2)
Chloride: 102 mmol/L (ref 96–106)
Creatinine, Ser: 0.68 mg/dL (ref 0.57–1.00)
GFR calc Af Amer: 139 mL/min/{1.73_m2} (ref >59)
GFR calc non Af Amer: 120 mL/min/{1.73_m2} (ref >59)
Globulin, Total: 2.3 g/dL (ref 1.5–4.5)
Glucose: 80 mg/dL (ref 65–99)
Potassium: 4.2 mmol/L (ref 3.5–5.2)
Sodium: 140 mmol/L (ref 134–144)
Total Protein: 7 g/dL (ref 6.0–8.5)

## 2017-07-03 LAB — IGG, IGA, IGM
IgA/Immunoglobulin A, Serum: 84 mg/dL — ABNORMAL LOW (ref 87–352)
IgG (Immunoglobin G), Serum: 836 mg/dL (ref 700–1600)
IgM (Immunoglobulin M), Srm: 60 mg/dL (ref 26–217)

## 2017-07-03 LAB — LACTATE DEHYDROGENASE, ISOENZYMES
(LD) Fraction 1: 22 % (ref 17–32)
(LD) Fraction 2: 33 % (ref 25–40)
(LD) Fraction 3: 25 % (ref 17–27)
(LD) Fraction 4: 11 % (ref 5–13)
(LD) Fraction 5: 9 % (ref 4–20)
LDH: 200 IU/L (ref 119–226)

## 2017-07-03 LAB — CERULOPLASMIN: Ceruloplasmin: 44.9 mg/dL — ABNORMAL HIGH (ref 19.0–39.0)

## 2017-07-03 LAB — ALPHA-1-ANTITRYPSIN: A-1 Antitrypsin: 177 mg/dL (ref 90–200)

## 2017-07-03 LAB — FERRITIN: Ferritin: 29 ng/mL (ref 15–150)

## 2017-07-10 ENCOUNTER — Ambulatory Visit
Admission: RE | Admit: 2017-07-10 | Discharge: 2017-07-10 | Disposition: A | Discharge status: Home / Self Care | Payer: 59 | Source: Ambulatory Visit | Attending: Family Medicine | Admitting: Family Medicine

## 2017-07-10 DIAGNOSIS — R1011 Right upper quadrant pain: Secondary | ICD-10-CM

## 2017-07-10 DIAGNOSIS — R740 Nonspecific elevation of levels of transaminase and lactic acid dehydrogenase [LDH]: Secondary | ICD-10-CM | POA: Insufficient documentation

## 2017-07-10 DIAGNOSIS — R61 Generalized hyperhidrosis: Secondary | ICD-10-CM | POA: Insufficient documentation

## 2017-07-10 DIAGNOSIS — R109 Unspecified abdominal pain: Secondary | ICD-10-CM | POA: Diagnosis not present

## 2017-07-10 DIAGNOSIS — R140 Abdominal distension (gaseous): Secondary | ICD-10-CM | POA: Diagnosis not present

## 2017-07-10 DIAGNOSIS — R7402 Elevation of levels of lactic acid dehydrogenase (LDH): Secondary | ICD-10-CM

## 2017-07-10 MED ORDER — IOPAMIDOL (ISOVUE-300) INJECTION 61%
100.0000 mL | Freq: Once | INTRAVENOUS | Status: AC | PRN
  Administered 2017-07-10: 100 mL via INTRAVENOUS

## 2017-09-06 ENCOUNTER — Other Ambulatory Visit: Payer: Self-pay

## 2017-09-06 DIAGNOSIS — R748 Abnormal levels of other serum enzymes: Secondary | ICD-10-CM

## 2017-09-28 ENCOUNTER — Emergency Department
Admission: EM | Admit: 2017-09-28 | Discharge: 2017-09-28 | Disposition: A | Discharge status: Other Acute Inpatient Hospital | Payer: 59 | Attending: Emergency Medicine | Admitting: Emergency Medicine

## 2017-09-28 ENCOUNTER — Encounter: Payer: Self-pay | Admitting: Emergency Medicine

## 2017-09-28 ENCOUNTER — Other Ambulatory Visit: Payer: Self-pay

## 2017-09-28 DIAGNOSIS — Z9989 Dependence on other enabling machines and devices: Secondary | ICD-10-CM | POA: Diagnosis not present

## 2017-09-28 DIAGNOSIS — L500 Allergic urticaria: Secondary | ICD-10-CM | POA: Diagnosis not present

## 2017-09-28 DIAGNOSIS — L858 Other specified epidermal thickening: Secondary | ICD-10-CM | POA: Diagnosis not present

## 2017-09-28 DIAGNOSIS — Z79899 Other long term (current) drug therapy: Secondary | ICD-10-CM | POA: Diagnosis not present

## 2017-09-28 DIAGNOSIS — D721 Eosinophilia: Secondary | ICD-10-CM | POA: Diagnosis not present

## 2017-09-28 DIAGNOSIS — R509 Fever, unspecified: Secondary | ICD-10-CM | POA: Insufficient documentation

## 2017-09-28 DIAGNOSIS — R6510 Systemic inflammatory response syndrome (SIRS) of non-infectious origin without acute organ dysfunction: Secondary | ICD-10-CM | POA: Insufficient documentation

## 2017-09-28 DIAGNOSIS — T373X5A Adverse effect of other antiprotozoal drugs, initial encounter: Secondary | ICD-10-CM | POA: Diagnosis not present

## 2017-09-28 DIAGNOSIS — L308 Other specified dermatitis: Secondary | ICD-10-CM | POA: Diagnosis not present

## 2017-09-28 DIAGNOSIS — J45909 Unspecified asthma, uncomplicated: Secondary | ICD-10-CM | POA: Insufficient documentation

## 2017-09-28 DIAGNOSIS — L270 Generalized skin eruption due to drugs and medicaments taken internally: Secondary | ICD-10-CM | POA: Diagnosis not present

## 2017-09-28 DIAGNOSIS — T887XXA Unspecified adverse effect of drug or medicament, initial encounter: Secondary | ICD-10-CM | POA: Diagnosis not present

## 2017-09-28 DIAGNOSIS — R21 Rash and other nonspecific skin eruption: Secondary | ICD-10-CM | POA: Insufficient documentation

## 2017-09-28 DIAGNOSIS — A419 Sepsis, unspecified organism: Secondary | ICD-10-CM | POA: Diagnosis not present

## 2017-09-28 LAB — URINALYSIS, ROUTINE W REFLEX MICROSCOPIC
Bilirubin Urine: NEGATIVE
Glucose, UA: NEGATIVE mg/dL
Hgb urine dipstick: NEGATIVE
Ketones, ur: NEGATIVE mg/dL
Leukocytes, UA: NEGATIVE
Nitrite: NEGATIVE
Protein, ur: NEGATIVE mg/dL
Specific Gravity, Urine: 1.011 (ref 1.005–1.030)
pH: 6 (ref 5.0–8.0)

## 2017-09-28 LAB — MONONUCLEOSIS SCREEN: Mono Screen: NEGATIVE

## 2017-09-28 LAB — COMPREHENSIVE METABOLIC PANEL
ALT: 24 U/L (ref 14–54)
AST: 23 U/L (ref 15–41)
Albumin: 4.3 g/dL (ref 3.5–5.0)
Alkaline Phosphatase: 55 U/L (ref 38–126)
Anion gap: 10 (ref 5–15)
BUN: 11 mg/dL (ref 6–20)
CO2: 20 mmol/L — ABNORMAL LOW (ref 22–32)
Calcium: 8.5 mg/dL — ABNORMAL LOW (ref 8.9–10.3)
Chloride: 105 mmol/L (ref 101–111)
Creatinine, Ser: 0.7 mg/dL (ref 0.44–1.00)
GFR calc Af Amer: >60 mL/min (ref >60)
GFR calc non Af Amer: >60 mL/min (ref >60)
Glucose, Bld: 114 mg/dL — ABNORMAL HIGH (ref 65–99)
Potassium: 3.4 mmol/L — ABNORMAL LOW (ref 3.5–5.1)
Sodium: 135 mmol/L (ref 135–145)
Total Bilirubin: 1.3 mg/dL — ABNORMAL HIGH (ref 0.3–1.2)
Total Protein: 6.8 g/dL (ref 6.5–8.1)

## 2017-09-28 LAB — CBC WITH DIFFERENTIAL/PLATELET
Basophils Absolute: 0 10*3/uL (ref 0–0.1)
Basophils Relative: 0 %
Eosinophils Absolute: 0.3 10*3/uL (ref 0–0.7)
Eosinophils Relative: 3 %
HCT: 39.7 % (ref 35.0–47.0)
Hemoglobin: 14.1 g/dL (ref 12.0–16.0)
Lymphocytes Relative: 10 %
Lymphs Abs: 0.9 10*3/uL — ABNORMAL LOW (ref 1.0–3.6)
MCH: 31 pg (ref 26.0–34.0)
MCHC: 35.4 g/dL (ref 32.0–36.0)
MCV: 87.5 fL (ref 80.0–100.0)
Monocytes Absolute: 0.2 10*3/uL (ref 0.2–0.9)
Monocytes Relative: 2 %
Neutro Abs: 7.9 10*3/uL — ABNORMAL HIGH (ref 1.4–6.5)
Neutrophils Relative %: 85 %
Platelets: 254 10*3/uL (ref 150–440)
RBC: 4.54 MIL/uL (ref 3.80–5.20)
RDW: 14.1 % (ref 11.5–14.5)
WBC: 9.3 10*3/uL (ref 3.6–11.0)

## 2017-09-28 LAB — LACTIC ACID, PLASMA: Lactic Acid, Venous: 1.1 mmol/L (ref 0.5–1.9)

## 2017-09-28 MED ORDER — VANCOMYCIN HCL IN DEXTROSE 1-5 GM/200ML-% IV SOLN
1000.0000 mg | Freq: Once | INTRAVENOUS | Status: DC
  Filled 2017-09-28: qty 200

## 2017-09-28 MED ORDER — ACETAMINOPHEN 500 MG PO TABS
1000.0000 mg | ORAL_TABLET | Freq: Once | ORAL | Status: AC
  Administered 2017-09-28: 1000 mg via ORAL
  Filled 2017-09-28: qty 2

## 2017-09-28 MED ORDER — LEVOFLOXACIN IN D5W 750 MG/150ML IV SOLN
750.0000 mg | Freq: Once | INTRAVENOUS | Status: AC
  Administered 2017-09-28: 750 mg via INTRAVENOUS
  Filled 2017-09-28: qty 150

## 2017-09-28 MED ORDER — SODIUM CHLORIDE 0.9 % IV SOLN
2.0000 g | Freq: Once | INTRAVENOUS | Status: AC
  Administered 2017-09-28: 2 g via INTRAVENOUS
  Filled 2017-09-28: qty 2

## 2017-09-28 MED ORDER — DIPHENHYDRAMINE HCL 50 MG/ML IJ SOLN
25.0000 mg | Freq: Once | INTRAMUSCULAR | Status: AC
  Administered 2017-09-28: 25 mg via INTRAVENOUS
  Filled 2017-09-28: qty 1

## 2017-09-28 MED ORDER — SODIUM CHLORIDE 0.9 % IV BOLUS (SEPSIS)
1000.0000 mL | Freq: Once | INTRAVENOUS | Status: AC
  Administered 2017-09-28: 1000 mL via INTRAVENOUS

## 2017-09-28 MED ORDER — SODIUM CHLORIDE 0.9 % IV BOLUS (SEPSIS)
250.0000 mL | Freq: Once | INTRAVENOUS | Status: AC
  Administered 2017-09-28: 250 mL via INTRAVENOUS

## 2017-09-28 MED ORDER — FAMOTIDINE IN NACL 20-0.9 MG/50ML-% IV SOLN
20.0000 mg | Freq: Once | INTRAVENOUS | Status: AC
  Administered 2017-09-28: 20 mg via INTRAVENOUS
  Filled 2017-09-28: qty 50

## 2017-09-28 MED ORDER — SODIUM CHLORIDE 0.9 % IV BOLUS (SEPSIS)
500.0000 mL | Freq: Once | INTRAVENOUS | Status: AC
  Administered 2017-09-28: 500 mL via INTRAVENOUS

## 2017-09-28 MED ORDER — DEXAMETHASONE SODIUM PHOSPHATE 10 MG/ML IJ SOLN
10.0000 mg | Freq: Once | INTRAMUSCULAR | Status: AC
  Administered 2017-09-28: 10 mg via INTRAVENOUS
  Filled 2017-09-28: qty 1

## 2017-09-28 MED ORDER — SODIUM CHLORIDE 0.9 % IV BOLUS
1000.0000 mL | Freq: Once | INTRAVENOUS | Status: AC
  Administered 2017-09-28: 1000 mL via INTRAVENOUS

## 2017-09-28 NOTE — ED Triage Notes (Signed)
Pt to ed with c/o allergic reaction to meds.  Pt states she had wisdom teeth removed 3 weeks ago, was started on z pack for infection but infection did not get better so she was changed to clindimycin and then returned to have it checked and was started on flagyl.  Yesterday started with allergic reaction, swelling to face and lips, entire body covered in hives at this time.  States she took benadryl 25 mg po at 0730.  Denies difficulty breathing.

## 2017-09-28 NOTE — ED Notes (Signed)
Pt had wisdom teeth May 17 extracted left side infection r/s dry socket. Pt reseen on may 22 that is when pt received zpak, pt took the whole Zpack and went back for pain infection still present and was given clyndamycin. 6/5 they added flagyl to the pt after a wash out. Pt returned yesterday for repacking, pt showed rash to dentist he then took her off of both antibiotics and gave her a zpack to which pt has not started yet.    Pt is NAD but present with rash that comes from feet to head, pt states it itches. Pt states she takes 25 mgat 0730 this morning, and pt took it yesterday as well.   Awaiting EDP

## 2017-09-28 NOTE — ED Notes (Signed)
emtala reviewed by this RN 

## 2017-09-28 NOTE — ED Notes (Signed)
EDP at bedside  

## 2017-09-28 NOTE — Progress Notes (Addendum)
CODE SEPSIS - PHARMACY COMMUNICATION  **Broad Spectrum Antibiotics should be administered within 1 hour of Sepsis diagnosis**  Time Code Sepsis Called/Page Received: 1020  Antibiotics Ordered: Aztreonam, Vanco and Levofloxacin   Time of 1st antibiotic administration: Aztreonam @ 1050, Levofloxacin @ 1056  Additional action taken by pharmacy: N/A  If necessary, Name of Provider/Nurse Contacted: [place] ,PharmD Pharmacy Resident  09/28/2017  10:51 AM

## 2017-09-28 NOTE — ED Provider Notes (Signed)
[facility] Emergency Department Provider Note  ___________________________________________   First MD Initiated Contact with Patient 09/28/17 [phone]     (approximate)  I have reviewed the triage vital signs and the nursing notes.   HISTORY  Chief Complaint Allergic Reaction   HPI Hayley Hunt is a 27 y.o. female with a history of eczema as well as penicillin allergy with reaction of rash was presented to the emergency department today with fever and diffuse body rash.  Patient says that the rash started yesterday on her left hip and was several red papules.  However, it has spread diffusely covering her head, face, neck chest, back, abdomen as well as her bilateral upper and lower extremities.  She says that the lesions have now extended onto her palms and soles.  She says the rash is itchy.  Is suspecting it may have come from antibiotics.  The patient has been on clindamycin as well as Flagyl.  Before that she was also on azithromycin.  She has been on multiple antibiotics because of dental work that was done in late May.  Says that she first started taking azithromycin on May 22 after having a dental infection on the left side.  She denies any drainage.  Says that the pain as well as swelling has decreased over time since having the dental work done.  Past Medical History:  Diagnosis Date  . Allergic asthma without acute exacerbation or status asthmaticus   . Asthma, mild 09/13/2015  . Common ichthyosis   . Eczema   . History of abnormal cervical Pap smear   . Ovarian cyst     Patient Active Problem List   Diagnosis Date Noted  . Gallbladder sludge 05/07/2017  . Elevated LDH 05/01/2017  . Elevated serum glutamic pyruvic transaminase (SGPT) level 05/01/2017  . Nipple crusting 09/10/2016  . Abnormal appearance of cervix 04/29/2016  . Fatigue 04/27/2016  . Preventative health care 04/27/2016  . Laryngopharyngeal reflux (LPR) 12/25/2015  . Asthma, mild  09/13/2015  . Abdominal bloating 07/29/2015  . Postprandial abdominal bloating 07/29/2015  . Change in stool habits 07/29/2015  . Intestinal gas excretion 07/29/2015  . Abdominal pain, lower 07/29/2015  . Annual physical exam 04/04/2015  . Encounter for screening for malignant neoplasm of cervix 04/04/2015  . Allergic rhinitis 11/22/2014  . Dermatitis, eczematoid 11/22/2014  . H/O abnormal cervical Papanicolaou smear 11/22/2014  . Wrist pain, right 11/22/2014    History reviewed. No pertinent surgical history.  Prior to Admission medications   Medication Sig Start Date End Date Taking? Authorizing Provider  albuterol (VENTOLIN HFA) 108 (90 BASE) MCG/ACT inhaler Inhale 1-2 puffs into the lungs every 6 (six) hours as needed. 05/27/14   [provider]  ARNUITY ELLIPTA 100 MCG/ACT AEPB Inhale 1 puff into the lungs daily. 08/26/16   [provider]  Azelastine-Fluticasone (DYMISTA) 137-50 MCG/ACT SUSP Place 2 sprays into the nose daily.    [provider]  ipratropium-albuterol (DUONEB) 0.5-2.5 (3) MG/3ML SOLN Take 3 mLs by nebulization every 6 (six) hours as needed. 01/24/17   Arnetha Courser, MD  loratadine (CLARITIN) 10 MG tablet Take 1 tablet (10 mg total) by mouth daily as needed for allergies. 01/24/17   Lada, Satira Anis, MD  LORazepam (ATIVAN) 0.5 MG tablet Take 0.5-1 tablets (0.25-0.5 mg total) by mouth 2 (two) times daily as needed for anxiety. 05/11/17   Lada, Satira Anis, MD  montelukast (SINGULAIR) 10 MG tablet Take 10 mg every evening by mouth.  02/21/17   [provider]  norethindrone (MICRONOR,CAMILA,ERRIN) 0.35 MG tablet Take 1 tablet (0.35 mg total) by mouth daily. 06/26/17   Arnetha Courser, MD    Allergies Penicillins and Latex  Family History  Problem Relation Age of Onset  . Asthma Mother   . Hypothyroidism Mother   . Osteoporosis Maternal Grandmother   . Heart attack Maternal Grandfather   . Heart disease Maternal Grandfather   . Cancer  Paternal Grandfather        lung  . Breast cancer Neg Hx   . Ovarian cancer Neg Hx     Social History Social History   Tobacco Use  . Smoking status: Never Smoker  . Smokeless tobacco: Never Used  Substance Use Topics  . Alcohol use: No    Alcohol/week: 0.0 oz  . Drug use: No    Review of Systems  Constitutional: Fever Eyes: No visual changes. ENT: No sore throat. Cardiovascular: Denies chest pain. Respiratory: Denies shortness of breath. Gastrointestinal: No abdominal pain.  No nausea, no vomiting.  No diarrhea.  No constipation. Genitourinary: Negative for dysuria. Musculoskeletal: Negative for back pain. Skin: As above  neurological: Negative for headaches, focal weakness or numbness.   ____________________________________________   PHYSICAL EXAM:  VITAL SIGNS: ED Triage Vitals  Enc Vitals Group     BP 09/28/17 0858 (!) 105/59     Pulse Rate 09/28/17 0858 (!) 140     Resp 09/28/17 0858 18     Temp 09/28/17 0858 (!) 101.3 F (38.5 C)     Temp Source 09/28/17 0858 Oral     SpO2 09/28/17 0858 100 %     Weight 09/28/17 0859 127 lb (57.6 kg)     Height --      Head Circumference --      Peak Flow --      Pain Score 09/28/17 0859 5     Pain Loc --      Pain Edu? --      Excl. in [place]? --     Constitutional: Alert and oriented. in no acute distress. Eyes: Conjunctivae are normal.  Head: Atraumatic. Nose: No congestion/rhinnorhea. Mouth/Throat: Mucous membranes are moist.  No intraoral lesions.  No crusting of the lips.  No tonsillar nor is there any uvular swelling.  No tongue swelling.  No swelling around the gumline.  Able to visualize the surgical sites where the molars were removed and there does not appear to be any exudates or tenderness.  No swelling or lymphadenopathy to the submandibular tissues nor to the neck, anteriorly. Neck: No stridor.   Cardiovascular: Tachycardic, regular rhythm. Grossly normal heart sounds.  No murmurs. Respiratory: Normal  respiratory effort.  No retractions. Lungs CTAB. Gastrointestinal: Soft and nontender. No distention. No CVA tenderness. Musculoskeletal: No lower extremity tenderness nor edema.  No joint effusions. Neurologic:  Normal speech and language. No gross focal neurologic deficits are appreciated. Skin: Diffuse, maculopapular rash from the head down to the feet and with minimal involvement but several scattered lesions on the palms and soles.  No intraoral lesions.  Rash is blanchable.  There are no bullae or vesicles.  Rash coalesces more proximally and is more scattered distally to the bilateral upper and lower extremities.  No exudate.  No splinter hemorrhages nor there are any Osler's nodes. Psychiatric: Mood and affect are normal. Speech and behavior are normal.  ____________________________________________   LABS (all labs ordered are listed, but only abnormal results are displayed)  Labs Reviewed  CBC WITH DIFFERENTIAL/PLATELET - Abnormal; Notable for the following components:      Result Value   Neutro Abs 7.9 (*)    Lymphs Abs 0.9 (*)    All other components within normal limits  COMPREHENSIVE METABOLIC PANEL - Abnormal; Notable for the following components:   Potassium 3.4 (*)    CO2 20 (*)    Glucose, Bld 114 (*)    Calcium 8.5 (*)    Total Bilirubin 1.3 (*)    All other components within normal limits  CULTURE, BLOOD (ROUTINE X 2)  CULTURE, BLOOD (ROUTINE X 2)  LACTIC ACID, PLASMA  LACTIC ACID, PLASMA  URINALYSIS, ROUTINE W REFLEX MICROSCOPIC  ROCKY MTN SPOTTED FVR ABS PNL(IGG+IGM)  MONONUCLEOSIS SCREEN  POC URINE PREG, ED   ____________________________________________  EKG   ____________________________________________  RADIOLOGY   ____________________________________________   PROCEDURES  Procedure(s) performed:   .Critical Care Performed by: Orbie Pyo, MD Authorized by: Orbie Pyo, MD   Critical care provider statement:     Critical care time (minutes):  35   Critical care time was exclusive of:  Separately billable procedures and treating other patients   Critical care was necessary to treat or prevent imminent or life-threatening deterioration of the following conditions:  Sepsis   Critical care was time spent personally by me on the following activities:  Development of treatment plan with patient or surrogate, discussions with consultants, evaluation of patient's response to treatment, examination of patient, obtaining history from patient or surrogate, ordering and performing treatments and interventions, ordering and review of laboratory studies, ordering and review of radiographic studies, pulse oximetry, re-evaluation of patient's condition and review of old charts    Critical Care performed:    ____________________________________________   INITIAL IMPRESSION / [place] / ED COURSE  Pertinent labs & imaging results that were available during my care of the patient were reviewed by me and considered in my medical decision making (see chart for details).  DDX: Allergic reaction, staph scalded skin, toxic shock, sepsis, endocarditis, dental infection, anaphylaxis, fever As part of my medical decision making, I reviewed the following data within the [facility] EKG reviewed and Notes from prior ED visits  ----------------------------------------- 12:26 PM on 09/28/2017 -----------------------------------------  Heart rate down to the 120s.  Rash appears slightly worsened with increased coalescing to the upper extremities.  Normal white blood cell count but with a left shift.  No elevated eosinophils.  Patient appears to have improved from the standpoint of her heart rate.  Also feels like "my temperatures down."  However, there is no improvement with the rash after Decadron, Benadryl and Pepcid.  Also received broad-spectrum antibiotics because of the fever.  Discussed the  case with Dr. Narda Bonds give dermatology on call here at the emergency department.  Broad differential including erythema multiforme, vasculitis, other rashes including [place] spotted fever.  Mono test ordered as well.  Dr. Nehemiah Massed and I discussed transfer of the patient to [facility] as the patient will not be able to be seen by dermatology here at [facility] until this Monday.  She may require a biopsy to help further diagnose which will be delayed if she stays here at [facility].  Patient aware of need for transfer.  We will hold off on additional antibiotics.  Discussed the case with Dr. Elgie Collard of the burn center at [facility] who accepts the patient. ____________________________________________   FINAL CLINICAL IMPRESSION(S) / ED DIAGNOSES  Rash.  Fever.  Sirs.  NEW MEDICATIONS STARTED DURING THIS VISIT:  New Prescriptions   No medications on file     Note:  This document was prepared using Dragon voice recognition software and may include unintentional dictation errors.     Orbie Pyo, MD 09/28/17 [phone]

## 2017-10-01 LAB — ROCKY MTN SPOTTED FVR ABS PNL(IGG+IGM)
RMSF IgG: NEGATIVE
RMSF IgM: 0.35 index (ref 0.00–0.89)

## 2017-10-03 LAB — CULTURE, BLOOD (ROUTINE X 2)
Culture: NO GROWTH
Culture: NO GROWTH
Special Requests: ADEQUATE
Special Requests: ADEQUATE

## 2017-10-17 ENCOUNTER — Telehealth: Payer: Self-pay | Admitting: Family Medicine

## 2017-10-17 DIAGNOSIS — R748 Abnormal levels of other serum enzymes: Secondary | ICD-10-CM | POA: Diagnosis not present

## 2017-10-17 NOTE — Telephone Encounter (Signed)
Copied from [place] [id number]. Topic: General - Other >> Oct 17, 2017  1:00 PM Margot Ables wrote: Pt had wisdom teeth removed 3-4 weeks ago, she has taken antibiotics, she was in the hospital after several rounds of antibiotics and having an allergic reaction. Now the top gums are infected from wisdom tooth removal. Pt is having a hard time and wondering if something else is going on. She is asking she needs to come in for labs/visit with Dr. Sanda Klein.

## 2017-10-17 NOTE — Telephone Encounter (Signed)
Let's see her on Monday at 10 am and double book

## 2017-10-18 LAB — HEPATIC FUNCTION PANEL
ALT: 18 IU/L (ref 0–32)
AST: 16 IU/L (ref 0–40)
Albumin: 4.6 g/dL (ref 3.5–5.5)
Alkaline Phosphatase: 56 IU/L (ref 39–117)
Bilirubin Total: 0.8 mg/dL (ref 0.0–1.2)
Bilirubin, Direct: 0.19 mg/dL (ref 0.00–0.40)
Total Protein: 6.8 g/dL (ref 6.0–8.5)

## 2017-10-18 NOTE — Telephone Encounter (Signed)
Pt scheduled  

## 2017-10-21 ENCOUNTER — Telehealth: Payer: Self-pay

## 2017-10-21 ENCOUNTER — Encounter: Payer: Self-pay | Admitting: Family Medicine

## 2017-10-21 ENCOUNTER — Ambulatory Visit: Payer: 59 | Admitting: Family Medicine

## 2017-10-21 VITALS — BP 104/66 | HR 76 | Temp 98.0°F | Resp 16 | Ht 60.0 in | Wt 127.8 lb

## 2017-10-21 DIAGNOSIS — D649 Anemia, unspecified: Secondary | ICD-10-CM | POA: Diagnosis not present

## 2017-10-21 DIAGNOSIS — K644 Residual hemorrhoidal skin tags: Secondary | ICD-10-CM | POA: Diagnosis not present

## 2017-10-21 DIAGNOSIS — R21 Rash and other nonspecific skin eruption: Secondary | ICD-10-CM | POA: Diagnosis not present

## 2017-10-21 NOTE — Telephone Encounter (Signed)
Mychart message sent to pt regarding normal lab result.

## 2017-10-21 NOTE — Patient Instructions (Signed)
Please have the labs done Try preparation H or other hemorrhoid topical agent for one to two weeks; if not resolving in two weeks, contact my office
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
## 2017-10-21 NOTE — Telephone Encounter (Signed)
-----   Message from Lucilla Lame, MD sent at 10/18/2017  3:00 PM EDT ----- Let the patient know that her liver enzymes are absolutely normal.

## 2017-10-22 LAB — CBC WITH DIFFERENTIAL/PLATELET
Basophils Absolute: 0 10*3/uL (ref 0.0–0.2)
Basos: 1 %
EOS (ABSOLUTE): 0.4 10*3/uL (ref 0.0–0.4)
Eos: 7 %
Hematocrit: 36.9 % (ref 34.0–46.6)
Hemoglobin: 12.7 g/dL (ref 11.1–15.9)
Immature Grans (Abs): 0 10*3/uL (ref 0.0–0.1)
Immature Granulocytes: 0 %
Lymphocytes Absolute: 2.7 10*3/uL (ref 0.7–3.1)
Lymphs: 45 %
MCH: 30.5 pg (ref 26.6–33.0)
MCHC: 34.4 g/dL (ref 31.5–35.7)
MCV: 89 fL (ref 79–97)
Monocytes Absolute: 0.4 10*3/uL (ref 0.1–0.9)
Monocytes: 7 %
Neutrophils Absolute: 2.4 10*3/uL (ref 1.4–7.0)
Neutrophils: 40 %
Platelets: 244 10*3/uL (ref 150–450)
RBC: 4.16 x10E6/uL (ref 3.77–5.28)
RDW: 14 % (ref 12.3–15.4)
WBC: 5.9 10*3/uL (ref 3.4–10.8)

## 2017-10-22 LAB — IRON,TIBC AND FERRITIN PANEL
Ferritin: 33 ng/mL (ref 15–150)
Iron Saturation: 13 % — ABNORMAL LOW (ref 15–55)
Iron: 49 ug/dL (ref 27–159)
Total Iron Binding Capacity: 373 ug/dL (ref 250–450)
UIBC: 324 ug/dL (ref 131–425)

## 2017-11-11 ENCOUNTER — Encounter: Payer: Self-pay | Admitting: Family Medicine

## 2017-11-11 DIAGNOSIS — K644 Residual hemorrhoidal skin tags: Secondary | ICD-10-CM

## 2017-12-17 ENCOUNTER — Ambulatory Visit (INDEPENDENT_AMBULATORY_CARE_PROVIDER_SITE_OTHER): Payer: 59 | Admitting: Gastroenterology

## 2017-12-17 ENCOUNTER — Encounter: Payer: Self-pay | Admitting: Gastroenterology

## 2017-12-17 VITALS — BP 105/68 | HR 84 | Resp 17 | Ht 60.0 in | Wt 129.6 lb

## 2017-12-17 DIAGNOSIS — K580 Irritable bowel syndrome with diarrhea: Secondary | ICD-10-CM | POA: Diagnosis not present

## 2017-12-17 DIAGNOSIS — K644 Residual hemorrhoidal skin tags: Secondary | ICD-10-CM | POA: Diagnosis not present

## 2017-12-17 NOTE — Progress Notes (Signed)
Cephas Darby, MD [address]  [place]  [place], [place] [address]  Main: [phone]  Fax: [phone]    Gastroenterology Consultation  Referring Provider:     Arnetha Courser, MD Primary Care Physician:  Arnetha Courser, MD Primary Gastroenterologist:  Dr. Cephas Darby Reason for Consultation:     IBS, symptomatic hemorrhoids        HPI:   Hayley Hunt is a 27 y.o. female referred by Dr. Sanda Klein, Satira Anis, MD  for consultation & management of IBS. She reports several years history of episodes of increased bowel frequency, nonbloody associated with significant bloating and cramps primarily triggered after eating certain foods like wheat, dairy, soy, eggs etc. She was tested for celiac disease and was negative in 2017. She reports that she could not tolerate milk even as a child. She was seen by allergist, dnd underwent extensive allergy testing and she did not have any known allergies to foods  About 2 months ago she had wisdom teeth extraction and was given metronidazole and amoxicillin, she developed rash. During that time, she had flareup of IBS symptoms and also felt a piece of tissue outside the anus, associated with blood on wiping, irritation and itching. She used preparation H after seeing Dr. Sanda Klein. Symptoms have subsided since then. She thinks she still has a small piece of tissue that she feels and wanted to make sure that it's not hemorrhoid She is no longer experiencing rectal bleeding.   NSAIDs: None  Antiplts/Anticoagulants/Anti thrombotics: none  GI Procedures: none No GI surgeries in the past No family history of GI malignancy, celiac disease, inflammatory bowel disease  Past Medical History:  Diagnosis Date  . Allergic asthma without acute exacerbation or status asthmaticus   . Asthma, mild 09/13/2015  . Common ichthyosis   . Eczema   . History of abnormal cervical Pap smear   . Ovarian cyst     No past surgical history on file.  Current  Outpatient Medications:  .  albuterol (VENTOLIN HFA) 108 (90 BASE) MCG/ACT inhaler, Inhale 1-2 puffs into the lungs every 6 (six) hours as needed., Disp: , Rfl:  .  norethindrone (MICRONOR,CAMILA,ERRIN) 0.35 MG tablet, Take 1 tablet (0.35 mg total) by mouth daily., Disp: 1 Package, Rfl: 11 .  ARNUITY ELLIPTA 100 MCG/ACT AEPB, Inhale 1 puff into the lungs daily., Disp: , Rfl: 0 .  Azelastine-Fluticasone (DYMISTA) 137-50 MCG/ACT SUSP, Place 2 sprays into the nose daily., Disp: , Rfl:  .  ipratropium-albuterol (DUONEB) 0.5-2.5 (3) MG/3ML SOLN, Take 3 mLs by nebulization every 6 (six) hours as needed., Disp: , Rfl:  .  loratadine (CLARITIN) 10 MG tablet, Take 1 tablet (10 mg total) by mouth daily as needed for allergies. (Patient not taking: Reported on 12/17/2017), Disp: 30 tablet, Rfl: 11 .  LORazepam (ATIVAN) 0.5 MG tablet, Take 0.5-1 tablets (0.25-0.5 mg total) by mouth 2 (two) times daily as needed for anxiety. (Patient not taking: Reported on 12/17/2017), Disp: 10 tablet, Rfl: 0 .  montelukast (SINGULAIR) 10 MG tablet, Take 10 mg every evening by mouth., Disp: , Rfl: 1   Family History  Problem Relation Age of Onset  . Asthma Mother   . Hypothyroidism Mother   . Osteoporosis Maternal Grandmother   . Heart attack Maternal Grandfather   . Heart disease Maternal Grandfather   . Cancer Paternal Grandfather        lung  . Breast cancer Neg Hx   . Ovarian cancer Neg  Hx      Social History   Tobacco Use  . Smoking status: Never Smoker  . Smokeless tobacco: Never Used  Substance Use Topics  . Alcohol use: No    Alcohol/week: 0.0 standard drinks  . Drug use: No    Allergies as of 12/17/2017 - Review Complete 12/17/2017  Allergen Reaction Noted  . Amoxicillin Hives and Rash 09/28/2017  . Clindamycin/lincomycin Rash 10/21/2017  . Metronidazole Itching, Other (See Comments), and Rash 09/28/2017  . Penicillins Anaphylaxis and Hives 11/22/2014  . Latex  11/22/2014    Review of Systems:     All systems reviewed and negative except where noted in HPI.   Physical Exam:  BP 105/68 (BP Location: Left Arm, Patient Position: Sitting, Cuff Size: Normal)   Pulse 84   Resp 17   Ht 5' (1.524 m)   Wt 129 lb 9.6 oz (58.8 kg)   BMI 25.31 kg/m  No LMP recorded.  General:   Alert,  Well-developed, well-nourished, pleasant and cooperative in NAD Head:  Normocephalic and atraumatic. Eyes:  Sclera clear, no icterus.   Conjunctiva pink. Ears:  Normal auditory acuity. Nose:  No deformity, discharge, or lesions. Mouth:  No deformity or lesions,oropharynx pink & moist. Neck:  Supple; no masses or thyromegaly. Lungs:  Respirations even and unlabored.  Clear throughout to auscultation.   No wheezes, crackles, or rhonchi. No acute distress. Heart:  Regular rate and rhythm; no murmurs, clicks, rubs, or gallops. Abdomen:  Normal bowel sounds. Soft, non-tender and non-distended without masses, hepatosplenomegaly or hernias noted.  No guarding or rebound tenderness.   Rectal: normal perianal exam, a small skin tag, rectal exam was normal, brown stool on the glove Msk:  Symmetrical without gross deformities. Good, equal movement & strength bilaterally. Pulses:  Normal pulses noted. Extremities:  No clubbing or edema.  No cyanosis. Neurologic:  Alert and oriented x3;  grossly normal neurologically. Skin:  Intact without significant lesions or rashes. No jaundice. Lymph Nodes:  No significant cervical adenopathy. Psych:  Alert and cooperative. Normal mood and affect.  Imaging Studies: reviewed  Assessment and Plan:   Hayley Hunt is a 27 y.o. Caucasian female with no significant past medical history, chronic history of GI symptoms including nonbloody diarrhea, abdominal bloating and cramps without alarm features. She probably has diarrhea predominant irritable bowel syndrome. She noticed isolated episode of inflamed external hemorrhoid which has now subsided.   IBS-D: Trial of IB  guard Trial of lactobacillus probiotics  External hemorrhoid: currently asymptomatic Watch for the symptoms   Follow up in 2-3 months if symptoms recur   Cephas Darby, MD

## 2017-12-20 ENCOUNTER — Encounter: Payer: Self-pay | Admitting: Gastroenterology

## 2017-12-24 DIAGNOSIS — N939 Abnormal uterine and vaginal bleeding, unspecified: Secondary | ICD-10-CM | POA: Diagnosis not present

## 2018-01-30 ENCOUNTER — Telehealth: Payer: Self-pay | Admitting: Gastroenterology

## 2018-01-30 NOTE — Telephone Encounter (Signed)
Patient called stating she has blood in her stool should she do a stool specimen or make an appointment. Please advise.

## 2018-01-31 ENCOUNTER — Other Ambulatory Visit: Payer: Self-pay

## 2018-01-31 DIAGNOSIS — K921 Melena: Secondary | ICD-10-CM

## 2018-01-31 DIAGNOSIS — R197 Diarrhea, unspecified: Secondary | ICD-10-CM

## 2018-01-31 MED ORDER — HYDROCORTISONE 2.5 % RE CREA: 1.0000 "application " | TOPICAL_CREAM | Freq: Two times a day (BID) | RECTAL | 0 refills | Status: AC

## 2018-01-31 NOTE — Telephone Encounter (Signed)
Spoke with patient and she stated she is having both bloody diarrhea and some formed stools, so I ordered stool studies to rule out C. Diff and GI panel and also sent anusol cream to pharmacy, pt verbalized understanding

## 2018-02-03 ENCOUNTER — Other Ambulatory Visit
Admission: RE | Admit: 2018-02-03 | Discharge: 2018-02-03 | Disposition: A | Discharge status: Home / Self Care | Payer: 59 | Source: Ambulatory Visit | Attending: Gastroenterology | Admitting: Gastroenterology

## 2018-02-03 DIAGNOSIS — R197 Diarrhea, unspecified: Secondary | ICD-10-CM | POA: Insufficient documentation

## 2018-02-03 DIAGNOSIS — K921 Melena: Secondary | ICD-10-CM | POA: Insufficient documentation

## 2018-02-03 LAB — GASTROINTESTINAL PANEL BY PCR, STOOL (REPLACES STOOL CULTURE)

## 2018-02-03 LAB — C DIFFICILE QUICK SCREEN W PCR REFLEX
C Diff antigen: NEGATIVE
C Diff interpretation: NOT DETECTED
C Diff toxin: NEGATIVE

## 2018-03-04 DIAGNOSIS — H6062 Unspecified chronic otitis externa, left ear: Secondary | ICD-10-CM | POA: Diagnosis not present

## 2018-03-04 DIAGNOSIS — R42 Dizziness and giddiness: Secondary | ICD-10-CM | POA: Diagnosis not present

## 2018-03-04 DIAGNOSIS — H93293 Other abnormal auditory perceptions, bilateral: Secondary | ICD-10-CM | POA: Diagnosis not present

## 2018-03-06 ENCOUNTER — Encounter: Payer: Self-pay | Admitting: Gastroenterology

## 2018-03-06 ENCOUNTER — Other Ambulatory Visit: Payer: Self-pay

## 2018-03-06 ENCOUNTER — Ambulatory Visit (INDEPENDENT_AMBULATORY_CARE_PROVIDER_SITE_OTHER): Payer: 59 | Admitting: Gastroenterology

## 2018-03-06 VITALS — BP 108/73 | HR 89 | Resp 17 | Ht 60.0 in | Wt 134.6 lb

## 2018-03-06 DIAGNOSIS — K625 Hemorrhage of anus and rectum: Secondary | ICD-10-CM

## 2018-03-06 DIAGNOSIS — K580 Irritable bowel syndrome with diarrhea: Secondary | ICD-10-CM | POA: Diagnosis not present

## 2018-03-06 NOTE — Progress Notes (Signed)
Cephas Darby, MD [address]  [place]  [place], [place] [address]  Main: [phone]  Fax: [phone]    Gastroenterology Consultation  Referring Provider:     Arnetha Courser, MD Primary Care Physician:  Arnetha Courser, MD Primary Gastroenterologist:  Dr. Cephas Darby Reason for Consultation:     IBS, symptomatic hemorrhoids        HPI:   Hayley Hunt is a 27 y.o. female referred by Dr. Sanda Klein, Satira Anis, MD  for consultation & management of IBS. She reports several years history of episodes of increased bowel frequency, nonbloody associated with significant bloating and cramps primarily triggered after eating certain foods like wheat, dairy, soy, eggs etc. She was tested for celiac disease and was negative in 2017. She reports that she could not tolerate milk even as a child. She was seen by allergist, dnd underwent extensive allergy testing and she did not have any known allergies to foods  About 2 months ago she had wisdom teeth extraction and was given metronidazole and amoxicillin, she developed rash. During that time, she had flareup of IBS symptoms and also felt a piece of tissue outside the anus, associated with blood on wiping, irritation and itching. She used preparation H after seeing Dr. Sanda Klein. Symptoms have subsided since then. She thinks she still has a small piece of tissue that she feels and wanted to make sure that it's not hemorrhoid She is no longer experiencing rectal bleeding.   Follow-up is 03/06/2018 Patient reports that she had one episode of painless rectal bleeding that lasted for a day and subsided.  IBgard did not help.  She continues to have symptoms of IBS.  She knows that if she can avoid certain food triggers, her symptoms are better.  But she has not been doing it.  She denies weight loss.  NSAIDs: None  Antiplts/Anticoagulants/Anti thrombotics: none  GI Procedures: none No GI surgeries in the past No family history of GI malignancy,  celiac disease, inflammatory bowel disease  Past Medical History:  Diagnosis Date  . Allergic asthma without acute exacerbation or status asthmaticus   . Asthma, mild 09/13/2015  . Common ichthyosis   . Eczema   . History of abnormal cervical Pap smear   . Ovarian cyst     No past surgical history on file.  Current Outpatient Medications:  .  albuterol (VENTOLIN HFA) 108 (90 BASE) MCG/ACT inhaler, Inhale 1-2 puffs into the lungs every 6 (six) hours as needed., Disp: , Rfl:  .  Azelastine-Fluticasone (DYMISTA) 137-50 MCG/ACT SUSP, Place 2 sprays into the nose daily., Disp: , Rfl:  .  loratadine (CLARITIN) 10 MG tablet, Take 1 tablet (10 mg total) by mouth daily as needed for allergies., Disp: 30 tablet, Rfl: 11 .  LORazepam (ATIVAN) 0.5 MG tablet, Take 0.5-1 tablets (0.25-0.5 mg total) by mouth 2 (two) times daily as needed for anxiety., Disp: 10 tablet, Rfl: 0 .  ARNUITY ELLIPTA 100 MCG/ACT AEPB, Inhale 1 puff into the lungs daily., Disp: , Rfl: 0 .  ipratropium-albuterol (DUONEB) 0.5-2.5 (3) MG/3ML SOLN, Take 3 mLs by nebulization every 6 (six) hours as needed., Disp: , Rfl:  .  mometasone (ELOCON) 0.1 % lotion, , Disp: , Rfl:  .  montelukast (SINGULAIR) 10 MG tablet, Take 10 mg every evening by mouth., Disp: , Rfl: 1 .  norethindrone (MICRONOR,CAMILA,ERRIN) 0.35 MG tablet, Take 1 tablet (0.35 mg total) by mouth daily. (Patient not taking: Reported on 03/06/2018), Disp:  1 Package, Rfl: 11   Family History  Problem Relation Age of Onset  . Asthma Mother   . Hypothyroidism Mother   . Osteoporosis Maternal Grandmother   . Heart attack Maternal Grandfather   . Heart disease Maternal Grandfather   . Cancer Paternal Grandfather        lung  . Breast cancer Neg Hx   . Ovarian cancer Neg Hx      Social History   Tobacco Use  . Smoking status: Never Smoker  . Smokeless tobacco: Never Used  Substance Use Topics  . Alcohol use: No    Alcohol/week: 0.0 standard drinks  . Drug use:  No    Allergies as of 03/06/2018 - Review Complete 03/06/2018  Allergen Reaction Noted  . Amoxicillin Hives and Rash 09/28/2017  . Clindamycin/lincomycin Rash 10/21/2017  . Metronidazole Itching, Other (See Comments), and Rash 09/28/2017  . Penicillins Anaphylaxis and Hives 11/22/2014  . Latex  11/22/2014    Review of Systems:    All systems reviewed and negative except where noted in HPI.   Physical Exam:  BP 108/73 (BP Location: Left Arm, Patient Position: Sitting, Cuff Size: Normal)   Pulse 89   Resp 17   Ht 5' (1.524 m)   Wt 134 lb 9.6 oz (61.1 kg)   BMI 26.29 kg/m  No LMP recorded.  General:   Alert,  Well-developed, well-nourished, pleasant and cooperative in NAD Head:  Normocephalic and atraumatic. Eyes:  Sclera clear, no icterus.   Conjunctiva pink. Ears:  Normal auditory acuity. Nose:  No deformity, discharge, or lesions. Mouth:  No deformity or lesions,oropharynx pink & moist. Neck:  Supple; no masses or thyromegaly. Lungs:  Respirations even and unlabored.  Clear throughout to auscultation.   No wheezes, crackles, or rhonchi. No acute distress. Heart:  Regular rate and rhythm; no murmurs, clicks, rubs, or gallops. Abdomen:  Normal bowel sounds. Soft, non-tender and non-distended without masses, hepatosplenomegaly or hernias noted.  No guarding or rebound tenderness.   Rectal: normal perianal exam, a small skin tag, rectal exam was normal, brown stool on the glove Msk:  Symmetrical without gross deformities. Good, equal movement & strength bilaterally. Pulses:  Normal pulses noted. Extremities:  No clubbing or edema.  No cyanosis. Neurologic:  Alert and oriented x3;  grossly normal neurologically. Skin:  Intact without significant lesions or rashes. No jaundice. Lymph Nodes:  No significant cervical adenopathy. Psych:  Alert and cooperative. Normal mood and affect.  Imaging Studies: reviewed  Assessment and Plan:   Hayley Hunt is a 27 y.o. Caucasian  female with no significant past medical history, chronic history of GI symptoms including nonbloody diarrhea, abdominal bloating and cramps without alarm features. She probably has diarrhea predominant irritable bowel syndrome.  Intermittent rectal bleeding.  Celiac serologies were negative  IBS-D: H. pylori breath test, treat if positive Trial of VS L #3 Avoid food triggers  Rectal bleeding Flexible sigmoidoscopy  Follow up in 3 months    Cephas Darby, MD

## 2018-03-07 LAB — H. PYLORI BREATH TEST: H pylori Breath Test: NEGATIVE

## 2018-03-10 ENCOUNTER — Other Ambulatory Visit: Payer: Self-pay | Admitting: Otolaryngology

## 2018-03-10 DIAGNOSIS — R42 Dizziness and giddiness: Secondary | ICD-10-CM

## 2018-03-14 DIAGNOSIS — R42 Dizziness and giddiness: Secondary | ICD-10-CM | POA: Diagnosis not present

## 2018-03-19 ENCOUNTER — Encounter: Payer: Self-pay | Admitting: Emergency Medicine

## 2018-03-24 ENCOUNTER — Encounter
Admission: RE | Disposition: A | Discharge status: Home / Self Care | Payer: Self-pay | Source: Ambulatory Visit | Attending: Gastroenterology

## 2018-03-24 ENCOUNTER — Other Ambulatory Visit: Payer: Self-pay

## 2018-03-24 ENCOUNTER — Ambulatory Visit: Payer: 59 | Admitting: Certified Registered Nurse Anesthetist

## 2018-03-24 ENCOUNTER — Ambulatory Visit
Admission: RE | Admit: 2018-03-24 | Discharge: 2018-03-24 | Disposition: A | Discharge status: Home / Self Care | Payer: 59 | Source: Ambulatory Visit | Attending: Gastroenterology | Admitting: Gastroenterology

## 2018-03-24 ENCOUNTER — Encounter: Payer: Self-pay | Admitting: *Deleted

## 2018-03-24 DIAGNOSIS — K625 Hemorrhage of anus and rectum: Secondary | ICD-10-CM

## 2018-03-24 DIAGNOSIS — K649 Unspecified hemorrhoids: Secondary | ICD-10-CM | POA: Diagnosis not present

## 2018-03-24 DIAGNOSIS — Z91040 Latex allergy status: Secondary | ICD-10-CM | POA: Diagnosis not present

## 2018-03-24 DIAGNOSIS — Z880 Allergy status to penicillin: Secondary | ICD-10-CM | POA: Diagnosis not present

## 2018-03-24 DIAGNOSIS — J45909 Unspecified asthma, uncomplicated: Secondary | ICD-10-CM | POA: Insufficient documentation

## 2018-03-24 HISTORY — PX: FLEXIBLE SIGMOIDOSCOPY: SHX5431

## 2018-03-24 LAB — POCT PREGNANCY, URINE: Preg Test, Ur: NEGATIVE

## 2018-03-24 SURGERY — SIGMOIDOSCOPY, FLEXIBLE
Anesthesia: General

## 2018-03-24 MED ORDER — PROPOFOL 10 MG/ML IV BOLUS
INTRAVENOUS | Status: DC | PRN
  Administered 2018-03-24: 60 mg via INTRAVENOUS
  Administered 2018-03-24: 40 mg via INTRAVENOUS

## 2018-03-24 MED ORDER — SODIUM CHLORIDE 0.9 % IV SOLN
INTRAVENOUS | Status: DC
  Administered 2018-03-24: 11:00:00 via INTRAVENOUS

## 2018-03-24 MED ORDER — MIDAZOLAM HCL 2 MG/2ML IJ SOLN
INTRAMUSCULAR | Status: AC
  Filled 2018-03-24: qty 2

## 2018-03-24 MED ORDER — MIDAZOLAM HCL 2 MG/2ML IJ SOLN
INTRAMUSCULAR | Status: DC | PRN
  Administered 2018-03-24: 2 mg via INTRAVENOUS

## 2018-03-24 MED ORDER — PROPOFOL 500 MG/50ML IV EMUL
INTRAVENOUS | Status: DC | PRN
  Administered 2018-03-24: 170 ug/kg/min via INTRAVENOUS

## 2018-03-24 NOTE — Anesthesia Post-op Follow-up Note (Signed)
Anesthesia QCDR form completed.        

## 2018-03-24 NOTE — Op Note (Signed)
[facility] Patient Name: Hayley Hunt Procedure Date: 03/24/2018 11:01 AM MRN: [id number] Account #: [id number] Date of Birth: 01/08/1991 Admit Type: Outpatient Age: 27 Room: [facility] ENDO ROOM 2 Gender: Female Note Status: Finalized Procedure:            Flexible Sigmoidoscopy Indications:          Rectal hemorrhage Providers:            Lin Landsman MD, MD Referring MD:         Arnetha Courser (Referring MD) Medicines:            Monitored Anesthesia Care Complications:        No immediate complications. Estimated blood loss: None. Procedure:            Pre-Anesthesia Assessment:                       - Prior to the procedure, a History and Physical was                        performed, and patient medications and allergies were                        reviewed. The patient is competent. The risks and                        benefits of the procedure and the sedation options and                        risks were discussed with the patient. All questions                        were answered and informed consent was obtained.                        Patient identification and proposed procedure were                        verified by the physician, the nurse, the                        anesthesiologist, the anesthetist and the technician in                        the pre-procedure area in the procedure room in the                        endoscopy suite. Mental Status Examination: alert and                        oriented. Airway Examination: normal oropharyngeal                        airway and neck mobility. Respiratory Examination:                        clear to auscultation. CV Examination: normal.                        Prophylactic Antibiotics: The patient does not require  prophylactic antibiotics. Prior Anticoagulants: The                        patient has taken no previous anticoagulant or                         antiplatelet agents. ASA Grade Assessment: II - A                        patient with mild systemic disease. After reviewing the                        risks and benefits, the patient was deemed in                        satisfactory condition to undergo the procedure. The                        anesthesia plan was to use monitored anesthesia care                        (MAC). Immediately prior to administration of                        medications, the patient was re-assessed for adequacy                        to receive sedatives. The heart rate, respiratory rate,                        oxygen saturations, blood pressure, adequacy of                        pulmonary ventilation, and response to care were                        monitored throughout the procedure. The physical status                        of the patient was re-assessed after the procedure.                       After obtaining informed consent, the scope was passed                        under direct vision. The Colonoscope was introduced                        through the anus and advanced to the the left                        transverse colon. The flexible sigmoidoscopy was                        accomplished without difficulty. The patient tolerated                        the procedure well. The quality of the bowel  preparation was fair. Findings:      The perianal and digital rectal examinations were normal. Pertinent       negatives include normal sphincter tone and no palpable rectal lesions.      The entire examined colon appeared normal. Normal mucosa. Scope was       advanced to cecum as prep was fair. No tumors or large polyps seen Impression:           - Preparation of the colon was fair.                       - The entire examined colon is normal.                       - No specimens collected.                       - Intermittent rectal bleding secondary to  hemorrhoids Recommendation:       - Discharge patient to home (with parent).                       - Resume previous diet today.                       - Return to my office as previously scheduled. Dr. Ulyess Mort Lin Landsman MD, MD 03/24/2018 11:39:00 AM This report has been signed electronically. Number of Addenda: 0 Note Initiated On: 03/24/2018 11:01 AM Scope Withdrawal Time: 0 hours 2 minutes 35 seconds  Total Procedure Duration: 0 hours 5 minutes 5 seconds       [facility]

## 2018-03-24 NOTE — Progress Notes (Signed)
Physician was able to reach cecum during procedure.

## 2018-03-24 NOTE — Anesthesia Preprocedure Evaluation (Signed)
Anesthesia Evaluation  Patient identified by MRN, date of birth, ID band Patient awake    Reviewed: Allergy & Precautions, H&P , NPO status , Patient's Chart, lab work & pertinent test results  Airway Mallampati: I  TM Distance: >3 FB     Dental  (+) Teeth Intact   Pulmonary asthma ,           Cardiovascular negative cardio ROS       Neuro/Psych negative neurological ROS  negative psych ROS   GI/Hepatic negative GI ROS, Neg liver ROS,   Endo/Other  negative endocrine ROS  Renal/GU negative Renal ROS  negative genitourinary   Musculoskeletal   Abdominal   Peds  Hematology negative hematology ROS (+)   Anesthesia Other Findings Past Medical History: No date: Allergic asthma without acute exacerbation or status  asthmaticus 09/13/2015: Asthma, mild No date: Common ichthyosis No date: Eczema No date: History of abnormal cervical Pap smear No date: Ovarian cyst  Past Surgical History: No date: WISDOM TOOTH EXTRACTION  BMI    Body Mass Index:  25.39 kg/m      Reproductive/Obstetrics negative OB ROS                             Anesthesia Physical Anesthesia Plan  ASA: II  Anesthesia Plan: General   Post-op Pain Management:    Induction:   PONV Risk Score and Plan: Propofol infusion and TIVA  Airway Management Planned: Natural Airway and Nasal Cannula  Additional Equipment:   Intra-op Plan:   Post-operative Plan:   Informed Consent: I have reviewed the patients History and Physical, chart, labs and discussed the procedure including the risks, benefits and alternatives for the proposed anesthesia with the patient or authorized representative who has indicated his/her understanding and acceptance.   Dental Advisory Given  Plan Discussed with: Anesthesiologist, CRNA and Surgeon  Anesthesia Plan Comments:         Anesthesia Quick Evaluation

## 2018-03-24 NOTE — Anesthesia Procedure Notes (Signed)
Performed by: Vicktoria Muckey, CRNA Pre-anesthesia Checklist: Patient identified, Emergency Drugs available, Suction available, Patient being monitored and Timeout performed Patient Re-evaluated:Patient Re-evaluated prior to induction Induction Type: IV induction       

## 2018-03-24 NOTE — H&P (Signed)
Hayley Darby, MD [address]  [place]  [place], [place] [address]  Main: [phone]  Fax: [phone] Pager: [phone]  Primary Care Physician:  Arnetha Courser, MD Primary Gastroenterologist:  Dr. Cephas Hunt  Pre-Procedure History & Physical: HPI:  Hayley Hunt is a 27 y.o. female is here for a flexible sigmoidoscopy.   Past Medical History:  Diagnosis Date  . Allergic asthma without acute exacerbation or status asthmaticus   . Asthma, mild 09/13/2015  . Common ichthyosis   . Eczema   . History of abnormal cervical Pap smear   . Ovarian cyst     Past Surgical History:  Procedure Laterality Date  . WISDOM TOOTH EXTRACTION      Prior to Admission medications   Medication Sig Start Date End Date Taking? Authorizing Provider  albuterol (VENTOLIN HFA) 108 (90 BASE) MCG/ACT inhaler Inhale 1-2 puffs into the lungs every 6 (six) hours as needed. 05/27/14   [provider]  ARNUITY ELLIPTA 100 MCG/ACT AEPB Inhale 1 puff into the lungs daily. 08/26/16   [provider]  Azelastine-Fluticasone (DYMISTA) 137-50 MCG/ACT SUSP Place 2 sprays into the nose daily.    [provider]  ipratropium-albuterol (DUONEB) 0.5-2.5 (3) MG/3ML SOLN Take 3 mLs by nebulization every 6 (six) hours as needed. 01/24/17   Arnetha Courser, MD  loratadine (CLARITIN) 10 MG tablet Take 1 tablet (10 mg total) by mouth daily as needed for allergies. 01/24/17   Lada, Satira Anis, MD  LORazepam (ATIVAN) 0.5 MG tablet Take 0.5-1 tablets (0.25-0.5 mg total) by mouth 2 (two) times daily as needed for anxiety. 05/11/17   Arnetha Courser, MD  mometasone (ELOCON) 0.1 % lotion  03/04/18   [provider]  montelukast (SINGULAIR) 10 MG tablet Take 10 mg every evening by mouth. 02/21/17   [provider]  norethindrone (MICRONOR,CAMILA,ERRIN) 0.35 MG tablet Take 1 tablet (0.35 mg total) by mouth daily. Patient not taking: Reported on 03/06/2018 06/26/17   Arnetha Courser,  MD    Allergies as of 03/07/2018 - Review Complete 03/06/2018  Allergen Reaction Noted  . Amoxicillin Hives and Rash 09/28/2017  . Clindamycin/lincomycin Rash 10/21/2017  . Metronidazole Itching, Other (See Comments), and Rash 09/28/2017  . Penicillins Anaphylaxis and Hives 11/22/2014  . Latex  11/22/2014    Family History  Problem Relation Age of Onset  . Asthma Mother   . Hypothyroidism Mother   . Osteoporosis Maternal Grandmother   . Heart attack Maternal Grandfather   . Heart disease Maternal Grandfather   . Cancer Paternal Grandfather        lung  . Breast cancer Neg Hx   . Ovarian cancer Neg Hx     Social History   Socioeconomic History  . Marital status: Single    Spouse name: Not on file  . Number of children: Not on file  . Years of education: Not on file  . Highest education level: Not on file  Occupational History  . Not on file  Social Needs  . Financial resource strain: Not on file  . Food insecurity:    Worry: Not on file    Inability: Not on file  . Transportation needs:    Medical: Not on file    Non-medical: Not on file  Tobacco Use  . Smoking status: Never Smoker  . Smokeless tobacco: Never Used  Substance and Sexual Activity  . Alcohol use: No    Alcohol/week: 0.0 standard drinks  .  Drug use: No  . Sexual activity: Yes    Partners: Male    Birth control/protection: Condom  Lifestyle  . Physical activity:    Days per week: Not on file    Minutes per session: Not on file  . Stress: Not on file  Relationships  . Social connections:    Talks on phone: Not on file    Gets together: Not on file    Attends religious service: Not on file    Active member of club or organization: Not on file    Attends meetings of clubs or organizations: Not on file    Relationship status: Not on file  . Intimate partner violence:    Fear of current or ex partner: Not on file    Emotionally abused: Not on file    Physically abused: Not on file    Forced  sexual activity: Not on file  Other Topics Concern  . Not on file  Social History Narrative  . Not on file    Review of Systems: See HPI, otherwise negative ROS  Physical Exam: BP (!) 78/55   Pulse 81   Temp (!) 97 F (36.1 C) (Tympanic)   Resp 18   Ht 5' (1.524 m)   Wt 130 lb (59 kg)   LMP 02/17/2018 Comment: neg preg. test on 03/24/18  SpO2 99%   BMI 25.39 kg/m  General:   Alert,  pleasant and cooperative in NAD Head:  Normocephalic and atraumatic. Neck:  Supple; no masses or thyromegaly. Lungs:  Clear throughout to auscultation.    Heart:  Regular rate and rhythm. Abdomen:  Soft, nontender and nondistended. Normal bowel sounds, without guarding, and without rebound.   Neurologic:  Alert and  oriented x4;  grossly normal neurologically.  Impression/Plan: Hayley Hunt is here for an flexible sigmoidoscopy to be performed for rectal bleeding  Risks, benefits, limitations, and alternatives regarding  flexible sigmoidoscopy have been reviewed with the patient.  Questions have been answered.  All parties agreeable.   Sherri Sear, MD  03/24/2018, 3:34 PM

## 2018-03-24 NOTE — Transfer of Care (Signed)
Immediate Anesthesia Transfer of Care Note  Patient: Hayley Hunt  Procedure(s) Performed: FLEXIBLE SIGMOIDOSCOPY  Patient Location: PACU  Anesthesia Type:General  Level of Consciousness: drowsy  Airway & Oxygen Therapy: Patient Spontanous Breathing and Patient connected to nasal cannula oxygen  Post-op Assessment: Report given to RN and Post -op Vital signs reviewed and stable  Post vital signs: Reviewed and stable  Last Vitals:  Vitals Value Taken Time  BP 78/55 03/24/2018 11:40 AM  Temp 36.1 C 03/24/2018 11:40 AM  Pulse 77 03/24/2018 11:40 AM  Resp 22 03/24/2018 11:40 AM  SpO2 99 % 03/24/2018 11:40 AM  Vitals shown include unvalidated device data.  Last Pain:  Vitals:   03/24/18 1140  TempSrc: Tympanic  PainSc: 0-No pain         Complications: No apparent anesthesia complications

## 2018-03-25 DIAGNOSIS — K219 Gastro-esophageal reflux disease without esophagitis: Secondary | ICD-10-CM | POA: Diagnosis not present

## 2018-03-25 DIAGNOSIS — R42 Dizziness and giddiness: Secondary | ICD-10-CM | POA: Diagnosis not present

## 2018-03-25 NOTE — Anesthesia Postprocedure Evaluation (Signed)
Anesthesia Post Note  Patient: Hayley Hunt  Procedure(s) Performed: [place]  Patient location during evaluation: PACU Anesthesia Type: General Level of consciousness: awake and alert Pain management: pain level controlled Vital Signs Assessment: post-procedure vital signs reviewed and stable Respiratory status: spontaneous breathing, nonlabored ventilation, respiratory function stable and patient connected to nasal cannula oxygen Cardiovascular status: blood pressure returned to baseline and stable Postop Assessment: no apparent nausea or vomiting Anesthetic complications: no     Last Vitals:  Vitals:   03/24/18 1047 03/24/18 1140  BP: 105/66 (!) 78/55  Pulse: 93 81  Resp: 16 18  Temp: (!) 35.9 C (!) 36.1 C  SpO2: 100% 99%    Last Pain:  Vitals:   03/24/18 1210  TempSrc:   PainSc: 0-No pain                 Durenda Hurt

## 2018-03-26 ENCOUNTER — Encounter: Payer: Self-pay | Admitting: Gastroenterology

## 2018-03-28 ENCOUNTER — Ambulatory Visit
Admission: RE | Admit: 2018-03-28 | Discharge: 2018-03-28 | Disposition: A | Discharge status: Home / Self Care | Payer: 59 | Source: Ambulatory Visit | Attending: Otolaryngology | Admitting: Otolaryngology

## 2018-03-28 DIAGNOSIS — R42 Dizziness and giddiness: Secondary | ICD-10-CM | POA: Insufficient documentation

## 2018-03-28 MED ORDER — GADOBUTROL 1 MMOL/ML IV SOLN
6.0000 mL | Freq: Once | INTRAVENOUS | Status: AC | PRN
  Administered 2018-03-28: 6 mL via INTRAVENOUS

## 2018-04-23 NOTE — L&D Delivery Note (Signed)
Delivery Note  SVD viable female Apgars 9,9 over 3rd degree ML lac.  Placenta delivered spontaneously intact with 3VC. Repair with 2-0, 3-0 and O Chromic with good support and hemostasis noted.  R/V exam confirms.  PH art was sent.   Mother and baby to couplet care and are doing well.  EBL 250cc  Louretta Shorten, MD

## 2018-04-25 DIAGNOSIS — D485 Neoplasm of uncertain behavior of skin: Secondary | ICD-10-CM | POA: Diagnosis not present

## 2018-04-25 DIAGNOSIS — D225 Melanocytic nevi of trunk: Secondary | ICD-10-CM | POA: Diagnosis not present

## 2018-05-01 ENCOUNTER — Encounter: Payer: Self-pay | Admitting: Family Medicine

## 2018-05-01 ENCOUNTER — Ambulatory Visit (INDEPENDENT_AMBULATORY_CARE_PROVIDER_SITE_OTHER): Payer: 59 | Admitting: Family Medicine

## 2018-05-01 VITALS — BP 108/64 | HR 87 | Temp 98.0°F | Ht 60.5 in | Wt 128.5 lb

## 2018-05-01 DIAGNOSIS — E559 Vitamin D deficiency, unspecified: Secondary | ICD-10-CM | POA: Insufficient documentation

## 2018-05-01 DIAGNOSIS — M545 Low back pain: Secondary | ICD-10-CM

## 2018-05-01 DIAGNOSIS — G8929 Other chronic pain: Secondary | ICD-10-CM

## 2018-05-01 DIAGNOSIS — Z23 Encounter for immunization: Secondary | ICD-10-CM | POA: Diagnosis not present

## 2018-05-01 DIAGNOSIS — Z0000 Encounter for general adult medical examination without abnormal findings: Secondary | ICD-10-CM

## 2018-05-01 NOTE — Progress Notes (Signed)
BP 108/64   Pulse 87   Temp 98 F (36.7 C) (Oral)   Ht 5' 0.5" (1.537 m)   Wt 128 lb 8 oz (58.3 kg)   LMP 03/28/2018   SpO2 99%   BMI 24.68 kg/m    Subjective:    Patient ID: Hayley Hunt, female    DOB: [birth year], 28 y.o.   MRN: [id number]  HPI: Hayley Hunt is a 28 y.o. female  Chief Complaint  Patient presents with  . Annual Exam    HPI  She had hemorrhoids and one popped, good amount of blood; checked out by Dr. Marius Ditch; had colonoscopy She had an MRI of her brain, ordered by ENT: ears were getting stopped up; does get migraines, he checked her out; thinks migraines; he wanted her to see neurologist, he referred her She is having back pain; feels like muscle; middle back L1 and L2; feels like tired muscle; no blood in the urine; no burning with urination; no loss of control of B/B; no weakness in legs; patient has been told her back is out of alignment; she has had xrays before; she works out and does not stretch She has really dry skin She would like a vitamin D level checked; we reviewed her previous vit D level and it was low  USPSTF grade A and B recommendations Depression:  Depression screen [facility] 2/9 05/01/2018 10/21/2017 06/26/2017 04/30/2017 02/27/2017  Decreased Interest 0 0 0 0 0  Down, Depressed, Hopeless 0 0 0 0 0  PHQ - 2 Score 0 0 0 0 0  Altered sleeping 0 - - - -  Tired, decreased energy 0 - - - -  Change in appetite 0 - - - -  Feeling bad or failure about yourself  0 - - - -  Trouble concentrating 0 - - - -  Moving slowly or fidgety/restless 0 - - - -  Suicidal thoughts 0 - - - -  PHQ-9 Score 0 - - - -  Difficult doing work/chores Not difficult at all - - - -   Hypertension: BP Readings from Last 3 Encounters:  05/01/18 108/64  03/24/18 (!) 78/55  03/06/18 108/73   Obesity: Wt Readings from Last 3 Encounters:  05/01/18 128 lb 8 oz (58.3 kg)  03/24/18 130 lb (59 kg)  03/06/18 134 lb 9.6 oz (61.1 kg)   BMI Readings from Last 3 Encounters:    05/01/18 24.68 kg/m  03/24/18 25.39 kg/m  03/06/18 26.29 kg/m     Skin cancer: just saw dermatologist, two moles removed and path report pending, just went last Friday Lung cancer:  nonsmoker Breast cancer: no lumps Colorectal cancer: no fam hx; already had colonoscopy Cervical cancer screening: UTD BRCA gene screening: family hx of breast and/or ovarian cancer and/or metastatic prostate cancer? no HIV, hep B, hep C: not interested STD testing and prevention (chl/gon/syphilis): not interested Intimate partner violence: no abuse Contraception: sexually active, no contraception, seeing what happens; would not be a problem if she got pregnant she says; used to be on OCP but has migraines; the other birth control caused bleeding, then she went back to GYN and stopped birth control Osteoporosis: grandmother has it; steroids as a child, multiple round Fall prevention/vitamin D: discussed Immunizations: Gardisil series UTD per patient; not sure about hep B series Diet: good eater Exercise: weight lifting and cardio Alcohol:    Office Visit from 05/01/2018 in [facility]  AUDIT-C Score  0  Tobacco use: no AAA: n/a Aspirin: n/a Glucose:  Glucose  Date Value Ref Range Status  06/28/2017 80 65 - 99 mg/dL Final  05/10/2017 118 (H) 65 - 99 mg/dL Final  04/30/2017 88 65 - 99 mg/dL Final  08/27/2011 91 65 - 99 mg/dL Final   Glucose, Bld  Date Value Ref Range Status  09/28/2017 114 (H) 65 - 99 mg/dL Final   Lipids:  Lab Results  Component Value Date   CHOL 193 04/30/2017   CHOL 199 04/27/2016   Lab Results  Component Value Date   HDL 42 04/30/2017   HDL 50 04/27/2016   Lab Results  Component Value Date   LDLCALC 126 (H) 04/30/2017   LDLCALC 133 (H) 04/27/2016   Lab Results  Component Value Date   TRIG 126 04/30/2017   TRIG 79 04/27/2016   Lab Results  Component Value Date   CHOLHDL 4.6 (H) 04/30/2017   CHOLHDL 4.0 04/27/2016   No results  found for: LDLDIRECT   Depression screen [facility] 2/9 05/01/2018 10/21/2017 06/26/2017 04/30/2017 02/27/2017  Decreased Interest 0 0 0 0 0  Down, Depressed, Hopeless 0 0 0 0 0  PHQ - 2 Score 0 0 0 0 0  Altered sleeping 0 - - - -  Tired, decreased energy 0 - - - -  Change in appetite 0 - - - -  Feeling bad or failure about yourself  0 - - - -  Trouble concentrating 0 - - - -  Moving slowly or fidgety/restless 0 - - - -  Suicidal thoughts 0 - - - -  PHQ-9 Score 0 - - - -  Difficult doing work/chores Not difficult at all - - - -   Fall Risk  05/01/2018 10/21/2017 06/26/2017 04/30/2017 02/27/2017  Falls in the past year? 0 No No No No  Number falls in past yr: 0 - - - -  Injury with Fall? 0 - - - -    Relevant past medical, surgical, family and social history reviewed Past Medical History:  Diagnosis Date  . Allergic asthma without acute exacerbation or status asthmaticus   . Asthma, mild 09/13/2015  . Common ichthyosis   . Eczema   . History of abnormal cervical Pap smear   . Ovarian cyst    Past Surgical History:  Procedure Laterality Date  . FLEXIBLE SIGMOIDOSCOPY  03/24/2018   Procedure: FLEXIBLE SIGMOIDOSCOPY;  Surgeon: Lin Landsman, MD;  Location: [facility] ENDOSCOPY;  Service: Gastroenterology;;  . WISDOM TOOTH EXTRACTION     Family History  Problem Relation Age of Onset  . Asthma Mother   . Hypothyroidism Mother   . Osteoporosis Maternal Grandmother   . Heart attack Maternal Grandfather   . Heart disease Maternal Grandfather   . Cancer Paternal Grandfather        lung  . Breast cancer Neg Hx   . Ovarian cancer Neg Hx    Social History   Tobacco Use  . Smoking status: Never Smoker  . Smokeless tobacco: Never Used  Substance Use Topics  . Alcohol use: No    Alcohol/week: 0.0 standard drinks  . Drug use: No     [facility] Visit from 05/01/2018 in [facility]  AUDIT-C Score  0      Interim medical history since last visit reviewed. Allergies and medications  reviewed  Review of Systems  Constitutional: Negative for unexpected weight change.  HENT: Negative for nosebleeds.   Respiratory: Negative for wheezing.  Has asthma; gets bronchitis if she had a cold; has a little cough remaining, not that bad  Cardiovascular: Negative for chest pain.  Gastrointestinal: Negative for abdominal pain and blood in stool.  Endocrine: Negative for polydipsia and polyuria.  Genitourinary: Negative for hematuria.  Musculoskeletal: Negative for joint swelling.  Skin:       Two moles removed last week; healing okay  Neurological: Negative for tremors.  Hematological: Negative for adenopathy. Bruises/bleeds easily (nothing new).   Per HPI unless specifically indicated above     Objective:    BP 108/64   Pulse 87   Temp 98 F (36.7 C) (Oral)   Ht 5' 0.5" (1.537 m)   Wt 128 lb 8 oz (58.3 kg)   LMP 03/28/2018   SpO2 99%   BMI 24.68 kg/m   Wt Readings from Last 3 Encounters:  05/01/18 128 lb 8 oz (58.3 kg)  03/24/18 130 lb (59 kg)  03/06/18 134 lb 9.6 oz (61.1 kg)    Physical Exam Constitutional:      Appearance: Normal appearance. She is well-developed.  HENT:     Head: Normocephalic and atraumatic.     Right Ear: Hearing, tympanic membrane, ear canal and external ear normal.     Left Ear: Hearing, tympanic membrane, ear canal and external ear normal.  Eyes:     General: No scleral icterus.       Right eye: No hordeolum.        Left eye: No hordeolum.     Conjunctiva/sclera: Conjunctivae normal.  Neck:     Thyroid: No thyromegaly.     Vascular: No carotid bruit.  Cardiovascular:     Rate and Rhythm: Normal rate and regular rhythm.  No extrasystoles are present.    Heart sounds: Normal heart sounds, S1 normal and S2 normal.  Pulmonary:     Effort: Pulmonary effort is normal. No respiratory distress.     Breath sounds: Normal breath sounds.  Chest:     Breasts: Breasts are symmetrical.        Right: No inverted nipple, mass, nipple  discharge, skin change or tenderness.        Left: No inverted nipple, mass, nipple discharge, skin change or tenderness.  Abdominal:     General: Bowel sounds are normal. There is no distension or abdominal bruit.     Palpations: Abdomen is soft. There is no mass or pulsatile mass.     Tenderness: There is no abdominal tenderness.     Hernia: No hernia is present.  Musculoskeletal: Normal range of motion.     Lumbar back: She exhibits tenderness. She exhibits normal range of motion, no bony tenderness, no swelling, no edema, no deformity and no spasm.       Back:  Lymphadenopathy:     Head:     Right side of head: No submandibular adenopathy.     Left side of head: No submandibular adenopathy.     Cervical: No cervical adenopathy.  Skin:    General: Skin is warm and dry.     Coloration: Skin is not pale.     Findings: No bruising or ecchymosis.     Comments: Dry skin, flaky on the flanks  Neurological:     Mental Status: She is alert.     Cranial Nerves: No cranial nerve deficit.     Motor: No weakness, tremor or abnormal muscle tone.     Gait: Gait normal.     Deep Tendon Reflexes:  Reflex Scores:      Patellar reflexes are 2+ on the right side and 2+ on the left side.    Comments: LE strength 5/5; normal dorsiflexion and plantarflexion  Psychiatric:        Mood and Affect: Mood is not anxious or depressed.        Speech: Speech normal.        Behavior: Behavior normal.        Thought Content: Thought content normal.     Results for orders placed or performed during the hospital encounter of 03/24/18  Pregnancy, urine POC  Result Value Ref Range   Preg Test, Ur NEGATIVE NEGATIVE      Assessment & Plan:   Problem List Items Addressed This Visit      Other   Vitamin D deficiency    Check level      Relevant Orders   VITAMIN D 25 Hydroxy (Vit-D Deficiency, Fractures)   Annual physical exam    USPSTF grade A and B recommendations reviewed with patient;  age-appropriate recommendations, preventive care, screening tests, etc discussed and encouraged; healthy living encouraged; see AVS for patient education given to patient       Relevant Orders   CBC with Differential/Platelet   Comprehensive metabolic panel   TSH   Lipid panel   Vitamin B12    Other Visit Diagnoses    Need for influenza vaccination    -  Primary   Relevant Orders   Flu Vaccine QUAD 6+ mos PF IM (Fluarix Quad PF) (Completed)   Chronic bilateral low back pain without sciatica       Relevant Orders   Ambulatory referral to Physical Therapy       Follow up plan: Return in about 1 year (around 05/02/2019) for complete physical.  An after-visit summary was printed and given to the patient at [place].  Please see the patient instructions which may contain other information and recommendations beyond what is mentioned above in the assessment and plan.  No orders of the defined types were placed in this encounter.   Orders Placed This Encounter  Procedures  . Flu Vaccine QUAD 6+ mos PF IM (Fluarix Quad PF)  . CBC with Differential/Platelet  . Comprehensive metabolic panel  . VITAMIN D 25 Hydroxy (Vit-D Deficiency, Fractures)  . TSH  . Lipid panel  . Vitamin B12  . Ambulatory referral to Physical Therapy

## 2018-05-01 NOTE — Assessment & Plan Note (Signed)
USPSTF grade A and B recommendations reviewed with patient; age-appropriate recommendations, preventive care, screening tests, etc discussed and encouraged; healthy living encouraged; see AVS for patient education given to patient  

## 2018-05-01 NOTE — Assessment & Plan Note (Signed)
Check level 

## 2018-05-01 NOTE — Patient Instructions (Addendum)
Try to get 800 iu of vitamin D3 once a day if not getting sun exposure Please have labs done If you have not heard anything from my staff in a week about any orders/referrals/studies from today, please contact us here to follow-up [phone]  Health Maintenance, Female Adopting a healthy lifestyle and getting preventive care can go a long way to promote health and wellness. Talk with your health care provider about what schedule of regular examinations is right for you. This is a good chance for you to check in with your provider about disease prevention and staying healthy. In between checkups, there are plenty of things you can do on your own. Experts have done a lot of research about which lifestyle changes and preventive measures are most likely to keep you healthy. Ask your health care provider for more information. Weight and diet Eat a healthy diet  Be sure to include plenty of vegetables, fruits, low-fat dairy products, and lean protein.  Do not eat a lot of foods high in solid fats, added sugars, or salt.  Get regular exercise. This is one of the most important things you can do for your health. ? Most adults should exercise for at least 150 minutes each week. The exercise should increase your heart rate and make you sweat (moderate-intensity exercise). ? Most adults should also do strengthening exercises at least twice a week. This is in addition to the moderate-intensity exercise. Maintain a healthy weight  Body mass index (BMI) is a measurement that can be used to identify possible weight problems. It estimates body fat based on height and weight. Your health care provider can help determine your BMI and help you achieve or maintain a healthy weight.  For females 13 years of age and older: ? A BMI below 18.5 is considered underweight. ? A BMI of 18.5 to 24.9 is normal. ? A BMI of 25 to 29.9 is considered overweight. ? A BMI of 30 and above is considered obese. Watch levels  of cholesterol and blood lipids  You should start having your blood tested for lipids and cholesterol at 29 years of age, then have this test every 5 years.  You may need to have your cholesterol levels checked more often if: ? Your lipid or cholesterol levels are high. ? You are older than 29 years of age. ? You are at high risk for heart disease. Cancer screening Lung Cancer  Lung cancer screening is recommended for adults 62-55 years old who are at high risk for lung cancer because of a history of smoking.  A yearly low-dose CT scan of the lungs is recommended for people who: ? Currently smoke. ? Have quit within the past 15 years. ? Have at least a 30-pack-year history of smoking. A pack year is smoking an average of one pack of cigarettes a day for 1 year.  Yearly screening should continue until it has been 15 years since you quit.  Yearly screening should stop if you develop a health problem that would prevent you from having lung cancer treatment. Breast Cancer  Practice breast self-awareness. This means understanding how your breasts normally appear and feel.  It also means doing regular breast self-exams. Let your health care provider know about any changes, no matter how small.  If you are in your 20s or 30s, you should have a clinical breast exam (CBE) by a health care provider every 1-3 years as part of a regular health exam.  If you are 40  or older, have a CBE every year. Also consider having a breast X-ray (mammogram) every year.  If you have a family history of breast cancer, talk to your health care provider about genetic screening.  If you are at high risk for breast cancer, talk to your health care provider about having an MRI and a mammogram every year.  Breast cancer gene (BRCA) assessment is recommended for women who have family members with BRCA-related cancers. BRCA-related cancers include: ? Breast. ? Ovarian. ? Tubal. ? Peritoneal cancers.  Results of  the assessment will determine the need for genetic counseling and BRCA1 and BRCA2 testing. Cervical Cancer Your health care provider may recommend that you be screened regularly for cancer of the pelvic organs (ovaries, uterus, and vagina). This screening involves a pelvic examination, including checking for microscopic changes to the surface of your cervix (Pap test). You may be encouraged to have this screening done every 3 years, beginning at age 29.  For women ages 62-65, health care providers may recommend pelvic exams and Pap testing every 3 years, or they may recommend the Pap and pelvic exam, combined with testing for human papilloma virus (HPV), every 5 years. Some types of HPV increase your risk of cervical cancer. Testing for HPV may also be done on women of any age with unclear Pap test results.  Other health care providers may not recommend any screening for nonpregnant women who are considered low risk for pelvic cancer and who do not have symptoms. Ask your health care provider if a screening pelvic exam is right for you.  If you have had past treatment for cervical cancer or a condition that could lead to cancer, you need Pap tests and screening for cancer for at least 20 years after your treatment. If Pap tests have been discontinued, your risk factors (such as having a new sexual partner) need to be reassessed to determine if screening should resume. Some women have medical problems that increase the chance of getting cervical cancer. In these cases, your health care provider may recommend more frequent screening and Pap tests. Colorectal Cancer  This type of cancer can be detected and often prevented.  Routine colorectal cancer screening usually begins at 29 years of age and continues through 29 years of age.  Your health care provider may recommend screening at an earlier age if you have risk factors for colon cancer.  Your health care provider may also recommend using home test  kits to check for hidden blood in the stool.  A small camera at the end of a tube can be used to examine your colon directly (sigmoidoscopy or colonoscopy). This is done to check for the earliest forms of colorectal cancer.  Routine screening usually begins at age 63.  Direct examination of the colon should be repeated every 5-10 years through 29 years of age. However, you may need to be screened more often if early forms of precancerous polyps or small growths are found. Skin Cancer  Check your skin from head to toe regularly.  Tell your health care provider about any new moles or changes in moles, especially if there is a change in a mole's shape or color.  Also tell your health care provider if you have a mole that is larger than the size of a pencil eraser.  Always use sunscreen. Apply sunscreen liberally and repeatedly throughout the day.  Protect yourself by wearing long sleeves, pants, a wide-brimmed hat, and sunglasses whenever you are outside. Heart disease,  diabetes, and high blood pressure  High blood pressure causes heart disease and increases the risk of stroke. High blood pressure is more likely to develop in: ? People who have blood pressure in the high end of the normal range (130-139/85-89 mm Hg). ? People who are overweight or obese. ? People who are African American.  If you are 52-15 years of age, have your blood pressure checked every 3-5 years. If you are 16 years of age or older, have your blood pressure checked every year. You should have your blood pressure measured twice-once when you are at a hospital or clinic, and once when you are not at a hospital or clinic. Record the average of the two measurements. To check your blood pressure when you are not at a hospital or clinic, you can use: ? An automated blood pressure machine at a pharmacy. ? A home blood pressure monitor.  If you are between 42 years and 22 years old, ask your health care provider if you should  take aspirin to prevent strokes.  Have regular diabetes screenings. This involves taking a blood sample to check your fasting blood sugar level. ? If you are at a normal weight and have a low risk for diabetes, have this test once every three years after 29 years of age. ? If you are overweight and have a high risk for diabetes, consider being tested at a younger age or more often. Preventing infection Hepatitis B  If you have a higher risk for hepatitis B, you should be screened for this virus. You are considered at high risk for hepatitis B if: ? You were born in a country where hepatitis B is common. Ask your health care provider which countries are considered high risk. ? Your parents were born in a high-risk country, and you have not been immunized against hepatitis B (hepatitis B vaccine). ? You have HIV or AIDS. ? You use needles to inject street drugs. ? You live with someone who has hepatitis B. ? You have had sex with someone who has hepatitis B. ? You get hemodialysis treatment. ? You take certain medicines for conditions, including cancer, organ transplantation, and autoimmune conditions. Hepatitis C  Blood testing is recommended for: ? Everyone born from [birth year] through 1966. ? Anyone with known risk factors for hepatitis C. Sexually transmitted infections (STIs)  You should be screened for sexually transmitted infections (STIs) including gonorrhea and chlamydia if: ? You are sexually active and are younger than 29 years of age. ? You are older than 29 years of age and your health care provider tells you that you are at risk for this type of infection. ? Your sexual activity has changed since you were last screened and you are at an increased risk for chlamydia or gonorrhea. Ask your health care provider if you are at risk.  If you do not have HIV, but are at risk, it may be recommended that you take a prescription medicine daily to prevent HIV infection. This is called  pre-exposure prophylaxis (PrEP). You are considered at risk if: ? You are sexually active and do not regularly use condoms or know the HIV status of your partner(s). ? You take drugs by injection. ? You are sexually active with a partner who has HIV. Talk with your health care provider about whether you are at high risk of being infected with HIV. If you choose to begin PrEP, you should first be tested for HIV. You should then be tested  every 3 months for as long as you are taking PrEP. Pregnancy  If you are premenopausal and you may become pregnant, ask your health care provider about preconception counseling.  If you may become pregnant, take 400 to 800 micrograms (mcg) of folic acid every day.  If you want to prevent pregnancy, talk to your health care provider about birth control (contraception). Osteoporosis and menopause  Osteoporosis is a disease in which the bones lose minerals and strength with aging. This can result in serious bone fractures. Your risk for osteoporosis can be identified using a bone density scan.  If you are 25 years of age or older, or if you are at risk for osteoporosis and fractures, ask your health care provider if you should be screened.  Ask your health care provider whether you should take a calcium or vitamin D supplement to lower your risk for osteoporosis.  Menopause may have certain physical symptoms and risks.  Hormone replacement therapy may reduce some of these symptoms and risks. Talk to your health care provider about whether hormone replacement therapy is right for you. Follow these instructions at home:  Schedule regular health, dental, and eye exams.  Stay current with your immunizations.  Do not use any tobacco products including cigarettes, chewing tobacco, or electronic cigarettes.  If you are pregnant, do not drink alcohol.  If you are breastfeeding, limit how much and how often you drink alcohol.  Limit alcohol intake to no more  than 1 drink per day for nonpregnant women. One drink equals 12 ounces of beer, 5 ounces of wine, or 1 ounces of hard liquor.  Do not use street drugs.  Do not share needles.  Ask your health care provider for help if you need support or information about quitting drugs.  Tell your health care provider if you often feel depressed.  Tell your health care provider if you have ever been abused or do not feel safe at home. This information is not intended to replace advice given to you by your health care provider. Make sure you discuss any questions you have with your health care provider. Document Released: 10/23/2010 Document Revised: 09/15/2015 Document Reviewed: 01/11/2015 Elsevier Interactive Patient Education  2019 [employer].

## 2018-05-02 LAB — CBC WITH DIFFERENTIAL/PLATELET
Basophils Absolute: 0.1 10*3/uL (ref 0.0–0.2)
Basos: 1 %
EOS (ABSOLUTE): 0.1 10*3/uL (ref 0.0–0.4)
Eos: 2 %
Hematocrit: 41.4 % (ref 34.0–46.6)
Hemoglobin: 14.2 g/dL (ref 11.1–15.9)
Immature Grans (Abs): 0 10*3/uL (ref 0.0–0.1)
Immature Granulocytes: 0 %
Lymphocytes Absolute: 2 10*3/uL (ref 0.7–3.1)
Lymphs: 37 %
MCH: 30.3 pg (ref 26.6–33.0)
MCHC: 34.3 g/dL (ref 31.5–35.7)
MCV: 89 fL (ref 79–97)
Monocytes Absolute: 0.4 10*3/uL (ref 0.1–0.9)
Monocytes: 8 %
Neutrophils Absolute: 2.8 10*3/uL (ref 1.4–7.0)
Neutrophils: 52 %
Platelets: 246 10*3/uL (ref 150–450)
RBC: 4.68 x10E6/uL (ref 3.77–5.28)
RDW: 12.4 % (ref 11.7–15.4)
WBC: 5.3 10*3/uL (ref 3.4–10.8)

## 2018-05-02 LAB — COMPREHENSIVE METABOLIC PANEL
ALT: 19 IU/L (ref 0–32)
AST: 18 IU/L (ref 0–40)
Albumin/Globulin Ratio: 2.3 — ABNORMAL HIGH (ref 1.2–2.2)
Albumin: 4.8 g/dL (ref 3.5–5.5)
Alkaline Phosphatase: 63 IU/L (ref 39–117)
BUN/Creatinine Ratio: 13 (ref 9–23)
BUN: 10 mg/dL (ref 6–20)
Bilirubin Total: 0.7 mg/dL (ref 0.0–1.2)
CO2: 22 mmol/L (ref 20–29)
Calcium: 9.4 mg/dL (ref 8.7–10.2)
Chloride: 102 mmol/L (ref 96–106)
Creatinine, Ser: 0.79 mg/dL (ref 0.57–1.00)
GFR calc Af Amer: 118 mL/min/{1.73_m2} (ref >59)
GFR calc non Af Amer: 102 mL/min/{1.73_m2} (ref >59)
Globulin, Total: 2.1 g/dL (ref 1.5–4.5)
Glucose: 82 mg/dL (ref 65–99)
Potassium: 4.1 mmol/L (ref 3.5–5.2)
Sodium: 137 mmol/L (ref 134–144)
Total Protein: 6.9 g/dL (ref 6.0–8.5)

## 2018-05-02 LAB — LIPID PANEL
Chol/HDL Ratio: 3.8 ratio (ref 0.0–4.4)
Cholesterol, Total: 178 mg/dL (ref 100–199)
HDL: 47 mg/dL (ref >39)
LDL Calculated: 118 mg/dL — ABNORMAL HIGH (ref 0–99)
Triglycerides: 64 mg/dL (ref 0–149)
VLDL Cholesterol Cal: 13 mg/dL (ref 5–40)

## 2018-05-02 LAB — VITAMIN B12: Vitamin B-12: 375 pg/mL (ref 232–1245)

## 2018-05-02 LAB — TSH: TSH: 1.85 u[IU]/mL (ref 0.450–4.500)

## 2018-05-02 LAB — VITAMIN D 25 HYDROXY (VIT D DEFICIENCY, FRACTURES): Vit D, 25-Hydroxy: 18.1 ng/mL — ABNORMAL LOW (ref 30.0–100.0)

## 2018-05-03 ENCOUNTER — Encounter: Payer: Self-pay | Admitting: Family Medicine

## 2018-05-05 ENCOUNTER — Other Ambulatory Visit: Payer: Self-pay | Admitting: Family Medicine

## 2018-05-05 MED ORDER — VITAMIN D (ERGOCALCIFEROL) 1.25 MG (50000 UNIT) PO CAPS: 50000.0000 [IU] | ORAL_CAPSULE | ORAL | 0 refills | Status: DC

## 2018-05-31 ENCOUNTER — Other Ambulatory Visit: Payer: Self-pay | Admitting: Family Medicine

## 2018-06-01 NOTE — Telephone Encounter (Signed)
Vit D Rx requested Please let her know this was short-term only From last lab results: "Your vitamin D is low, so I'll suggest 4 weeks of once a week vitamin D prescription. After that, just take 1,000 iu daily of over-the-counter vitamin D3."

## 2018-06-09 ENCOUNTER — Ambulatory Visit: Payer: 59 | Admitting: Gastroenterology

## 2018-06-16 ENCOUNTER — Emergency Department (HOSPITAL_COMMUNITY)
Admission: EM | Admit: 2018-06-16 | Discharge: 2018-06-16 | Disposition: A | Discharge status: Home / Self Care | Payer: 59 | Attending: Emergency Medicine | Admitting: Emergency Medicine

## 2018-06-16 ENCOUNTER — Other Ambulatory Visit: Payer: Self-pay

## 2018-06-16 ENCOUNTER — Encounter (HOSPITAL_COMMUNITY): Payer: Self-pay

## 2018-06-16 DIAGNOSIS — G43909 Migraine, unspecified, not intractable, without status migrainosus: Secondary | ICD-10-CM | POA: Diagnosis not present

## 2018-06-16 DIAGNOSIS — O99351 Diseases of the nervous system complicating pregnancy, first trimester: Secondary | ICD-10-CM | POA: Insufficient documentation

## 2018-06-16 DIAGNOSIS — Z79899 Other long term (current) drug therapy: Secondary | ICD-10-CM | POA: Insufficient documentation

## 2018-06-16 DIAGNOSIS — G43009 Migraine without aura, not intractable, without status migrainosus: Secondary | ICD-10-CM | POA: Diagnosis not present

## 2018-06-16 DIAGNOSIS — Z3A01 Less than 8 weeks gestation of pregnancy: Secondary | ICD-10-CM | POA: Diagnosis not present

## 2018-06-16 DIAGNOSIS — O99511 Diseases of the respiratory system complicating pregnancy, first trimester: Secondary | ICD-10-CM | POA: Diagnosis not present

## 2018-06-16 DIAGNOSIS — E876 Hypokalemia: Secondary | ICD-10-CM

## 2018-06-16 DIAGNOSIS — O99281 Endocrine, nutritional and metabolic diseases complicating pregnancy, first trimester: Secondary | ICD-10-CM | POA: Insufficient documentation

## 2018-06-16 DIAGNOSIS — J45909 Unspecified asthma, uncomplicated: Secondary | ICD-10-CM | POA: Diagnosis not present

## 2018-06-16 DIAGNOSIS — R002 Palpitations: Secondary | ICD-10-CM | POA: Insufficient documentation

## 2018-06-16 DIAGNOSIS — O99411 Diseases of the circulatory system complicating pregnancy, first trimester: Secondary | ICD-10-CM | POA: Insufficient documentation

## 2018-06-16 LAB — CBC WITH DIFFERENTIAL/PLATELET
Abs Immature Granulocytes: 0.02 10*3/uL (ref 0.00–0.07)
Basophils Absolute: 0 10*3/uL (ref 0.0–0.1)
Basophils Relative: 1 %
Eosinophils Absolute: 0.1 10*3/uL (ref 0.0–0.5)
Eosinophils Relative: 1 %
HCT: 37.8 % (ref 36.0–46.0)
Hemoglobin: 13.1 g/dL (ref 12.0–15.0)
Immature Granulocytes: 0 %
Lymphocytes Relative: 23 %
Lymphs Abs: 1.7 10*3/uL (ref 0.7–4.0)
MCH: 29.9 pg (ref 26.0–34.0)
MCHC: 34.7 g/dL (ref 30.0–36.0)
MCV: 86.3 fL (ref 80.0–100.0)
Monocytes Absolute: 0.5 10*3/uL (ref 0.1–1.0)
Monocytes Relative: 6 %
Neutro Abs: 5.1 10*3/uL (ref 1.7–7.7)
Neutrophils Relative %: 69 %
Platelets: 213 10*3/uL (ref 150–400)
RBC: 4.38 MIL/uL (ref 3.87–5.11)
RDW: 12 % (ref 11.5–15.5)
WBC: 7.4 10*3/uL (ref 4.0–10.5)
nRBC: 0 % (ref 0.0–0.2)

## 2018-06-16 LAB — COMPREHENSIVE METABOLIC PANEL
ALT: 21 U/L (ref 0–44)
AST: 19 U/L (ref 15–41)
Albumin: 3.9 g/dL (ref 3.5–5.0)
Alkaline Phosphatase: 40 U/L (ref 38–126)
Anion gap: 8 (ref 5–15)
BUN: 8 mg/dL (ref 6–20)
CO2: 22 mmol/L (ref 22–32)
Calcium: 8.7 mg/dL — ABNORMAL LOW (ref 8.9–10.3)
Chloride: 103 mmol/L (ref 98–111)
Creatinine, Ser: 0.68 mg/dL (ref 0.44–1.00)
GFR calc Af Amer: >60 mL/min (ref >60)
GFR calc non Af Amer: >60 mL/min (ref >60)
Glucose, Bld: 94 mg/dL (ref 70–99)
Potassium: 3.3 mmol/L — ABNORMAL LOW (ref 3.5–5.1)
Sodium: 133 mmol/L — ABNORMAL LOW (ref 135–145)
Total Bilirubin: 0.9 mg/dL (ref 0.3–1.2)
Total Protein: 6 g/dL — ABNORMAL LOW (ref 6.5–8.1)

## 2018-06-16 LAB — MAGNESIUM: Magnesium: 2 mg/dL (ref 1.7–2.4)

## 2018-06-16 LAB — URINALYSIS, ROUTINE W REFLEX MICROSCOPIC
Bilirubin Urine: NEGATIVE
Glucose, UA: NEGATIVE mg/dL
Hgb urine dipstick: NEGATIVE
Ketones, ur: 80 mg/dL — AB
Leukocytes,Ua: NEGATIVE
Nitrite: NEGATIVE
Protein, ur: NEGATIVE mg/dL
Specific Gravity, Urine: 1.005 (ref 1.005–1.030)
pH: 6 (ref 5.0–8.0)

## 2018-06-16 LAB — I-STAT BETA HCG BLOOD, ED (MC, WL, AP ONLY): I-stat hCG, quantitative: >2000 m[IU]/mL — ABNORMAL HIGH (ref <5)

## 2018-06-16 MED ORDER — POTASSIUM CHLORIDE CRYS ER 20 MEQ PO TBCR
40.0000 meq | EXTENDED_RELEASE_TABLET | Freq: Once | ORAL | Status: AC
  Administered 2018-06-16: 40 meq via ORAL
  Filled 2018-06-16: qty 2

## 2018-06-16 MED ORDER — DIPHENHYDRAMINE HCL 50 MG/ML IJ SOLN
25.0000 mg | Freq: Once | INTRAMUSCULAR | Status: AC
  Administered 2018-06-16: 25 mg via INTRAVENOUS
  Filled 2018-06-16: qty 1

## 2018-06-16 MED ORDER — PROCHLORPERAZINE EDISYLATE 10 MG/2ML IJ SOLN
10.0000 mg | Freq: Once | INTRAMUSCULAR | Status: AC
  Administered 2018-06-16: 10 mg via INTRAVENOUS
  Filled 2018-06-16: qty 2

## 2018-06-16 MED ORDER — SODIUM CHLORIDE 0.9 % IV BOLUS
1000.0000 mL | Freq: Once | INTRAVENOUS | Status: AC
  Administered 2018-06-16: 1000 mL via INTRAVENOUS

## 2018-06-16 MED ORDER — KETOROLAC TROMETHAMINE 30 MG/ML IJ SOLN
30.0000 mg | Freq: Once | INTRAMUSCULAR | Status: DC
  Filled 2018-06-16: qty 1

## 2018-06-16 MED ORDER — ACETAMINOPHEN 325 MG PO TABS
650.0000 mg | ORAL_TABLET | Freq: Once | ORAL | Status: AC
  Administered 2018-06-16: 650 mg via ORAL
  Filled 2018-06-16: qty 2

## 2018-06-16 NOTE — ED Provider Notes (Signed)
[facility] EMERGENCY DEPARTMENT Provider Note   CSN: [id number] Arrival date & time: 06/16/18  0605    History   Chief Complaint Chief Complaint  Patient presents with  . Palpitations  . Headache  . Emesis    HPI Hayley Hunt is a 28 y.o. female.     Hayley Hunt is a 28 y.o. female with a history of asthma, eczema, and ovarian cyst, who presents to the emergency department for evaluation of headache, nausea, vomiting and palpitations.  Patient reports that she got a headache on Friday that started in the front of her head, traveled around to the back and has remained for the past 3 days.  She reports this feels like her typical migraine although has lasted longer than usual.  She reports some associated photophobia and phonophobia but no vision changes or diplopia.  She reports that she typically has nausea with her headaches but this is been a bit worse than usual this time.  She denies any associated abdominal pain, no dysuria or urinary frequency, no diarrhea or constipation.  Denies any pelvic pain, vaginal bleeding or vaginal discharge.  She also reports that since Friday it is felt as though her heart is racing, this started after the headache, and is not typical for her migraines.  She denies any associated chest pain, chest tightness or shortness of breath.  No lightheadedness or syncope.  No pain or swelling in her lower extremities.  She does not have any history of heart problems or arrhythmia in the past, no family history of sudden cardiac death or arrhythmia.  Does have family history of thyroid disorder, but recently had her thyroid function studies checked earlier this month and they were normal.  LMP 04/28/2018 but patient reports this is typical for her and she has very irregular menstrual cycles.     Past Medical History:  Diagnosis Date  . Allergic asthma without acute exacerbation or status asthmaticus   . Asthma, mild 09/13/2015  . Common  ichthyosis   . Eczema   . History of abnormal cervical Pap smear   . Ovarian cyst     Patient Active Problem List   Diagnosis Date Noted  . Vitamin D deficiency 05/01/2018  . Rectal bleeding   . Gallbladder sludge 05/07/2017  . Elevated LDH 05/01/2017  . Elevated serum glutamic pyruvic transaminase (SGPT) level 05/01/2017  . Nipple crusting 09/10/2016  . Abnormal appearance of cervix 04/29/2016  . Fatigue 04/27/2016  . Preventative health care 04/27/2016  . Laryngopharyngeal reflux (LPR) 12/25/2015  . Asthma, mild 09/13/2015  . Abdominal bloating 07/29/2015  . Postprandial abdominal bloating 07/29/2015  . Change in stool habits 07/29/2015  . Intestinal gas excretion 07/29/2015  . Abdominal pain, lower 07/29/2015  . Annual physical exam 04/04/2015  . Encounter for screening for malignant neoplasm of cervix 04/04/2015  . Allergic rhinitis 11/22/2014  . Dermatitis, eczematoid 11/22/2014  . H/O abnormal cervical Papanicolaou smear 11/22/2014  . Wrist pain, right 11/22/2014    Past Surgical History:  Procedure Laterality Date  . FLEXIBLE SIGMOIDOSCOPY  03/24/2018   Procedure: FLEXIBLE SIGMOIDOSCOPY;  Surgeon: Lin Landsman, MD;  Location: [facility] ENDOSCOPY;  Service: Gastroenterology;;  . Arnetha Courser TOOTH EXTRACTION       OB History    Gravida  0   Para  0   Term  0   Preterm  0   AB  0   Living  0     SAB  0  TAB  0   Ectopic  0   Multiple  0   Live Births  0        Obstetric Comments  1st Menstrual Cycle:  11            Home Medications    Prior to Admission medications   Medication Sig Start Date End Date Taking? Authorizing Provider  albuterol (VENTOLIN HFA) 108 (90 BASE) MCG/ACT inhaler Inhale 1-2 puffs into the lungs every 6 (six) hours as needed. 05/27/14   [provider]  ARNUITY ELLIPTA 100 MCG/ACT AEPB Inhale 1 puff into the lungs daily. 08/26/16   [provider]  Azelastine-Fluticasone (DYMISTA) 137-50 MCG/ACT SUSP  Place 2 sprays into the nose daily.    [provider]  ipratropium-albuterol (DUONEB) 0.5-2.5 (3) MG/3ML SOLN Take 3 mLs by nebulization every 6 (six) hours as needed. 01/24/17   Arnetha Courser, MD  loratadine (CLARITIN) 10 MG tablet Take 1 tablet (10 mg total) by mouth daily as needed for allergies. 01/24/17   Lada, Satira Anis, MD  LORazepam (ATIVAN) 0.5 MG tablet Take 0.5-1 tablets (0.25-0.5 mg total) by mouth 2 (two) times daily as needed for anxiety. Patient not taking: Reported on 05/01/2018 05/11/17   Arnetha Courser, MD  mometasone (ELOCON) 0.1 % lotion  03/04/18   [provider]  montelukast (SINGULAIR) 10 MG tablet Take 10 mg every evening by mouth. 02/21/17   [provider]  norethindrone (MICRONOR,CAMILA,ERRIN) 0.35 MG tablet Take 1 tablet (0.35 mg total) by mouth daily. Patient not taking: Reported on 03/06/2018 06/26/17   Arnetha Courser, MD  Vitamin D, Ergocalciferol, (DRISDOL) 1.25 MG (50000 UT) CAPS capsule Take 1 capsule (50,000 Units total) by mouth every 7 (seven) days. 05/05/18   Arnetha Courser, MD    Family History Family History  Problem Relation Age of Onset  . Asthma Mother   . Hypothyroidism Mother   . Osteoporosis Maternal Grandmother   . Heart attack Maternal Grandfather   . Heart disease Maternal Grandfather   . Cancer Paternal Grandfather        lung  . Breast cancer Neg Hx   . Ovarian cancer Neg Hx     Social History Social History   Tobacco Use  . Smoking status: Never Smoker  . Smokeless tobacco: Never Used  Substance Use Topics  . Alcohol use: No    Alcohol/week: 0.0 standard drinks  . Drug use: No     Allergies   Amoxicillin; Clindamycin/lincomycin; Metronidazole; Penicillins; and Latex   Review of Systems Review of Systems  Constitutional: Negative for chills and fever.  HENT: Negative.   Eyes: Positive for photophobia. Negative for pain and visual disturbance.  Respiratory: Negative for cough and shortness of  breath.   Cardiovascular: Positive for palpitations. Negative for chest pain and leg swelling.  Gastrointestinal: Positive for nausea and vomiting. Negative for abdominal pain, blood in stool, constipation and diarrhea.  Genitourinary: Negative for dysuria, frequency, pelvic pain, vaginal bleeding and vaginal discharge.  Musculoskeletal: Negative for arthralgias, myalgias, neck pain and neck stiffness.  Skin: Negative for color change and rash.  Neurological: Positive for dizziness and headaches. Negative for syncope, facial asymmetry, speech difficulty, weakness, light-headedness and numbness.     Physical Exam Updated Vital Signs BP 111/73   Pulse 76   Temp 98.3 F (36.8 C) (Oral)   Resp (!) 21   Ht 5' (1.524 m)   Wt 59 kg   LMP 04/28/2018 (Exact Date)  SpO2 100%   BMI 25.39 kg/m   Physical Exam Vitals signs and nursing note reviewed.  Constitutional:      General: She is not in acute distress.    Appearance: Normal appearance. She is well-developed and normal weight. She is not ill-appearing or diaphoretic.  HENT:     Head: Normocephalic and atraumatic.     Mouth/Throat:     Mouth: Mucous membranes are moist.     Pharynx: Oropharynx is clear.  Eyes:     General:        Right eye: No discharge.        Left eye: No discharge.     Extraocular Movements: Extraocular movements intact.     Right eye: No nystagmus.     Left eye: No nystagmus.     Pupils: Pupils are equal, round, and reactive to light.  Neck:     Musculoskeletal: Neck supple. No neck rigidity.     Meningeal: Brudzinski's sign absent.     Comments: No nuchal rigidity Cardiovascular:     Rate and Rhythm: Normal rate and regular rhythm.     Heart sounds: Normal heart sounds. No murmur. No friction rub. No gallop.      Comments: Heart rate ranging from 90s to low 100s with regular rhythm Pulmonary:     Effort: Pulmonary effort is normal. No respiratory distress.     Breath sounds: Normal breath sounds. No  wheezing or rales.     Comments: Respirations equal and unlabored, patient able to speak in full sentences, lungs clear to auscultation bilaterally Abdominal:     General: Bowel sounds are normal. There is no distension.     Palpations: Abdomen is soft. There is no mass.     Tenderness: There is no abdominal tenderness. There is no guarding.     Comments: Abdomen soft, nondistended, nontender to palpation in all quadrants without guarding or peritoneal signs  Musculoskeletal:        General: No deformity.  Skin:    General: Skin is warm and dry.     Capillary Refill: Capillary refill takes less than 2 seconds.  Neurological:     Mental Status: She is alert.     GCS: GCS eye subscore is 4. GCS verbal subscore is 5. GCS motor subscore is 6.     Coordination: Coordination normal.     Comments: Speech is clear, able to follow commands CN III-XII intact Normal strength in upper and lower extremities bilaterally including dorsiflexion and plantar flexion, strong and equal grip strength Sensation normal to light and sharp touch Moves extremities without ataxia, coordination intact Normal finger to nose and rapid alternating movements No pronator drift   Psychiatric:        Mood and Affect: Mood normal.        Behavior: Behavior normal.      ED Treatments / Results  Labs (all labs ordered are listed, but only abnormal results are displayed) Labs Reviewed  COMPREHENSIVE METABOLIC PANEL - Abnormal; Notable for the following components:      Result Value   Sodium 133 (*)    Potassium 3.3 (*)    Calcium 8.7 (*)    Total Protein 6.0 (*)    All other components within normal limits  I-STAT BETA HCG BLOOD, ED ([facility], [facility], [facility] ONLY) - Abnormal; Notable for the following components:   I-stat hCG, quantitative >2,000.0 (*)    All other components within normal limits  CBC WITH DIFFERENTIAL/PLATELET  MAGNESIUM  URINALYSIS, ROUTINE W REFLEX MICROSCOPIC    EKG EKG  Interpretation  Date/Time:  Monday June 16 2018 06:23:05 EST Ventricular Rate:  101 PR Interval:    QRS Duration: 89 QT Interval:  356 QTC Calculation: 462 R Axis:   57 Text Interpretation:  Sinus tachycardia RSR' in V1 or V2, probably normal variant Nonspecific T abnormalities, anterior leads No old tracing to compare Confirmed by Ward, Cyril Mourning [phone]) on 06/16/2018 6:36:52 AM   Radiology No results found.  Procedures Procedures (including critical care time)  Medications Ordered in ED Medications  potassium chloride SA (K-DUR,KLOR-CON) CR tablet 40 mEq (has no administration in time range)  sodium chloride 0.9 % bolus 1,000 mL (1,000 mLs Intravenous Bolus from Bag 06/16/18 0712)  prochlorperazine (COMPAZINE) injection 10 mg (10 mg Intravenous Given 06/16/18 0720)  diphenhydrAMINE (BENADRYL) injection 25 mg (25 mg Intravenous Given 06/16/18 0720)     Initial Impression / Assessment and Plan / ED Course  I have reviewed the triage vital signs and the nursing notes.  Pertinent labs & imaging results that were available during my care of the patient were reviewed by me and considered in my medical decision making (see chart for details).  Clinical Course as of Jun 16 948  Mon Jun 16, 2018  0650 27 F presents for evaluation of HA, N/V and palpitations. Known hx of migraines, recently had normal MRI. HA is unchanged from her usual aside from lasting longer than normal. No neuro deficits on exam   [KF]  0700 EKG shows sinus tachycardia, no arrhythmia or other concerning changes, heart rate ranging from 90-108 while in room evaluation pt, she appears very anxious  EKG 12-Lead [KF]  0715 Positive hCG, based on LMP of 04/28/2018 patient is approximately [redacted] weeks pregnant.  This could certainly explain patient's palpitations and elevated heart rate, as well as nausea that is worse than with her usual headaches.  Discussed result with patient.  Will discontinue Toradol from migraine  cocktail but continue with Compazine, Benadryl and IV fluids.  I-Stat beta hCG blood, ED(!) [KF]  0730 Mild hypokalemia of 3.3, unable to tolerate potassium tablets will have her increase potassium rich foods at home, mild hyponatremia of 133 which should improve with IV fluids given, no other acute electrolyte derangements, normal renal and liver function.  Magnesium is normal as well.  Comprehensive metabolic panel(!) [KF]  0160 No leukocytosis and normal hemoglobin.  CBC with Differential [KF]  0825 Patient reports nausea and heart racing sensation resolved with migraine cocktail and headache is improving she still has dull headache, will give dose of Tylenol   [KF]  0943 Headache has resolved and patient is stable for discharge home.  Patient will follow-up with her OB/GYN, counseled patient on starting prenatal vitamins.  Discussed strategies for managing nausea and vomiting in pregnancy.  Return precautions discussed.  Patient stable for discharge home.   [KF]    Clinical Course User Index [KF] Jacqlyn Larsen, PA-C    Final Clinical Impressions(s) / ED Diagnoses   Final diagnoses:  Migraine without status migrainosus, not intractable, unspecified migraine type  Less than [redacted] weeks gestation of pregnancy  Palpitations  Hypokalemia    ED Discharge Orders    None       Jacqlyn Larsen, [place] 06/16/18 1021    Ward, Delice Bison, DO 06/16/18 2349

## 2018-06-16 NOTE — ED Notes (Signed)
Patient attempted swallowing potassium pill but unable to.

## 2018-06-16 NOTE — Discharge Instructions (Addendum)
You are approximately [redacted] weeks pregnant, please begin taking prenatal vitamins and call to schedule follow-up with your OB/GYN.  In order to help with nausea and vomiting, eat small frequent meals, you can use ginger candies or over-the-counter doxylamine (Unisom).  It is safe to take Tylenol as needed for your migraines, avoid ibuprofen or Aleve.  Your potassium was slightly low today, increase potassium rich foods in your diet and have this rechecked with your primary doctor.  Return to the emergency department if you have significantly worsened headache, vision changes, numbness or weakness, persistent vomiting despite medications and strategies discussed, fevers or any other new or concerning symptoms.

## 2018-06-23 DIAGNOSIS — N911 Secondary amenorrhea: Secondary | ICD-10-CM | POA: Diagnosis not present

## 2018-07-02 DIAGNOSIS — Z3685 Encounter for antenatal screening for Streptococcus B: Secondary | ICD-10-CM | POA: Diagnosis not present

## 2018-07-02 DIAGNOSIS — Z3481 Encounter for supervision of other normal pregnancy, first trimester: Secondary | ICD-10-CM | POA: Diagnosis not present

## 2018-07-02 DIAGNOSIS — E559 Vitamin D deficiency, unspecified: Secondary | ICD-10-CM | POA: Diagnosis not present

## 2018-07-02 LAB — OB RESULTS CONSOLE RPR: RPR: NONREACTIVE

## 2018-07-02 LAB — OB RESULTS CONSOLE GBS: GBS: POSITIVE

## 2018-07-02 LAB — OB RESULTS CONSOLE GC/CHLAMYDIA
Chlamydia: NEGATIVE
Gonorrhea: NEGATIVE

## 2018-07-02 LAB — OB RESULTS CONSOLE HIV ANTIBODY (ROUTINE TESTING): HIV: NONREACTIVE

## 2018-07-02 LAB — OB RESULTS CONSOLE RUBELLA ANTIBODY, IGM: Rubella: IMMUNE

## 2018-07-02 LAB — OB RESULTS CONSOLE HEPATITIS B SURFACE ANTIGEN: Hepatitis B Surface Ag: NEGATIVE

## 2018-07-07 DIAGNOSIS — O209 Hemorrhage in early pregnancy, unspecified: Secondary | ICD-10-CM | POA: Diagnosis not present

## 2018-07-15 DIAGNOSIS — O4691 Antepartum hemorrhage, unspecified, first trimester: Secondary | ICD-10-CM | POA: Diagnosis not present

## 2018-07-15 DIAGNOSIS — Z3A10 10 weeks gestation of pregnancy: Secondary | ICD-10-CM | POA: Diagnosis not present

## 2018-07-24 DIAGNOSIS — Z3A12 12 weeks gestation of pregnancy: Secondary | ICD-10-CM | POA: Diagnosis not present

## 2018-08-18 DIAGNOSIS — Z3481 Encounter for supervision of other normal pregnancy, first trimester: Secondary | ICD-10-CM | POA: Diagnosis not present

## 2018-09-05 DIAGNOSIS — Z1283 Encounter for screening for malignant neoplasm of skin: Secondary | ICD-10-CM | POA: Diagnosis not present

## 2018-09-05 DIAGNOSIS — D225 Melanocytic nevi of trunk: Secondary | ICD-10-CM | POA: Diagnosis not present

## 2018-09-09 DIAGNOSIS — Z3A18 18 weeks gestation of pregnancy: Secondary | ICD-10-CM | POA: Diagnosis not present

## 2018-09-12 ENCOUNTER — Encounter (HOSPITAL_COMMUNITY): Payer: Self-pay | Admitting: Emergency Medicine

## 2018-09-12 ENCOUNTER — Other Ambulatory Visit: Payer: Self-pay

## 2018-09-12 ENCOUNTER — Emergency Department (HOSPITAL_COMMUNITY)
Admission: EM | Admit: 2018-09-12 | Discharge: 2018-09-12 | Disposition: A | Discharge status: Home / Self Care | Payer: 59 | Attending: Emergency Medicine | Admitting: Emergency Medicine

## 2018-09-12 DIAGNOSIS — J45909 Unspecified asthma, uncomplicated: Secondary | ICD-10-CM | POA: Insufficient documentation

## 2018-09-12 DIAGNOSIS — R21 Rash and other nonspecific skin eruption: Secondary | ICD-10-CM | POA: Diagnosis not present

## 2018-09-12 DIAGNOSIS — Z79899 Other long term (current) drug therapy: Secondary | ICD-10-CM | POA: Insufficient documentation

## 2018-09-12 DIAGNOSIS — Z3A19 19 weeks gestation of pregnancy: Secondary | ICD-10-CM | POA: Insufficient documentation

## 2018-09-12 DIAGNOSIS — Z91040 Latex allergy status: Secondary | ICD-10-CM | POA: Insufficient documentation

## 2018-09-12 DIAGNOSIS — O99712 Diseases of the skin and subcutaneous tissue complicating pregnancy, second trimester: Secondary | ICD-10-CM | POA: Insufficient documentation

## 2018-09-12 DIAGNOSIS — O99512 Diseases of the respiratory system complicating pregnancy, second trimester: Secondary | ICD-10-CM | POA: Insufficient documentation

## 2018-09-12 MED ORDER — HYDROCORTISONE 1 % EX CREA: TOPICAL_CREAM | CUTANEOUS | 0 refills | Status: DC

## 2018-09-12 MED ORDER — HYDROXYZINE HCL 25 MG PO TABS: 25.0000 mg | ORAL_TABLET | Freq: Four times a day (QID) | ORAL | 0 refills | Status: DC

## 2018-09-12 NOTE — ED Provider Notes (Signed)
[place] EMERGENCY DEPARTMENT Provider Note   CSN: [id number] Arrival date & time: 09/12/18  1705    History   Chief Complaint Chief Complaint  Patient presents with  . Rash    HPI Hayley Hunt is a 28 y.o. female.     HPI  Patient is a G1, P1 currently [redacted] weeks pregnant 28 year old female with past medical history of eczema, asthma, common ichthyosis, presenting for diffuse maculopapular rash.  Patient reports that the rash been present for approximately 6 days.  Patient describes the lesions as pruritic.  Initially began in the inner thighs and on the chest.  Spares the palms and soles.  She denies any mucous membrane lesions, shortness of breath, wheezing, chest tightness, abdominal pain, vaginal bleeding, vaginal discharge.  She reports that she initially thought it was due to changing her soap so she switched back to her old soap however after 4 days it has not improved.  Patient does have a history of being admitted to [facility] approximately 1 year ago for an SJS rule out in the setting of an antibiotic prescription.  This was ultimately ruled out with biopsy.  Patient denies any other similar skin symptoms ever since then.  She denies any exposure to individuals with scabies or other similar symptoms.  Past Medical History:  Diagnosis Date  . Allergic asthma without acute exacerbation or status asthmaticus   . Asthma, mild 09/13/2015  . Common ichthyosis   . Eczema   . History of abnormal cervical Pap smear   . Ovarian cyst     Patient Active Problem List   Diagnosis Date Noted  . Vitamin D deficiency 05/01/2018  . Rectal bleeding   . Gallbladder sludge 05/07/2017  . Elevated LDH 05/01/2017  . Elevated serum glutamic pyruvic transaminase (SGPT) level 05/01/2017  . Nipple crusting 09/10/2016  . Abnormal appearance of cervix 04/29/2016  . Fatigue 04/27/2016  . Preventative health care 04/27/2016  . Laryngopharyngeal reflux (LPR) 12/25/2015  .  Asthma, mild 09/13/2015  . Abdominal bloating 07/29/2015  . Postprandial abdominal bloating 07/29/2015  . Change in stool habits 07/29/2015  . Intestinal gas excretion 07/29/2015  . Abdominal pain, lower 07/29/2015  . Annual physical exam 04/04/2015  . Encounter for screening for malignant neoplasm of cervix 04/04/2015  . Allergic rhinitis 11/22/2014  . Dermatitis, eczematoid 11/22/2014  . H/O abnormal cervical Papanicolaou smear 11/22/2014  . Wrist pain, right 11/22/2014    Past Surgical History:  Procedure Laterality Date  . FLEXIBLE SIGMOIDOSCOPY  03/24/2018   Procedure: FLEXIBLE SIGMOIDOSCOPY;  Surgeon: Lin Landsman, MD;  Location: [facility] ENDOSCOPY;  Service: Gastroenterology;;  . Arnetha Courser TOOTH EXTRACTION       OB History    Gravida  1   Para  0   Term  0   Preterm  0   AB  0   Living  0     SAB  0   TAB  0   Ectopic  0   Multiple  0   Live Births  0        Obstetric Comments  1st Menstrual Cycle:  12            Home Medications    Prior to Admission medications   Medication Sig Start Date End Date Taking? Authorizing Provider  albuterol (VENTOLIN HFA) 108 (90 BASE) MCG/ACT inhaler Inhale 1-2 puffs into the lungs every 6 (six) hours as needed for wheezing or shortness of breath.  05/27/14  [provider]  loratadine (CLARITIN) 10 MG tablet Take 1 tablet (10 mg total) by mouth daily as needed for allergies. 01/24/17   Lada, Satira Anis, MD  LORazepam (ATIVAN) 0.5 MG tablet Take 0.5-1 tablets (0.25-0.5 mg total) by mouth 2 (two) times daily as needed for anxiety. Patient not taking: Reported on 05/01/2018 05/11/17   Arnetha Courser, MD  norethindrone (MICRONOR,CAMILA,ERRIN) 0.35 MG tablet Take 1 tablet (0.35 mg total) by mouth daily. Patient not taking: Reported on 03/06/2018 06/26/17   Arnetha Courser, MD  Vitamin D, Ergocalciferol, (DRISDOL) 1.25 MG (50000 UT) CAPS capsule Take 1 capsule (50,000 Units total) by mouth every 7 (seven) days.  Patient taking differently: Take 50,000 Units by mouth every Monday.  05/05/18   Arnetha Courser, MD    Family History Family History  Problem Relation Age of Onset  . Asthma Mother   . Hypothyroidism Mother   . Osteoporosis Maternal Grandmother   . Heart attack Maternal Grandfather   . Heart disease Maternal Grandfather   . Cancer Paternal Grandfather        lung  . Breast cancer Neg Hx   . Ovarian cancer Neg Hx     Social History Social History   Tobacco Use  . Smoking status: Never Smoker  . Smokeless tobacco: Never Used  Substance Use Topics  . Alcohol use: No    Alcohol/week: 0.0 standard drinks  . Drug use: No     Allergies   Amoxicillin; Clindamycin/lincomycin; Metronidazole; Penicillins; and Latex   Review of Systems Review of Systems  Constitutional: Negative for chills and fever.  Gastrointestinal: Negative for abdominal pain, nausea and vomiting.  Genitourinary: Negative for vaginal bleeding and vaginal discharge.  Musculoskeletal: Negative for arthralgias and myalgias.  Skin: Positive for rash. Negative for color change and wound.     Physical Exam Updated Vital Signs BP 127/78 (BP Location: Right Arm)   Pulse (!) 115   Temp 98.5 F (36.9 C) (Oral)   Resp 18   LMP 04/28/2018 (Exact Date)   SpO2 100%   Physical Exam Vitals signs and nursing note reviewed.  Constitutional:      General: She is not in acute distress.    Appearance: She is well-developed. She is not diaphoretic.     Comments: Sitting comfortably in bed.  HENT:     Head: Normocephalic and atraumatic.     Mouth/Throat:     Comments: No intraoral lesions.  Eyes:     General:        Right eye: No discharge.        Left eye: No discharge.     Conjunctiva/sclera: Conjunctivae normal.     Comments: EOMs normal to gross examination.  Neck:     Musculoskeletal: Normal range of motion.  Cardiovascular:     Rate and Rhythm: Normal rate and regular rhythm.     Heart sounds: Normal  heart sounds.     Comments: Intact, 2+ radial pulse.  Pulmonary:     Effort: Pulmonary effort is normal.     Breath sounds: Normal breath sounds. No wheezing or rales.  Abdominal:     General: There is no distension.     Comments: Gravid abdomen.   Musculoskeletal: Normal range of motion.  Skin:    General: Skin is warm and dry.     Findings: Rash present.     Comments: See clinical photo for details.  Patient has a fine maculopapular rash over the anterior medial thighs,  chest, dorsum of arms.  Spares palms and soles.  It is blanching.  No petechial lesions.  Patient describes it as pruritic.  Neurological:     Mental Status: She is alert.     Comments: Cranial nerves intact to gross observation. Patient moves extremities without difficulty.  Psychiatric:        Behavior: Behavior normal.        Thought Content: Thought content normal.        Judgment: Judgment normal.        ED Treatments / Results  Labs (all labs ordered are listed, but only abnormal results are displayed) Labs Reviewed - No data to display  EKG None  Radiology No results found.  Procedures Procedures (including critical care time)  Medications Ordered in ED Medications - No data to display   Initial Impression / Assessment and Plan / ED Course  I have reviewed the triage vital signs and the nursing notes.  Pertinent labs & imaging results that were available during my care of the patient were reviewed by me and considered in my medical decision making (see chart for details).        This is a well-appearing, 19-week G1, P6 female presenting for diffuse fine rash for 6 days.  Clinically, presentation not consistent with SJS, TEN, scabies, cutaneous manifestations of systemic disease.  Case was discussed with Threasa Beards, APP at [facility] to discuss whether patient requires evaluation in the [facility]. She states that presentation early in pregnancy for cholestasis of pregnancy other cutaneous manifestations  that would need further work-up.  She recommends hydrocortisone 1% cream, hydroxyzine, and follow-up with her OB.  Appreciate her involvement.  Discussed with attending physician who is in agreement with plan of care.  Patient prescribed hydroxyzine and 1% hydrocortisone.  She is to follow-up with OB.  She is given return precautions for any worsening lesions, petechial lesions, abdominal pain, bleeding, decreased fetal movement, fever, or any manifestations of systemic disease.  Patient is in understanding and agrees with plan of care.  Final Clinical Impressions(s) / ED Diagnoses   Final diagnoses:  Rash and nonspecific skin eruption    ED Discharge Orders    None       Tamala Julian 09/12/18 2019    Duffy Bruce, MD 09/15/18 1016

## 2018-09-12 NOTE — ED Triage Notes (Signed)
Pt reports itchy rash and hives from upper thighs up to face since Sunday. Denies coming in contact with any known allergens.

## 2018-09-12 NOTE — Discharge Instructions (Signed)
Please see the information and instructions below regarding your visit.  Your diagnoses today include:  1. Rash and nonspecific skin eruption     Tests performed today include: See side panel of your discharge paperwork for testing performed today. Vital signs are listed at the bottom of these instructions.   Medications prescribed:    Take any prescribed medications only as prescribed, and any over the counter medications only as directed on the packaging.  Please take Atarax every 6 hours as needed for itching.  Please apply the hydrocortisone cream only in areas of greatest concentration of the rash twice a day for 5 days.  Home care instructions:  Please follow any educational materials contained in this packet.   I recommend also using emollient lotion such as Eucerin.  Follow-up instructions: Please follow-up with Dr. Corinna Capra on Tuesday of next week if not improving.   Return instructions:  Please return to the Emergency Department if you experience worsening symptoms.  Please return the emergency department he develop any worsening or spreading rashes, fevers with rash, joint pain, severe abdominal cramping, vaginal bleeding, or nausea or vomiting. Please return if you have any other emergent concerns.  Additional Information:   Your vital signs today were: BP 127/78 (BP Location: Right Arm)    Pulse (!) 115    Temp 98.5 F (36.9 C) (Oral)    Resp 18    LMP 04/28/2018 (Exact Date)    SpO2 100%  If your blood pressure (BP) was elevated on multiple readings during this visit above 130 for the top number or above 80 for the bottom number, please have this repeated by your primary care provider within one month. --------------  Thank you for allowing [place] to participate in your care today.

## 2018-10-14 ENCOUNTER — Telehealth: Payer: Self-pay | Admitting: *Deleted

## 2018-10-14 ENCOUNTER — Ambulatory Visit: Payer: 59 | Admitting: Nurse Practitioner

## 2018-10-14 ENCOUNTER — Telehealth: Payer: Self-pay | Admitting: Nurse Practitioner

## 2018-10-14 ENCOUNTER — Encounter: Payer: Self-pay | Admitting: Nurse Practitioner

## 2018-10-14 ENCOUNTER — Ambulatory Visit: Payer: Self-pay | Admitting: Family Medicine

## 2018-10-14 VITALS — HR 74 | Temp 100.0°F | Resp 16

## 2018-10-14 DIAGNOSIS — R05 Cough: Secondary | ICD-10-CM

## 2018-10-14 DIAGNOSIS — R0981 Nasal congestion: Secondary | ICD-10-CM

## 2018-10-14 DIAGNOSIS — J4521 Mild intermittent asthma with (acute) exacerbation: Secondary | ICD-10-CM

## 2018-10-14 DIAGNOSIS — Z20822 Contact with and (suspected) exposure to covid-19: Secondary | ICD-10-CM

## 2018-10-14 DIAGNOSIS — R059 Cough, unspecified: Secondary | ICD-10-CM

## 2018-10-14 DIAGNOSIS — R509 Fever, unspecified: Secondary | ICD-10-CM

## 2018-10-14 NOTE — Progress Notes (Signed)
Virtual Visit via Video Note  I connected with Hayley Hunt on 10/14/18 at  4:00 PM EDT by a video enabled telemedicine application and verified that I am speaking with the correct person using two identifiers.   Staff discussed the limitations of evaluation and management by telemedicine and the availability of in person appointments. The patient expressed understanding and agreed to proceed.  Patient location: home  My location: work office Other people present:  none HPI  Patient was outside all Sunday, in the evening she started sneezing. States yesterday she had nasal congestion and ear fullness. This morning started to get strong productive cough and fevers started this morning. Temp was 100.34F this morning she took benadryl this morning and took tylenol 1000 mg rechecked temperature an hour later and it was 100F. Patient endorses mild exertional shortness of breath and wheezing last night.   Denies nausea, vomiting, diarrhea, chest pain.  PHQ2/9: Depression screen [facility] 2/9 10/14/2018 05/01/2018 10/21/2017 06/26/2017 04/30/2017  Decreased Interest 0 0 0 0 0  Down, Depressed, Hopeless 0 0 0 0 0  PHQ - 2 Score 0 0 0 0 0  Altered sleeping 0 0 - - -  Tired, decreased energy 0 0 - - -  Change in appetite 0 0 - - -  Feeling bad or failure about yourself  0 0 - - -  Trouble concentrating 0 0 - - -  Moving slowly or fidgety/restless 0 0 - - -  Suicidal thoughts 0 0 - - -  PHQ-9 Score 0 0 - - -  Difficult doing work/chores Not difficult at all Not difficult at all - - -    PHQ reviewed. Negative  Patient Active Problem List   Diagnosis Date Noted  . Vitamin D deficiency 05/01/2018  . Rectal bleeding   . Gallbladder sludge 05/07/2017  . Elevated LDH 05/01/2017  . Elevated serum glutamic pyruvic transaminase (SGPT) level 05/01/2017  . Nipple crusting 09/10/2016  . Abnormal appearance of cervix 04/29/2016  . Fatigue 04/27/2016  . Preventative health care 04/27/2016  . Laryngopharyngeal  reflux (LPR) 12/25/2015  . Asthma, mild 09/13/2015  . Abdominal bloating 07/29/2015  . Postprandial abdominal bloating 07/29/2015  . Change in stool habits 07/29/2015  . Intestinal gas excretion 07/29/2015  . Abdominal pain, lower 07/29/2015  . Annual physical exam 04/04/2015  . Encounter for screening for malignant neoplasm of cervix 04/04/2015  . Allergic rhinitis 11/22/2014  . Dermatitis, eczematoid 11/22/2014  . H/O abnormal cervical Papanicolaou smear 11/22/2014  . Wrist pain, right 11/22/2014    Past Medical History:  Diagnosis Date  . Allergic asthma without acute exacerbation or status asthmaticus   . Asthma, mild 09/13/2015  . Common ichthyosis   . Eczema   . History of abnormal cervical Pap smear   . Ovarian cyst     Past Surgical History:  Procedure Laterality Date  . FLEXIBLE SIGMOIDOSCOPY  03/24/2018   Procedure: FLEXIBLE SIGMOIDOSCOPY;  Surgeon: Lin Landsman, MD;  Location: [facility] ENDOSCOPY;  Service: Gastroenterology;;  . WISDOM TOOTH EXTRACTION      Social History   Tobacco Use  . Smoking status: Never Smoker  . Smokeless tobacco: Never Used  Substance Use Topics  . Alcohol use: No    Alcohol/week: 0.0 standard drinks     Current Outpatient Medications:  .  albuterol (VENTOLIN HFA) 108 (90 BASE) MCG/ACT inhaler, Inhale 1-2 puffs into the lungs every 6 (six) hours as needed for wheezing or shortness of breath. , Disp: , Rfl:  Allergies  Allergen Reactions  . Amoxicillin Hives and Rash  . Clindamycin/Lincomycin Rash  . Metronidazole Itching, Other (See Comments) and Rash    Mouth swelling  . Penicillins Anaphylaxis and Hives  . Latex     ROS   No other specific complaints in a complete review of systems (except as listed in HPI above).  Objective  Vitals:   10/14/18 1546  Pulse: 74  Resp: 16  Temp: 100 F (37.8 C)     There is no height or weight on file to calculate BMI.  Nursing Note and Vital Signs reviewed.  Physical  Exam  Constitutional: Patient appears well-developed and well-nourished. No distress.  HENT: Head: Normocephalic and atraumatic. Cardiovascular: Normal rate Pulmonary/Chest: Effort normal  Musculoskeletal: Normal range of motion,  Neurological: alert and oriented, speech normal.  Skin: No rash noted. No erythema.  Psychiatric: Patient has a normal mood and affect. behavior is normal. Judgment and thought content normal.    Assessment & Plan  1. Suspected Covid-19 Virus Infection - MYCHART COVID-19 HOME MONITORING PROGRAM - Temperature monitoring; Future  2. Cough Drink plenty of fluids, use steam humidifier to help cough up secretions.   2. Mild intermittent asthma with acute exacerbation Albuterol PRN   3. Nasal congestion neti pot antihistamine.   5. Fever, unspecified fever cause Acetaminophen 500-1,000mg  q 8 hours, not to exceed 3,000mg  in 24 hours.   COVID testing ordered.   Follow Up Instructions:   if fevers are not controlled with acetaminophen and cool compress or develops worsening shortness of breath please get immediate medical attention.   I discussed the assessment and treatment plan with the patient. The patient was provided an opportunity to ask questions and all were answered. The patient agreed with the plan and demonstrated an understanding of the instructions.   The patient was advised to call back or seek an in-person evaluation if the symptoms worsen or if the condition fails to improve as anticipated.  I provided 16 minutes of non-face-to-face time during this encounter.   [name], NP

## 2018-10-14 NOTE — Telephone Encounter (Signed)
Pt reports productive cough, sinus tenderness, fever of 100.8.  Onset this AM.States cough is productive for greenish phlegm.  States has seasonal allergies, was outside all day Sunday, eyes teary, but cough "Like bronchitis" started today. Pt is [redacted]weeks pregnant. States called OB/GYN, told to call PCP if fever developed.  Pt also reports mild body aches, runny nose and "Stuffy ears." .Denies any SOB, no wheezing.  No known exposure, no travel. Attempted to reach practice, line busy. Assured pt [place] would route to practice for review. Advised to go to UC/ED if symptoms worsen, SOB occurs.  Pt verbalizes understanding.  CB# [phone] Reason for Disposition . Earache  Answer Assessment - Initial Assessment Questions 1. ONSET: "When did the cough begin?"      This am 2. SEVERITY: "How bad is the cough today?"     severe 3. RESPIRATORY DISTRESS: "Describe your breathing."      no 4. FEVER: "Do you have a fever?" If so, ask: "What is your temperature, how was it measured, and when did it start?"     100.8 5. SPUTUM: "Describe the color of your sputum" (clear, white, yellow, green)     Greenish and clear 6. HEMOPTYSIS: "Are you coughing up any blood?" If so ask: "How much?" (flecks, streaks, tablespoons, etc.)     no 7. CARDIAC HISTORY: "Do you have any history of heart disease?" (e.g., heart attack, congestive heart failure)      no 8. LUNG HISTORY: "Do you have any history of lung disease?"  (e.g., pulmonary embolus, asthma, emphysema)     allergies 9. PE RISK FACTORS: "Do you have a history of blood clots?" (or: recent major surgery, recent prolonged travel, bedridden)     *No Answer* 10. OTHER SYMPTOMS: "Do you have any other symptoms?" (e.g., runny nose, wheezing, chest pain)       Ears stuffy, headache, runny nose, sinus tenderness, body aches 11. PREGNANCY: "Is there any chance you are pregnant?" "When was your last menstrual period?"       [redacted]  weeks pregnant  Protocols used: [place]

## 2018-10-14 NOTE — Telephone Encounter (Signed)
Recommend COVID19 testing Patient has fevers, cough, shortness of breath and nasal congestion Commodities: asthma, pregnancy    Pt scheduled for covid testing tomorrow at [facility] site .Requested by Edgardo Roys at [facility] practice  Testing process reviewed, stay in car, wear mask as well as other passengers in car. Pt verbalizes understanding.

## 2018-10-14 NOTE — Telephone Encounter (Signed)
Recommend COVID19 testing Patient has fevers, cough, shortness of breath and nasal congestion Commodities: asthma, pregnancy

## 2018-10-15 ENCOUNTER — Other Ambulatory Visit: Payer: 59

## 2018-10-15 ENCOUNTER — Ambulatory Visit: Payer: 59 | Admitting: Family Medicine

## 2018-10-15 DIAGNOSIS — Z20822 Contact with and (suspected) exposure to covid-19: Secondary | ICD-10-CM

## 2018-10-19 LAB — NOVEL CORONAVIRUS, NAA: SARS-CoV-2, NAA: NOT DETECTED

## 2018-11-06 LAB — OB RESULTS CONSOLE ABO/RH: RH Type: NEGATIVE

## 2018-11-06 LAB — OB RESULTS CONSOLE ANTIBODY SCREEN: Antibody Screen: POSITIVE

## 2019-01-10 ENCOUNTER — Other Ambulatory Visit: Payer: Self-pay

## 2019-01-10 ENCOUNTER — Inpatient Hospital Stay (HOSPITAL_COMMUNITY)
Admission: AD | Admit: 2019-01-10 | Discharge: 2019-01-10 | Disposition: A | Discharge status: Home / Self Care | Payer: 59 | Attending: Obstetrics and Gynecology | Admitting: Obstetrics and Gynecology

## 2019-01-10 ENCOUNTER — Encounter (HOSPITAL_COMMUNITY): Payer: Self-pay

## 2019-01-10 DIAGNOSIS — J45909 Unspecified asthma, uncomplicated: Secondary | ICD-10-CM | POA: Diagnosis not present

## 2019-01-10 DIAGNOSIS — O26893 Other specified pregnancy related conditions, third trimester: Secondary | ICD-10-CM | POA: Diagnosis not present

## 2019-01-10 DIAGNOSIS — Z91040 Latex allergy status: Secondary | ICD-10-CM | POA: Diagnosis not present

## 2019-01-10 DIAGNOSIS — Z8349 Family history of other endocrine, nutritional and metabolic diseases: Secondary | ICD-10-CM | POA: Insufficient documentation

## 2019-01-10 DIAGNOSIS — Z825 Family history of asthma and other chronic lower respiratory diseases: Secondary | ICD-10-CM | POA: Insufficient documentation

## 2019-01-10 DIAGNOSIS — Z3689 Encounter for other specified antenatal screening: Secondary | ICD-10-CM

## 2019-01-10 DIAGNOSIS — R109 Unspecified abdominal pain: Secondary | ICD-10-CM

## 2019-01-10 DIAGNOSIS — Z8249 Family history of ischemic heart disease and other diseases of the circulatory system: Secondary | ICD-10-CM | POA: Diagnosis not present

## 2019-01-10 DIAGNOSIS — Z79899 Other long term (current) drug therapy: Secondary | ICD-10-CM | POA: Diagnosis not present

## 2019-01-10 DIAGNOSIS — O99513 Diseases of the respiratory system complicating pregnancy, third trimester: Secondary | ICD-10-CM | POA: Diagnosis not present

## 2019-01-10 DIAGNOSIS — R1031 Right lower quadrant pain: Secondary | ICD-10-CM | POA: Diagnosis not present

## 2019-01-10 DIAGNOSIS — Z880 Allergy status to penicillin: Secondary | ICD-10-CM | POA: Diagnosis not present

## 2019-01-10 DIAGNOSIS — Z3A36 36 weeks gestation of pregnancy: Secondary | ICD-10-CM | POA: Insufficient documentation

## 2019-01-10 DIAGNOSIS — Z881 Allergy status to other antibiotic agents status: Secondary | ICD-10-CM | POA: Insufficient documentation

## 2019-01-10 DIAGNOSIS — O26899 Other specified pregnancy related conditions, unspecified trimester: Secondary | ICD-10-CM

## 2019-01-10 LAB — CBC
HCT: 34.1 % — ABNORMAL LOW (ref 36.0–46.0)
Hemoglobin: 11.9 g/dL — ABNORMAL LOW (ref 12.0–15.0)
MCH: 30.9 pg (ref 26.0–34.0)
MCHC: 34.9 g/dL (ref 30.0–36.0)
MCV: 88.6 fL (ref 80.0–100.0)
Platelets: 207 10*3/uL (ref 150–400)
RBC: 3.85 MIL/uL — ABNORMAL LOW (ref 3.87–5.11)
RDW: 12.8 % (ref 11.5–15.5)
WBC: 9.9 10*3/uL (ref 4.0–10.5)
nRBC: 0 % (ref 0.0–0.2)

## 2019-01-10 LAB — COMPREHENSIVE METABOLIC PANEL
ALT: 15 U/L (ref 0–44)
AST: 16 U/L (ref 15–41)
Albumin: 2.8 g/dL — ABNORMAL LOW (ref 3.5–5.0)
Alkaline Phosphatase: 104 U/L (ref 38–126)
Anion gap: 9 (ref 5–15)
BUN: 6 mg/dL (ref 6–20)
CO2: 22 mmol/L (ref 22–32)
Calcium: 8.6 mg/dL — ABNORMAL LOW (ref 8.9–10.3)
Chloride: 105 mmol/L (ref 98–111)
Creatinine, Ser: 0.51 mg/dL (ref 0.44–1.00)
GFR calc Af Amer: >60 mL/min (ref >60)
GFR calc non Af Amer: >60 mL/min (ref >60)
Glucose, Bld: 96 mg/dL (ref 70–99)
Potassium: 3.6 mmol/L (ref 3.5–5.1)
Sodium: 136 mmol/L (ref 135–145)
Total Bilirubin: 0.5 mg/dL (ref 0.3–1.2)
Total Protein: 5.5 g/dL — ABNORMAL LOW (ref 6.5–8.1)

## 2019-01-10 LAB — URINALYSIS, ROUTINE W REFLEX MICROSCOPIC
Bilirubin Urine: NEGATIVE
Glucose, UA: NEGATIVE mg/dL
Hgb urine dipstick: NEGATIVE
Ketones, ur: NEGATIVE mg/dL
Leukocytes,Ua: NEGATIVE
Nitrite: NEGATIVE
Protein, ur: NEGATIVE mg/dL
Specific Gravity, Urine: 1.018 (ref 1.005–1.030)
pH: 7 (ref 5.0–8.0)

## 2019-01-10 LAB — LIPASE, BLOOD: Lipase: 41 U/L (ref 11–51)

## 2019-01-10 MED ORDER — ACETAMINOPHEN 500 MG PO TABS
1000.0000 mg | ORAL_TABLET | Freq: Four times a day (QID) | ORAL | Status: DC | PRN
  Administered 2019-01-10: 1000 mg via ORAL
  Filled 2019-01-10: qty 2

## 2019-01-10 MED ORDER — CYCLOBENZAPRINE HCL 10 MG PO TABS
10.0000 mg | ORAL_TABLET | Freq: Three times a day (TID) | ORAL | Status: DC | PRN
  Administered 2019-01-10: 10 mg via ORAL
  Filled 2019-01-10: qty 1

## 2019-01-10 NOTE — MAU Note (Signed)
Pt c/o RLQ pain all day on/off; increasing in intensity x 2 hrs ago. Pain is 7/10, sharp shooting and constant.  No vag bldg or LOF. Pos FM.  Gilmer Mor RN

## 2019-01-10 NOTE — MAU Provider Note (Signed)
History     CSN: [id number]  Arrival date and time: 01/10/19 2109   First Provider Initiated Contact with Patient 01/10/19 2207      Chief Complaint  Patient presents with  . Abdominal Pain    RLQ   28 y.o. G1 @36 .2 wks presenting with abd pain. Pain started earlier today. Describes as sharp, initially intermittent but now constant and just right of her umbilicus. Movement makes the pain worse. Rates 7/10. Has not tried anything for it. Reports good FM. No VB, LOF, or ctx. Some intermittent nausea today but no emesis. Recent BM. Denies fevers. Reports hx of GERD and uses TUMS. Also reports LUQ pain that started after arrival to MAU.   OB History    Gravida  1   Para  0   Term  0   Preterm  0   AB  0   Living  0     SAB  0   TAB  0   Ectopic  0   Multiple  0   Live Births  0        Obstetric Comments  1st Menstrual Cycle:  11           Past Medical History:  Diagnosis Date  . Allergic asthma without acute exacerbation or status asthmaticus   . Asthma, mild 09/13/2015  . Common ichthyosis   . Eczema   . H/O abnormal cervical Papanicolaou smear 11/22/2014   Repeat PAP on 04/04/15 normal. [facility] saw patient for LGSIL PAP with normal Colpo exam with biopsy in 2015 - 2016   . History of abnormal cervical Pap smear   . Ovarian cyst     Past Surgical History:  Procedure Laterality Date  . FLEXIBLE SIGMOIDOSCOPY  03/24/2018   Procedure: FLEXIBLE SIGMOIDOSCOPY;  Surgeon: Lin Landsman, MD;  Location: [facility] ENDOSCOPY;  Service: Gastroenterology;;  . WISDOM TOOTH EXTRACTION      Family History  Problem Relation Age of Onset  . Asthma Mother   . Hypothyroidism Mother   . Osteoporosis Maternal Grandmother   . Heart attack Maternal Grandfather   . Heart disease Maternal Grandfather   . Cancer Paternal Grandfather        lung  . Breast cancer Neg Hx   . Ovarian cancer Neg Hx     Social History   Tobacco Use  . Smoking status: Never  Smoker  . Smokeless tobacco: Never Used  Substance Use Topics  . Alcohol use: No    Alcohol/week: 0.0 standard drinks  . Drug use: No    Allergies:  Allergies  Allergen Reactions  . Amoxicillin Hives and Rash  . Clindamycin/Lincomycin Rash  . Metronidazole Itching, Other (See Comments) and Rash    Mouth swelling  . Penicillins Anaphylaxis and Hives  . Latex     Medications Prior to Admission  Medication Sig Dispense Refill Last Dose  . calcium-vitamin D (OSCAL WITH D) 500-200 MG-UNIT tablet Take 1 tablet by mouth.     . Prenatal Vit-Fe Fumarate-FA (MULTIVITAMIN-PRENATAL) 27-0.8 MG TABS tablet Take 1 tablet by mouth daily at 12 noon.     Marland Kitchen albuterol (VENTOLIN HFA) 108 (90 BASE) MCG/ACT inhaler Inhale 1-2 puffs into the lungs every 6 (six) hours as needed for wheezing or shortness of breath.        Review of Systems  Constitutional: Negative for chills and fever.  Gastrointestinal: Positive for abdominal pain and nausea. Negative for constipation, diarrhea and vomiting.  Genitourinary: Negative for dysuria,  frequency, hematuria, urgency, vaginal bleeding and vaginal discharge.  Musculoskeletal: Positive for back pain.   Physical Exam   Blood pressure 100/66, pulse (!) 102, temperature 98 F (36.7 C), temperature source Oral, resp. rate 18, last menstrual period 04/28/2018, SpO2 100 %.  Physical Exam  Nursing note and vitals reviewed. Constitutional: She is oriented to person, place, and time. She appears well-developed and well-nourished. No distress.  HENT:  Head: Normocephalic and atraumatic.  Neck: Normal range of motion.  Cardiovascular: Normal rate.  Respiratory: Effort normal. No respiratory distress.  GI: Soft. She exhibits no distension. There is no abdominal tenderness. There is no rebound, no guarding and no CVA tenderness.  gravid  Genitourinary:    Genitourinary Comments: VE: closed/long   Musculoskeletal: Normal range of motion.     Cervical back: Normal.      Thoracic back: Normal.     Lumbar back: Normal.  Neurological: She is alert and oriented to person, place, and time.  Skin: Skin is warm and dry.  Psychiatric: She has a normal mood and affect.  EFM: 125 bpm, mod variability, + accels, no decels Toco: irregular  Results for orders placed or performed during the hospital encounter of 01/10/19 (from the past 24 hour(s))  Urinalysis, Routine w reflex microscopic     Status: Abnormal   Collection Time: 01/10/19  9:42 PM  Result Value Ref Range   Color, Urine YELLOW YELLOW   APPearance CLOUDY (A) CLEAR   Specific Gravity, Urine 1.018 1.005 - 1.030   pH 7.0 5.0 - 8.0   Glucose, UA NEGATIVE NEGATIVE mg/dL   Hgb urine dipstick NEGATIVE NEGATIVE   Bilirubin Urine NEGATIVE NEGATIVE   Ketones, ur NEGATIVE NEGATIVE mg/dL   Protein, ur NEGATIVE NEGATIVE mg/dL   Nitrite NEGATIVE NEGATIVE   Leukocytes,Ua NEGATIVE NEGATIVE  CBC     Status: Abnormal   Collection Time: 01/10/19 10:37 PM  Result Value Ref Range   WBC 9.9 4.0 - 10.5 K/uL   RBC 3.85 (L) 3.87 - 5.11 MIL/uL   Hemoglobin 11.9 (L) 12.0 - 15.0 g/dL   HCT 34.1 (L) 36.0 - 46.0 %   MCV 88.6 80.0 - 100.0 fL   MCH 30.9 26.0 - 34.0 pg   MCHC 34.9 30.0 - 36.0 g/dL   RDW 12.8 11.5 - 15.5 %   Platelets 207 150 - 400 K/uL   nRBC 0.0 0.0 - 0.2 %  Comprehensive metabolic panel     Status: Abnormal   Collection Time: 01/10/19 10:37 PM  Result Value Ref Range   Sodium 136 135 - 145 mmol/L   Potassium 3.6 3.5 - 5.1 mmol/L   Chloride 105 98 - 111 mmol/L   CO2 22 22 - 32 mmol/L   Glucose, Bld 96 70 - 99 mg/dL   BUN 6 6 - 20 mg/dL   Creatinine, Ser 0.51 0.44 - 1.00 mg/dL   Calcium 8.6 (L) 8.9 - 10.3 mg/dL   Total Protein 5.5 (L) 6.5 - 8.1 g/dL   Albumin 2.8 (L) 3.5 - 5.0 g/dL   AST 16 15 - 41 U/L   ALT 15 0 - 44 U/L   Alkaline Phosphatase 104 38 - 126 U/L   Total Bilirubin 0.5 0.3 - 1.2 mg/dL   GFR calc non Af Amer >60 >60 mL/min   GFR calc Af Amer >60 >60 mL/min   Anion gap 9 5 - 15   Lipase, blood     Status: None   Collection Time: 01/10/19 10:37 PM  Result Value Ref Range   Lipase 41 11 - 51 U/L   MAU Course  Procedures Tylenol  MDM Labs ordered and reviewed. Labs normal. Pain improved with meds. No evidence of acute abd process, PTL, abruption. Pain likely MSK or fetal position. Stable for discharge home.   Assessment and Plan   1. [redacted] weeks gestation of pregnancy   2. NST (non-stress test) reactive   3. Abdominal pain affecting pregnancy    Discharge home Follow up at [facility] next week as scheduled Return precautions  Allergies as of 01/10/2019      Reactions   Amoxicillin Hives, Rash   Clindamycin/lincomycin Rash   Metronidazole Itching, Other (See Comments), Rash   Mouth swelling   Penicillins Anaphylaxis, Hives   Latex       Medication List    TAKE these medications   calcium-vitamin D 500-200 MG-UNIT tablet Commonly known as: OSCAL WITH D Take 1 tablet by mouth.   multivitamin-prenatal 27-0.8 MG Tabs tablet Take 1 tablet by mouth daily at 12 noon.   Ventolin HFA 108 (90 Base) MCG/ACT inhaler Generic drug: albuterol Inhale 1-2 puffs into the lungs every 6 (six) hours as needed for wheezing or shortness of breath.      Julianne Handler, CNM 01/10/2019, 11:35 PM

## 2019-01-10 NOTE — Discharge Instructions (Signed)
Abdominal Pain During Pregnancy ° °Abdominal pain is common during pregnancy, and has many possible causes. Some causes are more serious than others, and sometimes the cause is not known. Abdominal pain can be a sign that labor is starting. It can also be caused by normal growth and stretching of muscles and ligaments during pregnancy. Always tell your health care provider if you have any abdominal pain. °Follow these instructions at home: °· Do not have sex or put anything in your vagina until your pain goes away completely. °· Get plenty of rest until your pain improves. °· Drink enough fluid to keep your urine pale yellow. °· Take over-the-counter and prescription medicines only as told by your health care provider. °· Keep all follow-up visits as told by your health care provider. This is important. °Contact a health care provider if: °· Your pain continues or gets worse after resting. °· You have lower abdominal pain that: °? Comes and goes at regular intervals. °? Spreads to your back. °? Is similar to menstrual cramps. °· You have pain or burning when you urinate. °Get help right away if: °· You have a fever or chills. °· You have vaginal bleeding. °· You are leaking fluid from your vagina. °· You are passing tissue from your vagina. °· You have vomiting or diarrhea that lasts for more than 24 hours. °· Your baby is moving less than usual. °· You feel very weak or faint. °· You have shortness of breath. °· You develop severe pain in your upper abdomen. °Summary °· Abdominal pain is common during pregnancy, and has many possible causes. °· If you experience abdominal pain during pregnancy, tell your health care provider right away. °· Follow your health care provider's home care instructions and keep all follow-up visits as directed. °This information is not intended to replace advice given to you by your health care provider. Make sure you discuss any questions you have with your health care  provider. °Document Released: 04/09/2005 Document Revised: 07/28/2018 Document Reviewed: 07/12/2016 °Elsevier Patient Education © 2020 [employer] Inc. ° °

## 2019-01-30 ENCOUNTER — Encounter (HOSPITAL_COMMUNITY): Payer: Self-pay | Admitting: *Deleted

## 2019-01-30 ENCOUNTER — Telehealth (HOSPITAL_COMMUNITY): Payer: Self-pay | Admitting: *Deleted

## 2019-01-30 NOTE — Telephone Encounter (Signed)
Preadmission screen  

## 2019-02-04 ENCOUNTER — Other Ambulatory Visit (HOSPITAL_COMMUNITY)
Admission: RE | Admit: 2019-02-04 | Discharge: 2019-02-04 | Disposition: A | Discharge status: Home / Self Care | Payer: 59 | Source: Ambulatory Visit | Attending: Obstetrics and Gynecology | Admitting: Obstetrics and Gynecology

## 2019-02-04 ENCOUNTER — Other Ambulatory Visit: Payer: Self-pay

## 2019-02-04 LAB — SARS CORONAVIRUS 2 (TAT 6-24 HRS): SARS Coronavirus 2: NEGATIVE

## 2019-02-04 NOTE — MAU Note (Signed)
Asymptomatic, swab collected. 

## 2019-02-06 ENCOUNTER — Other Ambulatory Visit: Payer: Self-pay

## 2019-02-06 ENCOUNTER — Inpatient Hospital Stay (HOSPITAL_COMMUNITY)
Admission: RE | Admit: 2019-02-06 | Discharge: 2019-02-08 | DRG: 768 | Disposition: A | Discharge status: Home / Self Care | Payer: 59 | Attending: Obstetrics and Gynecology | Admitting: Obstetrics and Gynecology

## 2019-02-06 ENCOUNTER — Encounter (HOSPITAL_COMMUNITY): Payer: Self-pay

## 2019-02-06 ENCOUNTER — Inpatient Hospital Stay (HOSPITAL_COMMUNITY): Payer: 59 | Admitting: Anesthesiology

## 2019-02-06 ENCOUNTER — Inpatient Hospital Stay (HOSPITAL_COMMUNITY): Payer: 59

## 2019-02-06 DIAGNOSIS — Z91040 Latex allergy status: Secondary | ICD-10-CM

## 2019-02-06 DIAGNOSIS — Z23 Encounter for immunization: Secondary | ICD-10-CM | POA: Diagnosis not present

## 2019-02-06 DIAGNOSIS — Z3490 Encounter for supervision of normal pregnancy, unspecified, unspecified trimester: Secondary | ICD-10-CM

## 2019-02-06 DIAGNOSIS — O480 Post-term pregnancy: Secondary | ICD-10-CM | POA: Diagnosis not present

## 2019-02-06 DIAGNOSIS — O99824 Streptococcus B carrier state complicating childbirth: Secondary | ICD-10-CM | POA: Diagnosis not present

## 2019-02-06 DIAGNOSIS — Z20828 Contact with and (suspected) exposure to other viral communicable diseases: Secondary | ICD-10-CM | POA: Diagnosis not present

## 2019-02-06 DIAGNOSIS — J45909 Unspecified asthma, uncomplicated: Secondary | ICD-10-CM | POA: Diagnosis not present

## 2019-02-06 DIAGNOSIS — O9952 Diseases of the respiratory system complicating childbirth: Secondary | ICD-10-CM | POA: Diagnosis not present

## 2019-02-06 DIAGNOSIS — Z3A40 40 weeks gestation of pregnancy: Secondary | ICD-10-CM

## 2019-02-06 LAB — CBC
HCT: 38.4 % (ref 36.0–46.0)
Hemoglobin: 13.4 g/dL (ref 12.0–15.0)
MCH: 30.7 pg (ref 26.0–34.0)
MCHC: 34.9 g/dL (ref 30.0–36.0)
MCV: 88.1 fL (ref 80.0–100.0)
Platelets: 221 10*3/uL (ref 150–400)
RBC: 4.36 MIL/uL (ref 3.87–5.11)
RDW: 13 % (ref 11.5–15.5)
WBC: 10.6 10*3/uL — ABNORMAL HIGH (ref 4.0–10.5)
nRBC: 0 % (ref 0.0–0.2)

## 2019-02-06 LAB — RPR: RPR Ser Ql: NONREACTIVE

## 2019-02-06 MED ORDER — PHENYLEPHRINE 40 MCG/ML (10ML) SYRINGE FOR IV PUSH (FOR BLOOD PRESSURE SUPPORT): 80.0000 ug | PREFILLED_SYRINGE | INTRAVENOUS | Status: DC | PRN

## 2019-02-06 MED ORDER — MEDROXYPROGESTERONE ACETATE 150 MG/ML IM SUSP: 150.0000 mg | INTRAMUSCULAR | Status: DC | PRN

## 2019-02-06 MED ORDER — OXYTOCIN 40 UNITS IN NORMAL SALINE INFUSION - SIMPLE MED
2.5000 [IU]/h | INTRAVENOUS | Status: DC
  Filled 2019-02-06: qty 1000

## 2019-02-06 MED ORDER — ACETAMINOPHEN 325 MG PO TABS
650.0000 mg | ORAL_TABLET | ORAL | Status: DC | PRN
  Administered 2019-02-06 – 2019-02-08 (×2): 650 mg via ORAL
  Filled 2019-02-06 (×2): qty 2

## 2019-02-06 MED ORDER — INFLUENZA VAC SPLIT QUAD 0.5 ML IM SUSY
0.5000 mL | PREFILLED_SYRINGE | INTRAMUSCULAR | Status: AC
  Administered 2019-02-08: 0.5 mL via INTRAMUSCULAR
  Filled 2019-02-06: qty 0.5

## 2019-02-06 MED ORDER — FENTANYL-BUPIVACAINE-NACL 0.5-0.125-0.9 MG/250ML-% EP SOLN
12.0000 mL/h | EPIDURAL | Status: DC | PRN
  Filled 2019-02-06: qty 250

## 2019-02-06 MED ORDER — DIPHENHYDRAMINE HCL 25 MG PO CAPS: 25.0000 mg | ORAL_CAPSULE | Freq: Four times a day (QID) | ORAL | Status: DC | PRN

## 2019-02-06 MED ORDER — DIBUCAINE (PERIANAL) 1 % EX OINT: 1.0000 "application " | TOPICAL_OINTMENT | CUTANEOUS | Status: DC | PRN

## 2019-02-06 MED ORDER — MEASLES, MUMPS & RUBELLA VAC IJ SOLR: 0.5000 mL | Freq: Once | INTRAMUSCULAR | Status: DC

## 2019-02-06 MED ORDER — ACETAMINOPHEN 325 MG PO TABS
650.0000 mg | ORAL_TABLET | ORAL | Status: DC | PRN
  Administered 2019-02-06: 650 mg via ORAL
  Filled 2019-02-06: qty 2

## 2019-02-06 MED ORDER — ONDANSETRON HCL 4 MG PO TABS: 4.0000 mg | ORAL_TABLET | ORAL | Status: DC | PRN

## 2019-02-06 MED ORDER — EPHEDRINE 5 MG/ML INJ: 10.0000 mg | INTRAVENOUS | Status: DC | PRN

## 2019-02-06 MED ORDER — OXYCODONE-ACETAMINOPHEN 5-325 MG PO TABS: 2.0000 | ORAL_TABLET | ORAL | Status: DC | PRN

## 2019-02-06 MED ORDER — OXYCODONE-ACETAMINOPHEN 5-325 MG PO TABS: 1.0000 | ORAL_TABLET | ORAL | Status: DC | PRN

## 2019-02-06 MED ORDER — BUTORPHANOL TARTRATE 1 MG/ML IJ SOLN
1.0000 mg | INTRAMUSCULAR | Status: DC | PRN
  Administered 2019-02-06: 07:00:00 1 mg via INTRAVENOUS
  Filled 2019-02-06: qty 1

## 2019-02-06 MED ORDER — PRENATAL MULTIVITAMIN CH
1.0000 | ORAL_TABLET | Freq: Every day | ORAL | Status: DC
  Administered 2019-02-07: 1 via ORAL
  Filled 2019-02-06: qty 1

## 2019-02-06 MED ORDER — ONDANSETRON HCL 4 MG/2ML IJ SOLN: 4.0000 mg | Freq: Four times a day (QID) | INTRAMUSCULAR | Status: DC | PRN

## 2019-02-06 MED ORDER — SENNOSIDES-DOCUSATE SODIUM 8.6-50 MG PO TABS
2.0000 | ORAL_TABLET | ORAL | Status: DC
  Administered 2019-02-06 – 2019-02-08 (×2): 2 via ORAL
  Filled 2019-02-06 (×2): qty 2

## 2019-02-06 MED ORDER — LIDOCAINE HCL (PF) 1 % IJ SOLN
INTRAMUSCULAR | Status: DC | PRN
  Administered 2019-02-06: 4 mL via EPIDURAL
  Administered 2019-02-06: 5 mL via EPIDURAL

## 2019-02-06 MED ORDER — LACTATED RINGERS IV SOLN: 500.0000 mL | Freq: Once | INTRAVENOUS | Status: DC

## 2019-02-06 MED ORDER — TETANUS-DIPHTH-ACELL PERTUSSIS 5-2.5-18.5 LF-MCG/0.5 IM SUSP: 0.5000 mL | Freq: Once | INTRAMUSCULAR | Status: DC

## 2019-02-06 MED ORDER — OXYTOCIN BOLUS FROM INFUSION
500.0000 mL | Freq: Once | INTRAVENOUS | Status: AC
  Administered 2019-02-06: 14:00:00 500 mL via INTRAVENOUS

## 2019-02-06 MED ORDER — SOD CITRATE-CITRIC ACID 500-334 MG/5ML PO SOLN: 30.0000 mL | ORAL | Status: DC | PRN

## 2019-02-06 MED ORDER — LACTATED RINGERS IV SOLN
INTRAVENOUS | Status: DC
  Administered 2019-02-06 (×2): via INTRAVENOUS

## 2019-02-06 MED ORDER — BENZOCAINE-MENTHOL 20-0.5 % EX AERO
1.0000 "application " | INHALATION_SPRAY | CUTANEOUS | Status: DC | PRN
  Administered 2019-02-06: 1 via TOPICAL
  Filled 2019-02-06: qty 56

## 2019-02-06 MED ORDER — LIDOCAINE HCL (PF) 1 % IJ SOLN: 30.0000 mL | INTRAMUSCULAR | Status: DC | PRN

## 2019-02-06 MED ORDER — MISOPROSTOL 25 MCG QUARTER TABLET
25.0000 ug | ORAL_TABLET | ORAL | Status: DC | PRN
  Administered 2019-02-06 (×2): 25 ug via VAGINAL
  Filled 2019-02-06 (×2): qty 1

## 2019-02-06 MED ORDER — ALBUTEROL SULFATE (2.5 MG/3ML) 0.083% IN NEBU: 2.5000 mg | INHALATION_SOLUTION | Freq: Four times a day (QID) | RESPIRATORY_TRACT | Status: DC | PRN

## 2019-02-06 MED ORDER — WITCH HAZEL-GLYCERIN EX PADS: 1.0000 "application " | MEDICATED_PAD | CUTANEOUS | Status: DC | PRN

## 2019-02-06 MED ORDER — SODIUM CHLORIDE (PF) 0.9 % IJ SOLN
INTRAMUSCULAR | Status: DC | PRN
  Administered 2019-02-06: 12 mL/h via EPIDURAL

## 2019-02-06 MED ORDER — VANCOMYCIN HCL IN DEXTROSE 1-5 GM/200ML-% IV SOLN
1000.0000 mg | Freq: Three times a day (TID) | INTRAVENOUS | Status: DC
  Administered 2019-02-06 (×2): 1000 mg via INTRAVENOUS
  Filled 2019-02-06 (×3): qty 200

## 2019-02-06 MED ORDER — ONDANSETRON HCL 4 MG/2ML IJ SOLN: 4.0000 mg | INTRAMUSCULAR | Status: DC | PRN

## 2019-02-06 MED ORDER — LACTATED RINGERS IV SOLN: 500.0000 mL | INTRAVENOUS | Status: DC | PRN

## 2019-02-06 MED ORDER — COCONUT OIL OIL
1.0000 "application " | TOPICAL_OIL | Status: DC | PRN
  Administered 2019-02-06: 1 via TOPICAL

## 2019-02-06 MED ORDER — ERYTHROMYCIN 5 MG/GM OP OINT
TOPICAL_OINTMENT | OPHTHALMIC | Status: AC
  Filled 2019-02-06: qty 1

## 2019-02-06 MED ORDER — IBUPROFEN 600 MG PO TABS
600.0000 mg | ORAL_TABLET | Freq: Four times a day (QID) | ORAL | Status: DC
  Administered 2019-02-06 – 2019-02-08 (×8): 600 mg via ORAL
  Filled 2019-02-06 (×8): qty 1

## 2019-02-06 MED ORDER — ZOLPIDEM TARTRATE 5 MG PO TABS: 5.0000 mg | ORAL_TABLET | Freq: Every evening | ORAL | Status: DC | PRN

## 2019-02-06 MED ORDER — TERBUTALINE SULFATE 1 MG/ML IJ SOLN: 0.2500 mg | Freq: Once | INTRAMUSCULAR | Status: DC | PRN

## 2019-02-06 MED ORDER — DIPHENHYDRAMINE HCL 50 MG/ML IJ SOLN: 12.5000 mg | INTRAMUSCULAR | Status: DC | PRN

## 2019-02-06 MED ORDER — SIMETHICONE 80 MG PO CHEW: 80.0000 mg | CHEWABLE_TABLET | ORAL | Status: DC | PRN

## 2019-02-06 NOTE — H&P (Signed)
Hayley Hunt is a 28 y.o. female presenting for IOL due to postdates.  Pregnancy uncomplicated  GBS +. OB History    Gravida  1   Para  0   Term  0   Preterm  0   AB  0   Living  0     SAB  0   TAB  0   Ectopic  0   Multiple  0   Live Births  0        Obstetric Comments  1st Menstrual Cycle:  11          Past Medical History:  Diagnosis Date  . Allergic asthma without acute exacerbation or status asthmaticus   . Asthma, mild 09/13/2015  . Common ichthyosis   . Eczema   . GERD (gastroesophageal reflux disease)   . H/O abnormal cervical Papanicolaou smear 11/22/2014   Repeat PAP on 04/04/15 normal. [facility] saw patient for LGSIL PAP with normal Colpo exam with biopsy in 2015 - 2016   . History of abnormal cervical Pap smear   . Ovarian cyst   . Vitamin D deficiency    Past Surgical History:  Procedure Laterality Date  . FLEXIBLE SIGMOIDOSCOPY  03/24/2018   Procedure: FLEXIBLE SIGMOIDOSCOPY;  Surgeon: Lin Landsman, MD;  Location: [facility] ENDOSCOPY;  Service: Gastroenterology;;  . WISDOM TOOTH EXTRACTION     Family History: family history includes Asthma in her mother; Cancer in her paternal grandfather; Heart attack in her maternal grandfather; Heart disease in her maternal grandfather; Hypothyroidism in her mother; Lupus in her mother; Osteoporosis in her maternal grandmother. Social History:  reports that she has never smoked. She has never used smokeless tobacco. She reports that she does not drink alcohol or use drugs.     Maternal Diabetes: No Genetic Screening: Normal Maternal Ultrasounds/Referrals: Normal Fetal Ultrasounds or other Referrals:  None Maternal Substance Abuse:  No Significant Maternal Medications:  None Significant Maternal Lab Results:  Group B Strep positive Other Comments:  None  ROS History Dilation: 3 Effacement (%): 90 Station: -2 Exam by:: Hector Shade, RN Blood pressure 110/77, pulse 67, temperature 97.8 F  (36.6 C), temperature source Oral, resp. rate 18, height 5' (1.524 m), weight 64.5 kg, last menstrual period 04/28/2018, SpO2 100 %. Exam Physical Exam  Prenatal labs: ABO, Rh: --/--/A NEG (10/16 0022) Antibody: POS (10/16 0022) Rubella: Immune (03/11 0000) RPR: Nonreactive (03/11 0000)  HBsAg: Negative (03/11 0000)  HIV: Non-reactive (03/11 0000)  GBS: Positive/-- (03/11 0000)   Assessment/Plan: IUP at post term Cyctotec now with AROM GBS + on Vanc Anticipate SVD   Luz Lex 02/06/2019, 9:17 AM

## 2019-02-06 NOTE — Anesthesia Preprocedure Evaluation (Signed)
Anesthesia Evaluation  Patient identified by MRN, date of birth, ID band Patient awake    Reviewed: Allergy & Precautions, NPO status , Patient's Chart, lab work & pertinent test results  History of Anesthesia Complications Negative for: history of anesthetic complications  Airway Mallampati: II   Neck ROM: Full    Dental   Pulmonary asthma ,    Pulmonary exam normal        Cardiovascular negative cardio ROS Normal cardiovascular exam     Neuro/Psych negative neurological ROS  negative psych ROS   GI/Hepatic Neg liver ROS, GERD  ,  Endo/Other  negative endocrine ROS  Renal/GU negative Renal ROS     Musculoskeletal negative musculoskeletal ROS (+)   Abdominal   Peds  Hematology negative hematology ROS (+)  Plt 221k    Anesthesia Other Findings   Reproductive/Obstetrics (+) Pregnancy                             Anesthesia Physical Anesthesia Plan  ASA: II  Anesthesia Plan: Epidural   Post-op Pain Management:    Induction:   PONV Risk Score and Plan: 2 and Treatment may vary due to age or medical condition  Airway Management Planned: Natural Airway  Additional Equipment: None  Intra-op Plan:   Post-operative Plan:   Informed Consent: I have reviewed the patients History and Physical, chart, labs and discussed the procedure including the risks, benefits and alternatives for the proposed anesthesia with the patient or authorized representative who has indicated his/her understanding and acceptance.       Plan Discussed with: Anesthesiologist  Anesthesia Plan Comments: (Labs reviewed. Platelets acceptable, patient not taking any blood thinning medications. Per RN, FHR tracing reported to be stable enough for sitting procedure. Risks and benefits discussed with patient, including PDPH, backache, epidural hematoma, failed epidural, blood pressure changes, allergic reaction,  and nerve injury. Patient expressed understanding and wished to proceed.)        Anesthesia Quick Evaluation

## 2019-02-06 NOTE — Anesthesia Procedure Notes (Signed)
Epidural Patient location during procedure: OB Start time: 02/06/2019 8:28 AM End time: 02/06/2019 8:32 AM  Staffing Anesthesiologist: Audry Pili, MD Performed: anesthesiologist   Preanesthetic Checklist Completed: patient identified, pre-op evaluation, timeout performed, IV checked, risks and benefits discussed and monitors and equipment checked  Epidural Patient position: sitting Prep: DuraPrep Patient monitoring: continuous pulse ox and blood pressure Approach: midline Location: L2-L3 Injection technique: LOR saline  Needle:  Needle type: Tuohy  Needle gauge: 17 G Needle length: 9 cm Needle insertion depth: 4 cm Catheter size: 19 Gauge Catheter at skin depth: 9 cm Test dose: negative and Other (1% lidocaine)  Assessment Events: blood not aspirated  Additional Notes Patient identified. Risks including, but not limited to, bleeding, infection, nerve damage, paralysis, inadequate analgesia, blood pressure changes, nausea, vomiting, allergic reaction, postpartum back pain, itching, and headache were discussed. Patient expressed understanding and wished to proceed. Sterile prep and drape, including hand hygiene, mask, and sterile gloves were used. The patient was positioned and the spine was prepped. The skin was anesthetized with lidocaine. No paraesthesia or other complication noted. The patient did not experience any signs of intravascular injection such as tinnitus or metallic taste in mouth, nor signs of intrathecal spread such as rapid motor block. Please see nursing notes for vital signs. The patient tolerated the procedure well.   Renold Don, MDReason for block:procedure for pain

## 2019-02-06 NOTE — Lactation Note (Signed)
This note was copied from a baby's chart. Lactation Consultation Note  Patient Name: Hayley Hunt [id number] Date: 02/06/2019 Reason for consult: Initial assessment;Term;Primapara;1st time breastfeeding  P1 mother whose infant is now 55 hours old.    RN requested latch assistance.    Mother was in the bathroom when I arrived.  RN assisting.  Allowed mother time to position herself appropriately in the bed prior to latching.  Baby was awake and alert.  Mother's breasts are soft and non tender and nipples are everted and intact.  Taught mother hand expression and used very gentle squeezing technique due to her sensitivity.  Mother complained of  hand expression "hurting" even though I barely squeezed her breast.  I asked her to do a return demonstration and her facial expression was one of disgust and pain.  She attempted to hand express, but really did not even squeeze the breast at all and complained of pain.  She did not continue.  Explained the importance of performing hand expression but doing it to her comfort level.  Mother stated that both nipples "hurt" and I tried to emotionally calm her prior to latching.  Assisted baby to latch in the cross cradle hold on the left breast without difficulty.  Mother cringed with latching and father stood by her bedside while I did some basic relaxation techniques with her.  She would not even hold baby because she had to focus on relaxing.  She was quite tense.  Observed baby feeding and the latch looked good.  Asked mother if the sensitivity was easing and, after about 5 minutes, she admitted it was.  It did not hurt "like the last time."  I was happy to hear this.  Continued to observe baby feed for an additional 5 minutes before she self released.  Upon releasing, mother's nipple was well rounded and no trauma noted.  Informed mother about using EBM and rubbing it into her nipple/areola but she cringed and gave me the impression she was not interested in  that.  I also discussed coconut oil and RN will bring her a bottle during shift change.  Instructions given on how to use this.    Encouraged mother to call for assistance as needed and reminded her that breast feeding takes time and patience.  Mother felt uterine contractions during her feeding and I explained how this was a positive sign of a good latch.  Reassured her that the more practice she and baby get with the breast feeding, the easier it will be.  Discussed STS, milk coming to volume, how to obtain and maintain a deep latch, breast compressions and signs of a content baby after feeding.  Mother appreciative of help.  Placed baby STS after feeding.  Reviewed feeding cues and mother will feed on cue or at least 8-12 times/24 hours.  Father present and supportive.  Rn updated.   Maternal Data Formula Feeding for Exclusion: No Has patient been taught Hand Expression?: Yes Does the patient have breastfeeding experience prior to this delivery?: No  Feeding Feeding Type: Breast Fed  LATCH Score Latch: Grasps breast easily, tongue down, lips flanged, rhythmical sucking.  Audible Swallowing: None  Type of Nipple: Everted at rest and after stimulation  Comfort (Breast/Nipple): Filling, red/small blisters or bruises, mild/mod discomfort  Hold (Positioning): Assistance needed to correctly position infant at breast and maintain latch.  LATCH Score: 6  Interventions Interventions: Breast feeding basics reviewed;Assisted with latch;Skin to skin;Breast massage;Hand express;Breast compression;Adjust position;Position options;Support pillows  Lactation Tools Discussed/Used     Consult Status Consult Status: Follow-up Date: 02/07/19 Follow-up type: In-patient    Keshawna Dix R Katniss Weedman 02/06/2019, 7:00 PM

## 2019-02-07 ENCOUNTER — Encounter (HOSPITAL_COMMUNITY): Payer: Self-pay | Admitting: *Deleted

## 2019-02-07 ENCOUNTER — Other Ambulatory Visit: Payer: Self-pay

## 2019-02-07 LAB — CBC
HCT: 27.4 % — ABNORMAL LOW (ref 36.0–46.0)
Hemoglobin: 9.6 g/dL — ABNORMAL LOW (ref 12.0–15.0)
MCH: 31.3 pg (ref 26.0–34.0)
MCHC: 35 g/dL (ref 30.0–36.0)
MCV: 89.3 fL (ref 80.0–100.0)
Platelets: 163 10*3/uL (ref 150–400)
RBC: 3.07 MIL/uL — ABNORMAL LOW (ref 3.87–5.11)
RDW: 13.5 % (ref 11.5–15.5)
WBC: 12.1 10*3/uL — ABNORMAL HIGH (ref 4.0–10.5)
nRBC: 0 % (ref 0.0–0.2)

## 2019-02-07 MED ORDER — RHO D IMMUNE GLOBULIN 1500 UNIT/2ML IJ SOSY
300.0000 ug | PREFILLED_SYRINGE | Freq: Once | INTRAMUSCULAR | Status: AC
  Administered 2019-02-07: 300 ug via INTRAMUSCULAR
  Filled 2019-02-07: qty 2

## 2019-02-07 NOTE — Lactation Note (Signed)
This note was copied from a baby's chart. Lactation Consultation Note  Patient Name: Hayley Hunt [id number] Date: 02/07/2019 Reason for consult: Follow-up assessment;Primapara;1st time breastfeeding;Term;Nipple pain/trauma;Infant weight loss  Mom called again for [facility] assistance. She voiced that every time she calls for lactation her RN sends a LC instead of coming herself to help her. Mom said she's called 6 times, however she's been seen by lactation only twice today and once per day since the day of her admission. Mom getting frustrated because her nipples are not healing and now her right breast (the most damaged one) is filling up and it's getting painful even to pump. Strongly encouraged her to do some hand expression to at least get enough colostrum to alleviate discomfort and rub it on her nipples.  Mom understands that pumping on the right breast will be critical to protect her supply, she cannot stand putting baby on the right breast, baby has only been feeding out of the left one, it feels softer than the right one. Parent will call for formula tonight, supplementation guidelines given, discussed amounts regarding baby's age in hours. LC also left a curve tip syringe, but parents may use a bottle with a slow flow nipple if that's their preference. The didn't need any assistance at this point, baby was asleep in her bassinet and not ready to feed, they didn't want to wake her up because she's been crying.  Feeding plan:  1. Encouraged mom to feed baby on the left breast STS 8-12 times/24 hours or sooner if feeding cues are present.  2. Mom will hand express/pump on the right breast and will give any amount of EBM she gets to baby prior offering any formula 3. Parents will be requesting formula tonight, RN Jan notified 4. Mom will keep applying her EBM and comfort gels to treat nipple pain/trauma  Parents reported all questions and concerns were answered, they're both aware of [place] OP  services and will call PRN. Mom would like to be followed up with lactation tomorrow morning.   Maternal Data    Feeding    LATCH Score                   Interventions Interventions: Breast feeding basics reviewed;Hand express;Breast compression;Breast massage;Comfort gels  Lactation Tools Discussed/Used Tools: Comfort gels   Consult Status Consult Status: Follow-up Date: 02/08/19 Follow-up type: In-patient    [name] Francene Boyers 02/07/2019, 10:56 PM

## 2019-02-07 NOTE — Anesthesia Postprocedure Evaluation (Signed)
Anesthesia Post Note  Patient: Kaeya Powe Nazar  Procedure(s) Performed: AN AD HOC LABOR EPIDURAL     Patient location during evaluation: Mother Baby Anesthesia Type: Epidural Level of consciousness: awake and alert and oriented Pain management: satisfactory to patient Vital Signs Assessment: post-procedure vital signs reviewed and stable Respiratory status: respiratory function stable Cardiovascular status: stable Postop Assessment: no headache, no backache, epidural receding, patient able to bend at knees, no signs of nausea or vomiting and adequate PO intake Anesthetic complications: no    Last Vitals:  Vitals:   02/06/19 2330 02/07/19 0611  BP: 92/61 92/60  Pulse: 92 84  Resp: 18 18  Temp: 36.6 C 36.7 C  SpO2: 98% 100%    Last Pain:  Vitals:   02/07/19 0725  TempSrc:   PainSc: 0-No pain   Pain Goal:                   Jhovanny Guinta

## 2019-02-07 NOTE — Lactation Note (Signed)
This note was copied from a baby's chart. Lactation Consultation Note  Patient Name: Hayley Hunt [id number] Date: 02/07/2019 Reason for consult: Follow-up assessment;Term;Primapara;1st time breastfeeding;Infant weight loss;Nipple pain/trauma  39 hours old FT female who is being exclusively BF by her mother, she's a P1. Mom is complaining of soreness and has scabs on both breasts and they are very sensitive. She has been set up with a hand pump, breast shells, coconut oil, comfort gels and a nipple shield. Mom has only be using the coconut oil so far, she told LC that the NS didn't work out and that baby doesn't like it. Baby is at 4% weight loss.  Offered assistance with latch and mom told LC this was perfect timing because she was getting ready to feed baby. LC tried to work on some hand expression with mom but unable to get colostrum at this point, mom kept cringing and complaining of pain at every compression, LC had to stop and couldn't get any colostrum, she can't stand any type of pressure in her breast/nipples without any pain.   LC took baby STS to mother's left breast and baby latched easily, but she had to be repositioned a couple of times because she broke the latch. Advised mom that is better to start all over again if baby looses the depth instead of letting her feed with a shallow latch. Mom aware that shallow latches lead to sore nipples. Parents reported Hx of frenulum in the family and will check with baby's pediatrician tomorrow before they go home. Baby fed for 14 minutes, but no audible swallows noted. Mom burped baby and willing to work with pumping if that will lead to some colostrum.  LC used coconut oil for some lubrication for the hand pump, mom's breast are so sensitive that she won't even tolerate pulling the handle down all the day, her eyes started covering the tears, LC had to stop. LC offered to set up a DEBP earlier but mom most likely won't stand the suction of a  DEBP.   Advised parents to keep working on getting some colostrum for baby and that every drop counts, but if mom cannot get any due to the pain she's experiencing when compressing parents are OK with supplementing with formula, baby most likely will cluster feeding tonight. Reviewed treatment/prevention for sore nipples, cluster feeding, normal newborn behavior and feeding cues.  Feeding plan:  1. Encouraged mom to feed baby STS 8-12 times/24 hours or sooner if feeding cues are present. If latch is too painful she'll try to hand express 2. Hand expression/pumping were also encouraged prior feedings 3. Parents may be requesting formula tonight if latching/hand expression is too painful for mom, no colostrum yield due to the inability of compressing breast tissue  Parents reported all questions and concerns were answered, they're both aware of [place] OP services and will call PRN.  Maternal Data    Feeding Feeding Type: Breast Fed  LATCH Score Latch: Grasps breast easily, tongue down, lips flanged, rhythmical sucking.  Audible Swallowing: None  Type of Nipple: Everted at rest and after stimulation  Comfort (Breast/Nipple): Filling, red/small blisters or bruises, mild/mod discomfort  Hold (Positioning): Assistance needed to correctly position infant at breast and maintain latch.  LATCH Score: 6  Interventions Interventions: Breast feeding basics reviewed;Assisted with latch;Skin to skin;Breast massage;Hand express;Breast compression;Adjust position;Support pillows;Coconut oil;Hand pump  Lactation Tools Discussed/Used Tools: Pump;Coconut oil Breast pump type: Manual   Consult Status Consult Status: Follow-up Date: 02/08/19 Follow-up type:  In-patient    Hayley Hunt 02/07/2019, 3:05 PM

## 2019-02-07 NOTE — Progress Notes (Signed)
Post Partum Day 1 Subjective: no complaints, up ad lib, voiding and tolerating PO  Objective: Blood pressure 92/60, pulse 84, temperature 98 F (36.7 C), temperature source Oral, resp. rate 18, height 5' (1.524 m), weight 64.5 kg, last menstrual period 04/28/2018, SpO2 100 %, unknown if currently breastfeeding.  Physical Exam:  General: alert, cooperative, appears stated age and no distress Lochia: appropriate Uterine Fundus: firm Incision: healing well DVT Evaluation: No evidence of DVT seen on physical exam.  Recent Labs    02/06/19 0022 02/07/19 0520  HGB 13.4 9.6*  HCT 38.4 27.4*    Assessment/Plan: Plan for discharge tomorrow and Breastfeeding   LOS: 1 day   Hayley Hunt 02/07/2019, 8:51 AM

## 2019-02-07 NOTE — Lactation Note (Signed)
This note was copied from a baby's chart. Lactation Consultation Note Baby 35 hrs old. Has BF well a couple of times. Last feeding was around 2230. Mom has attempted to BF several times since then d/t baby cueing. Baby laying in crib fussing when [place] came in rm. Mom resting, FOB awake. FOB stated he had just put the baby in the crib.  [place] talking w/mom about how feedings were going and questions she has. LC stated that baby is cueing and needs to go to breast w/LC assisting. Asked mom positioning of her feedings. Mom stated cradle.  LC suggested football, mom stated it didn't work out to good. LC checked baby diaper while mom positions herself up in bed. Baby voided and stool. Mom making frowning facial expression over baby having another large stool. Mom stated baby has hardly eaten how is she pooping so much. explained it is what was inside baby from being in the womb. During change baby had emesis. Bulb syring used to clear mouth.  Mom put baby to [place]. Breast in cradle position. Lt. Nipple has a small red area to nipple. Rt. Nipple cracked, bruised and has been bleeding mom stated.  Encouraged mom not to put her fingers on areola d/t blocking baby from getting nipple d/t fingers touching nipple. Mom doesn't have good control of baby and latching d/t cradle position. Baby got on nipple a couple of times, some very painful, once not. Suggested mom to try football. Explained to mom baby will be able to get a deeper latch and she can have control of baby and her breast. LC trying to prevent further nipple trauma. Mom agreed as she rolled her eyes. Pillows for positioning, body alignment, support, discussed. Baby attempted to have emesis trying to throw up. LC sat baby upright patting on back. When baby ready, LC placed in football position. Baby wouldn't open her mouth w/nipple stimulation to top lip. Mom stated baby didn't want to feed. Asked if she wanted LC to swaddle and mom hold her. Mom stated  yes. Newborn feeding habits and behavior discussed. Encouraged to try w/cues and every three hours if hasn't cued. Mom worried because baby hasn't fed much. Again LC explained newborn behavior.   Mom stated the pain with latching at times is bad. She has sensitive breast. LC suggested trying NS especially since she is cracked and bleeding. #20, #24 NS taken to room, briefly demonstrated how they work. Shells given to mom, encouraged her to wear them in am w/bra. CG given to place on nipples now while resting. Mom has coconut oil at bedside, hasn't used it yet. Explained not to use CG w/coconut oil on nipples. Hand pump given, mom looking tired appearing not attentive as if she ready to rest. LC put pump together placed on bedside table. Explained will show her how to use it later. As sensitive as mom is, LC doesn't think mom can tolerate pumping.  Encouraged mom to rest while baby rest, wake baby w/cues and 3 hrs if hasn't cued. Call for assistance if needed.  Patient Name: Hayley Hunt Bris[place] [id number] Date: 02/07/2019 Reason for consult: Mother's request;Nipple pain/trauma;Difficult latch;Primapara   Maternal Data Has patient been taught Hand Expression?: Yes Does the patient have breastfeeding experience prior to this delivery?: No  Feeding    LATCH Score Latch: Too sleepy or reluctant, no latch achieved, no sucking elicited.  Audible Swallowing: None  Type of Nipple: Everted at rest and after stimulation(very short shaft)  Hold (Positioning): Full assist, staff holds infant at breast     Interventions Interventions: Breast feeding basics reviewed;Adjust position;Assisted with latch;Support pillows;Position options;Skin to skin;Breast massage;Shells;Pre-pump if needed;Comfort gels;Breast compression;Hand pump  Lactation Tools Discussed/Used Tools: Shells;Pump;Coconut oil;Comfort gels;Nipple Shields Nipple shield size: 20;24(left in rm to try for next feeding) Shell Type:  Inverted Breast pump type: Manual WIC Program: No Initiated by:: Allayne Stack RN IBCLC Date initiated:: 02/07/19   Consult Status Consult Status: Follow-up Date: 02/07/19 Follow-up type: In-patient    Alazar Cherian, Elta Guadeloupe 02/07/2019, 2:51 AM

## 2019-02-08 LAB — RH IG WORKUP (INCLUDES ABO/RH)
ABO/RH(D): A NEG
Fetal Screen: NEGATIVE
Gestational Age(Wks): 40
Unit division: 0

## 2019-02-08 MED ORDER — IBUPROFEN 600 MG PO TABS: 600.0000 mg | ORAL_TABLET | Freq: Four times a day (QID) | ORAL | 0 refills | Status: DC | PRN

## 2019-02-08 NOTE — Discharge Summary (Signed)
Obstetric Discharge Summary Reason for Admission: induction of labor Prenatal Procedures: none Intrapartum Procedures: spontaneous vaginal delivery Postpartum Procedures: none Complications-Operative and Postpartum: 3 degree perineal laceration Hemoglobin  Date Value Ref Range Status  02/07/2019 9.6 (L) 12.0 - 15.0 g/dL Final    Comment:    REPEATED TO VERIFY DELTA CHECK NOTED   05/01/2018 14.2 11.1 - 15.9 g/dL Final   HCT  Date Value Ref Range Status  02/07/2019 27.4 (L) 36.0 - 46.0 % Final   Hematocrit  Date Value Ref Range Status  05/01/2018 41.4 34.0 - 46.6 % Final    Physical Exam:  General: alert, cooperative, appears stated age and no distress Lochia: appropriate Uterine Fundus: firm Incision: healing well DVT Evaluation: No evidence of DVT seen on physical exam.  Discharge Diagnoses: Term Pregnancy-delivered  Discharge Information: Date: 02/08/2019 Activity: pelvic rest Diet: routine Medications: PNV, Ibuprofen and Colace Condition: stable Instructions: refer to practice specific booklet Discharge to: home   Newborn Data: Live born female  Birth Weight: 6 lb 12.8 oz (3084 g) APGAR: 77, 9  Newborn Delivery   Birth date/time: 02/06/2020 13:33:00 Delivery type: Vaginal, Spontaneous      Home with mother.  Luz Lex 02/08/2019, 9:15 AM

## 2019-02-08 NOTE — Progress Notes (Signed)
Followed up with tech why no vital signs were recorded for 2200.  Reported that they were done automatically but information didn't transfer

## 2019-02-08 NOTE — Lactation Note (Signed)
This note was copied from a baby's chart. Lactation Consultation Note  Patient Name: Hayley Hunt [id number] Date: 02/08/2019 Reason for consult: Follow-up assessment;Primapara;Term;Nipple pain/trauma Baby is 44 hours old/8% weight loss.  Mom has nipple trauma and has been resting nipples.  Baby has been formula fed by syringe.  Mom is not pumping because she worried pump would hurt.  Breasts are filling.  Discussed milk coming to volume and the prevention and treatment of engorgement.  Reviewed importance of pumping to establish and maintain milk supply.  Mom agreed to using symphony pump.  She has a DEBP at home.  Mom pumped on low setting for 15 minutes using 24 mm flanges.  She tolerated it well.  At the end of pumping nipples were rubbing so recommended she use the 27 mm flanges next time.  Mom pumped 7 mls of transitional milk. Reviewed volume guidelines and instructed to give 20-30 mls of expressed milk/formula today.  Discussed increasing volumes daily the first week.  Instructed to transition from the curved tip syringe to a slow flow bottle now that volumes have increased.  Recommended an outpatient appointment next week to assist with latch.  Maternal Data    Feeding    LATCH Score                   Interventions Interventions: Comfort gels;Coconut oil;Shells;DEBP  Lactation Tools Discussed/Used Pump Review: Setup, frequency, and cleaning Initiated by:: [email or web address] Date initiated:: 02/08/19   Consult Status Consult Status: Complete Follow-up type: Call as needed    Ave Filter 02/08/2019, 10:08 AM

## 2019-02-08 NOTE — Progress Notes (Signed)
Latex allergy listed on chart, pt states she receives Flu vaccine every year with no problems. She requests to receive vaccine today.

## 2019-02-08 NOTE — Progress Notes (Addendum)
Patient ready to go home, no observed reaction to flu vaccine.

## 2019-02-09 LAB — TYPE AND SCREEN
ABO/RH(D): A NEG
Antibody Screen: POSITIVE
Unit division: 0
Unit division: 0

## 2019-02-09 LAB — BPAM RBC
Blood Product Expiration Date: 202011192359
Blood Product Expiration Date: 202011192359
Unit Type and Rh: 600
Unit Type and Rh: 600

## 2019-02-12 ENCOUNTER — Ambulatory Visit: Payer: Self-pay

## 2019-02-12 NOTE — Lactation Note (Signed)
This note was copied from a baby's chart. Lactation Consultation Note  Patient Name: Rayburn Ma Today's Date: 02/12/2019   Mom came in today with hard, hot, lumpy, painful breasts.  Hard area under rt axilla.  Mom reports being unable to put Ivy to the breast.  Moist warmth applied and breast massaged.  Mom is in so much pain that very little breast massage and hand expression was accomplished.  Demonstrated to mom how to massage hard areas to get milk moving.  Mom, using her own DEBP (Medela) expressed 1 oz pumping for 20 minutes bilaterally.  Cabbage leaves applied after pumping.  Mom reports this is the most milk she has been able to get and the best breasts have felt.  There were still hard areas, but both breasts were softer after pumping.  Ice applied under right axilla.  Mom agrees to let FOB continue to massage hard areas in breasts at home.  Explained symptomatic differences in breast fullness, engorgement and mastitis encouraging mom to call health care provider if develops into mastitis.  Once mom thinks she can tolerate to put Ivy back to the breast, she will come in for feeding assessment, but mom is doubtful that whe will be able to do this at this point.  Mom does want to keep with present plan of warmth, massage, pumping and cabbage leaves after to reduce engorgement for now.  Mom requesting information about how to wean gently if breast feeding and/or pumping did not work for her.    Maternal Data    Feeding    LATCH Score                   Interventions    Lactation Tools Discussed/Used     Consult Status      Jarold Motto 02/12/2019, 3:07 PM

## 2019-05-08 ENCOUNTER — Encounter: Payer: 59 | Admitting: Family Medicine

## 2019-05-12 ENCOUNTER — Ambulatory Visit: Payer: 59 | Admitting: Neurology

## 2019-06-16 ENCOUNTER — Ambulatory Visit (INDEPENDENT_AMBULATORY_CARE_PROVIDER_SITE_OTHER): Payer: 59 | Admitting: Neurology

## 2019-06-16 ENCOUNTER — Encounter: Payer: Self-pay | Admitting: Neurology

## 2019-06-16 ENCOUNTER — Other Ambulatory Visit: Payer: Self-pay

## 2019-06-16 VITALS — BP 106/58 | HR 79 | Temp 97.8°F | Ht 60.0 in | Wt 127.4 lb

## 2019-06-16 DIAGNOSIS — G43109 Migraine with aura, not intractable, without status migrainosus: Secondary | ICD-10-CM | POA: Diagnosis not present

## 2019-06-16 MED ORDER — UBRELVY 50 MG PO TABS: 50.0000 mg | ORAL_TABLET | ORAL | 3 refills | Status: DC | PRN

## 2019-06-16 NOTE — Progress Notes (Signed)
Subjective:    Patient ID: Hayley Hunt is a 28 y.o. female.  HPI     Star Age, MD, PhD [facility] [address], [address].O. [place], [place] [address]  Dear Dr. Corinna Capra,   I saw your patient, Hayley Hunt, upon your kind request, in my neurologic clinic today for initial consultation of her recurrent headaches, concern for migraines.  The patient is unaccompanied today.  As you know, Hayley Hunt is a 29 year old right-handed woman with an underlying medical history of vitamin D deficiency, reflux disease, eczema, asthma, and allergies, who reports a history of migraines with aura for the past 3 years, started when she was about 29 years old.  She first started having visual auras and left hand numbness, when she first had her initial migraine attack, she went to get checked out immediately as she did not know what it was.  Her mother has a history of migraines but not recently, her brother tends to get migraines as well.  She had more migraines around her menstrual period.  Triggers are dehydration and sleep deprivation and it helps to go to bed to sleep after taking some ibuprofen.  She has not been on prescription medications for migraines as they have been thankfully infrequent.  She can go weeks or months without 1.  She was placed on an oral contraceptive pill in 2018 but her primary care physician advised against it.  On a no estrogen pill she had breakthrough bleeding and spotting.  She would like to restart an oral contraceptive pill.  She is not breast-feeding.  She had a baby in October 2020 and breast-fed for about 2 weeks or so.  She drinks caffeine in the form of coffee, 1 cup/day.  She drinks alcohol rarely.  She does not smoke.  She takes as needed medication for her asthma.  She does get a visual aura, one-sided throbbing headache and sometimes left hand numbness.  Her migraine can last 24 hours before she feels back to baseline again.  She works at  [employer], she has prescription eyeglasses for driving and reading, last eye examination was 6 months ago. I reviewed your office records.  She tries to hydrate well with water.  She has had intermittent dizzy spells and vertiginous symptoms without headaches in the past, none in the past year.  She had seen ENT for this and was told that she may have vertiginous migraines.  When she was pregnant her migraines became worse during weeks 11 to 16.  Since her baby she has not had a migraine thankfully. Of note, she had a brain MRI as well as internal auditory canal MRI with and without contrast on 03/28/2018 and I reviewed the results: IMPRESSION: Normal MRI appearance of the brain and normal internal auditory imaging.  She had a head CT without contrast on 04/17/2016 and I reviewed the results: IMPRESSION: Negative head CT.  No evidence of acute intracranial abnormality.  Her Past Medical History Is Significant For: Past Medical History:  Diagnosis Date  . Allergic asthma without acute exacerbation or status asthmaticus   . Asthma, mild 09/13/2015  . Common ichthyosis   . Eczema   . GERD (gastroesophageal reflux disease)   . H/O abnormal cervical Papanicolaou smear 11/22/2014   Repeat PAP on 04/04/15 normal. [facility] saw patient for LGSIL PAP with normal Colpo exam with biopsy in 2015 - 2016   . History of abnormal cervical Pap smear   . Ovarian  cyst   . Vitamin D deficiency     Her Past Surgical History Is Significant For: Past Surgical History:  Procedure Laterality Date  . FLEXIBLE SIGMOIDOSCOPY  03/24/2018   Procedure: FLEXIBLE SIGMOIDOSCOPY;  Surgeon: Lin Landsman, MD;  Location: [facility] ENDOSCOPY;  Service: Gastroenterology;;  . Arnetha Courser TOOTH EXTRACTION      Her Family History Is Significant For: Family History  Problem Relation Age of Onset  . Asthma Mother   . Hypothyroidism Mother   . Lupus Mother   . Osteoporosis Maternal Grandmother   . Heart attack Maternal  Grandfather   . Heart disease Maternal Grandfather   . Cancer Paternal Grandfather        lung  . Breast cancer Neg Hx   . Ovarian cancer Neg Hx     Her Social History Is Significant For: Social History   Socioeconomic History  . Marital status: Single    Spouse name: Not on file  . Number of children: Not on file  . Years of education: 10  . Highest education level: Associate degree: academic program  Occupational History  . Not on file  Tobacco Use  . Smoking status: Never Smoker  . Smokeless tobacco: Never Used  Substance and Sexual Activity  . Alcohol use: No    Alcohol/week: 0.0 standard drinks  . Drug use: No  . Sexual activity: Not Currently    Partners: Male    Birth control/protection: None  Other Topics Concern  . Not on file  Social History Narrative  . Not on file   Social Determinants of Health   Financial Resource Strain:   . Difficulty of Paying Living Expenses: Not on file  Food Insecurity:   . Worried About Charity fundraiser in the Last Year: Not on file  . Ran Out of Food in the Last Year: Not on file  Transportation Needs:   . Lack of Transportation (Medical): Not on file  . Lack of Transportation (Non-Medical): Not on file  Physical Activity:   . Days of Exercise per Week: Not on file  . Minutes of Exercise per Session: Not on file  Stress:   . Feeling of Stress : Not on file  Social Connections:   . Frequency of Communication with Friends and Family: Not on file  . Frequency of Social Gatherings with Friends and Family: Not on file  . Attends Religious Services: Not on file  . Active Member of Clubs or Organizations: Not on file  . Attends Archivist Meetings: Not on file  . Marital Status: Not on file    Her Allergies Are:  Allergies  Allergen Reactions  . Amoxicillin Hives and Rash  . Clindamycin/Lincomycin Rash  . Metronidazole Itching, Other (See Comments) and Rash    Mouth swelling  . Penicillins Anaphylaxis and  Hives  . Lactose Intolerance (Gi)   . Latex   :   Her Current Medications Are:  Outpatient Encounter Medications as of 06/16/2019  Medication Sig  . albuterol (VENTOLIN HFA) 108 (90 BASE) MCG/ACT inhaler Inhale 1-2 puffs into the lungs every 6 (six) hours as needed for wheezing or shortness of breath.   Marland Kitchen Ubrogepant (UBRELVY) 50 MG TABS Take 50 mg by mouth as needed (may repeat once in 2 hours. no more than 2 pills in 24 h.).  . [DISCONTINUED] calcium-vitamin D (OSCAL WITH D) 500-200 MG-UNIT tablet Take 1 tablet by mouth.  . [DISCONTINUED] ibuprofen (ADVIL) 600 MG tablet Take 1 tablet (600 mg  total) by mouth every 6 (six) hours as needed for headache, mild pain, moderate pain or cramping.  . [DISCONTINUED] Prenatal Vit-Fe Fumarate-FA (MULTIVITAMIN-PRENATAL) 27-0.8 MG TABS tablet Take 1 tablet by mouth daily at 12 noon.   No facility-administered encounter medications on file as of 06/16/2019.  :   Review of Systems:  Out of a complete 14 point review of systems, all are reviewed and negative with the exception of these symptoms as listed below:  Review of Systems  Neurological:       Here for consult on migraines. Pt reports 6 migraines per year. Reports while she was pregnant her migraines did increase but once she delivered 4 months ago migraines have improved.     Objective:  Neurological Exam  Physical Exam Physical Examination:   Vitals:   06/16/19 1004  BP: (!) 106/58  Pulse: 79  Temp: 97.8 F (36.6 C)    General Examination: The patient is a very pleasant 29 y.o. female in no acute distress. She appears well-developed and well-nourished and well groomed.   HEENT: Normocephalic, atraumatic, pupils are equal, round and reactive to light and accommodation. Funduscopic exam is normal with sharp disc margins noted. Extraocular tracking is good without limitation to gaze excursion or nystagmus noted. Normal smooth pursuit is noted. Hearing is grossly intact. Face is symmetric  with normal facial animation and normal facial sensation. Speech is clear with no dysarthria noted. There is no hypophonia. There is no lip, neck/head, jaw or voice tremor. Neck is supple with full range of passive and active motion. There are no carotid bruits on auscultation. Oropharynx exam reveals: mild mouth dryness, good dental hygiene. Tongue protrudes centrally and palate elevates symmetrically.   Chest: Clear to auscultation without wheezing, rhonchi or crackles noted.  Heart: S1+S2+0, regular and normal without murmurs, rubs or gallops noted.   Abdomen: Soft, non-tender and non-distended with normal bowel sounds appreciated on auscultation.  Extremities: There is no pitting edema in the distal lower extremities bilaterally. Pedal pulses are intact.  Skin: Warm and dry with signs of eczema.   Musculoskeletal: exam reveals no obvious joint deformities, tenderness or joint swelling or erythema.   Neurologically:  Mental status: The patient is awake, alert and oriented in all 4 spheres. Her immediate and remote memory, attention, language skills and fund of knowledge are appropriate. There is no evidence of aphasia, agnosia, apraxia or anomia. Speech is clear with normal prosody and enunciation. Thought process is linear. Mood is normal and affect is normal.  Cranial nerves II - XII are as described above under HEENT exam. In addition: shoulder shrug is normal with equal shoulder height noted. Motor exam: Normal bulk, strength and tone is noted. There is no drift, tremor or rebound. Romberg is negative. Reflexes are 2+ throughout. Babinski: Toes are flexor bilaterally. Fine motor skills and coordination: intact with normal finger taps, normal hand movements, normal rapid alternating patting, normal foot taps and normal foot agility.  Cerebellar testing: No dysmetria or intention tremor on finger to nose testing. Heel to shin is unremarkable bilaterally. There is no truncal or gait ataxia.   Sensory exam: intact to light touch, vibration, temperature sense in the upper and lower extremities.  Gait, station and balance: She stands easily. No veering to one side is noted. No leaning to one side is noted. Posture is age-appropriate and stance is narrow based. Gait shows normal stride length and normal pace. No problems turning are noted. Tandem walk is unremarkable.  Assessment and Plan:   In summary, MELINDA DUBE is a very pleasant 29 y.o.-year old female with an underlying medical history of vitamin D deficiency, reflux disease, eczema, asthma, and allergies, who Presents for evaluation of her recurrent migrainous headaches.  Her history is in keeping with migraines with aura.  She has no intractable headaches currently.  Thankfully, she has had no recent migraines.  In the past, she was evaluated and dizziness, she has not had any recent issues.  She was on oral contraceptive medication for several months in the past without any escalation in her migraines.  She is currently not breast-feeding.  Since she has delivered her baby she has not had any serious migraines.  Her neurological exam is nonfocal which is also reassuring.  She had a brain MRI in the context of evaluation for dizziness and vertigo and it was benign at the time.  She had a recent eye examination as well.  She is advised to use as needed migraine medication in the form of Ubrelvy.  We talked about this medication, its expectations and potential side effects, she was given written instructions and a new prescription.I do not believe she needs daily preventative medication for migraines.  She is advised regarding common headache and migraine triggers.  In particular, she is advised to stay well-hydrated, well rested, not to skip any meals.She is wondering if she can restart oral contraceptive pills with estrogen.  She is advised to talk to you about the lowest estrogen dose possible.  She had tried no estrogen type oral  contraceptive pills before which caused her to have spotting or breakthrough bleeding.  She is encouraged to talk to you about her options.  She should be cautious with high estrogen contentType OCPs, Generally speaking I do not believe she has any actual contraindication in trying oral contraceptives.  She is reassured and again encouraged to talk to about her birth control options.  She is advised regarding the increased risk for stroke in patients who have migraine headaches and take oral contraceptive pills with estrogen and have additional risk factors for stroke such as smoking.  Thankfully, she has a benign risk factor profile. She is advised to follow-up routinely with the nurse practitioner in 3 months.  I answered all her questions today and she was in agreement. Thank you very much for allowing me to participate in the care of this nice patient. If I can be of any further assistance to you please do not hesitate to call me at [phone].  Sincerely,   Star Age, MD, PhD

## 2019-06-16 NOTE — Patient Instructions (Signed)
I do believe you have migraines with aura for the past 3 years or so.  Thankfully, your migraines have been infrequent.  I think we can get by with Using as needed medication.  I would suggest you try Start Ubrelvy, 50 mg strength: Take 1 pill at onset of migraine headache, may repeat in 2 hours, no more than 2 pills in 24 hours, but max approved dose is not to exceed 200 mg per 24 hours. May cause sedation and nausea.  Alternatively, we could try Nurtec.  At this juncture, you do not need to be on daily migraine prevention.  If you want to use oral contraceptive pills for birth control, I would recommend that you stick with the lowest estrogen dose possible. Since you do not have any significant cardiovascular risk factors and you do not smoke, you should be okay taking an OCP. Please talk to your GYN about your options. Please remember, common headache and migraine triggers are: sleep deprivation, dehydration, overheating, stress, hypoglycemia or skipping meals and blood sugar fluctuations, excessive pain medications or excessive alcohol use or caffeine withdrawal. Some people have food triggers such as aged cheese, orange juice or chocolate, especially dark chocolate, or MSG (monosodium glutamate). Try to avoid these headache triggers as much possible. It may be helpful to keep a headache diary to figure out what makes your headaches worse or brings them on and what alleviates them. Some people report headache onset after exercise but studies have shown that regular exercise may actually prevent headaches from coming. If you have exercise-induced headaches, please make sure that you drink plenty of fluid before and after exercising and that you do not over do it and do not overheat.

## 2019-10-08 IMAGING — MR MR BRAIN/IAC WO/W
11 of 12 series · 40 of 48 positions shown · IV contrast (gadavist)
Comparison: Head CT without contrast 04/17/2016.

CLINICAL DATA: 27-year-old female with intermittent dizziness for
the past month. History of migraines.

EXAM:
MRI HEAD WITHOUT AND WITH CONTRAST
TECHNIQUE: Multiplanar, multiecho pulse sequences of the brain and surrounding
structures were obtained without and with intravenous contrast.
CONTRAST:  6 milliliters Gadavist

[Series 3: DWI · axial · 3.0mm · 1.20mm/px · z∈[-90,+71]mm · 8 of 55 slices shown (1 of 2)]
[im 1/55]
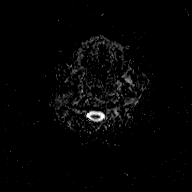
[im 8/55]
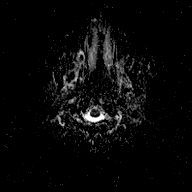
[im 16/55]
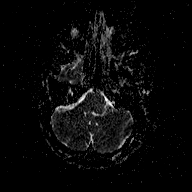
[im 24/55]
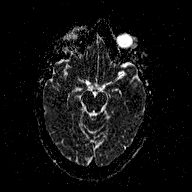
[im 31/55]
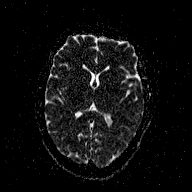
[im 39/55]
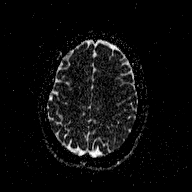
[im 47/55]
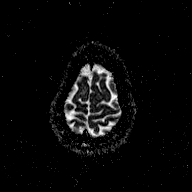
[im 55/55]
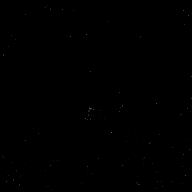

[Series 4: T1 · sagittal · 5.0mm · 0.45mm/px · 4 of 23 slices shown (1 of 3)]
[im 1/23]
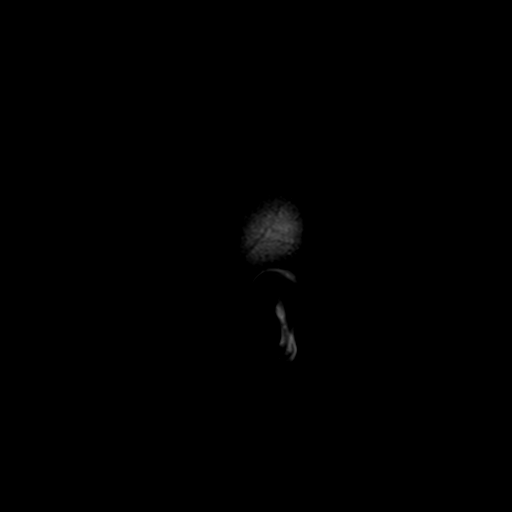
[im 8/23]
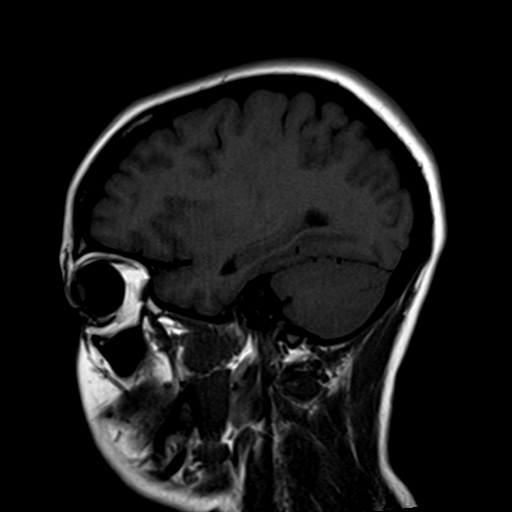
[im 15/23]
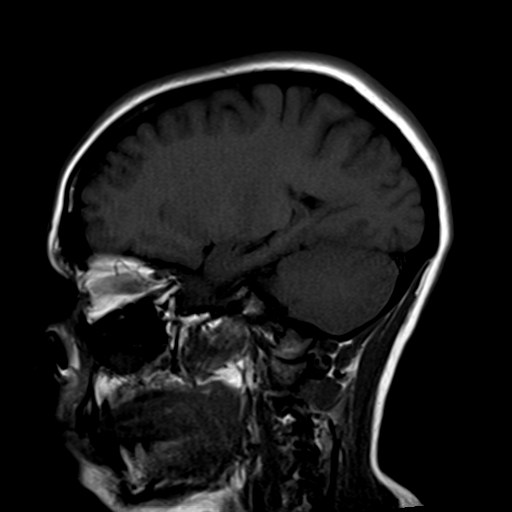
[im 23/23]
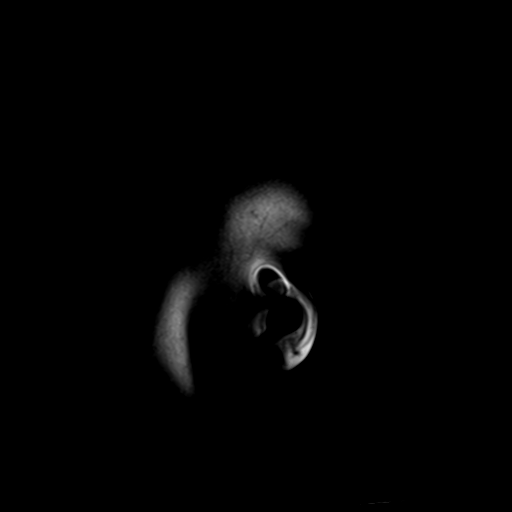

[Series 5: T2 · axial · 5.0mm · 0.72mm/px · z∈[-87,+68]mm · 4 of 25 slices shown (1 of 2)]
[im 1/25]
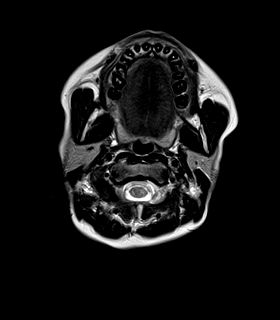
[im 9/25]
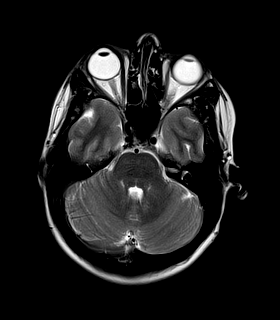
[im 17/25]
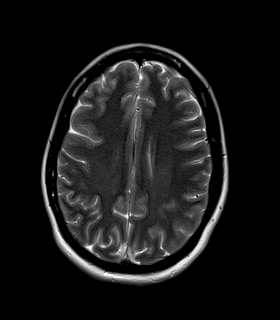
[im 25/25]
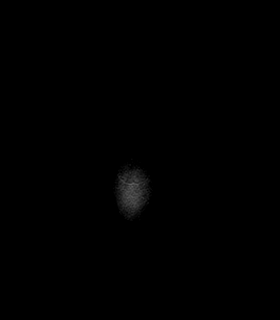

[Series 6: T2 · axial · 5.0mm · 0.72mm/px · z∈[-87,+68]mm · 3 of 25 slices shown (2 of 2)]
[im 1/25]
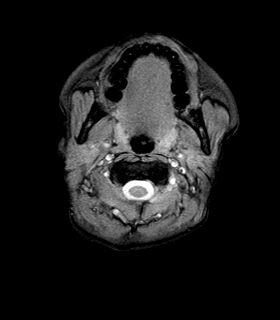
[im 13/25]
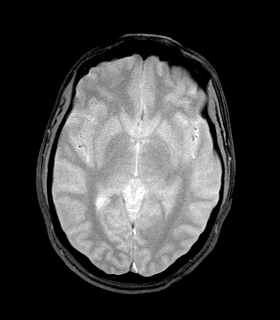
[im 25/25]
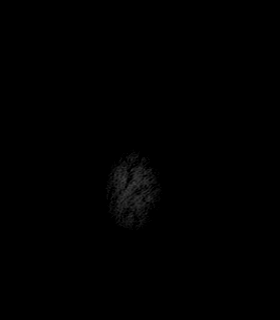

[Series 7: T1 · coronal · 3.0mm · 0.37mm/px · 1 of 11 slices shown (2 of 3)]
[im 1/11]
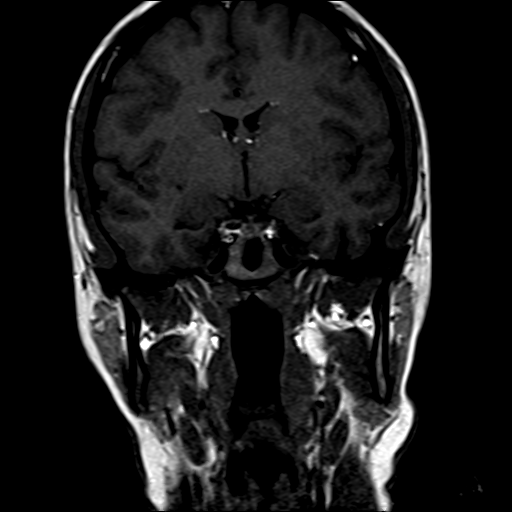

[Series 8: FLAIR · axial · 5.0mm · 0.45mm/px · z∈[-87,+68]mm · 3 of 25 slices shown]
[im 1/25]
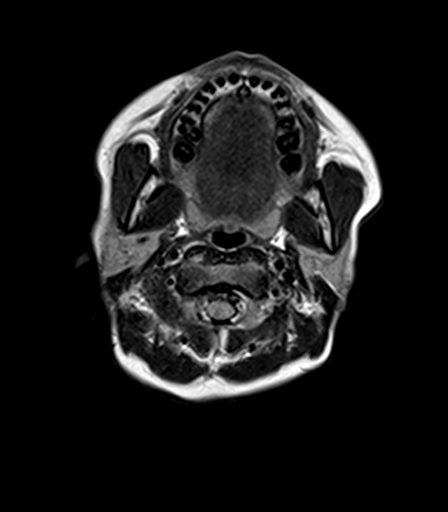
[im 13/25]
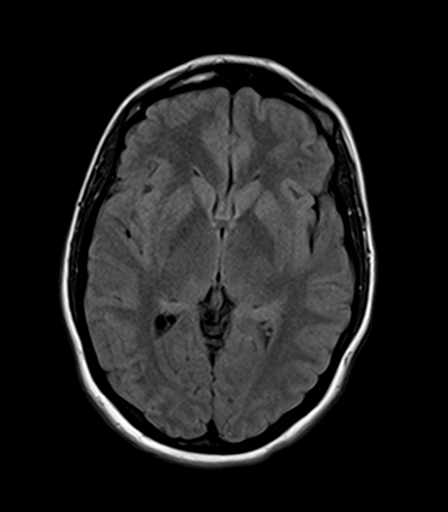
[im 25/25]
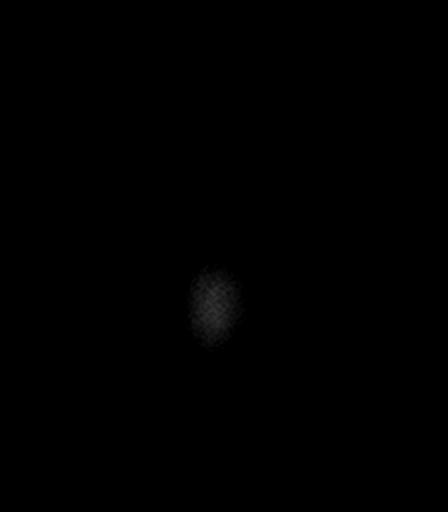

[Series 9: T1 · axial · 3.0mm · 0.37mm/px · 1 of 11 slices shown (3 of 3)]
[im 1/11]
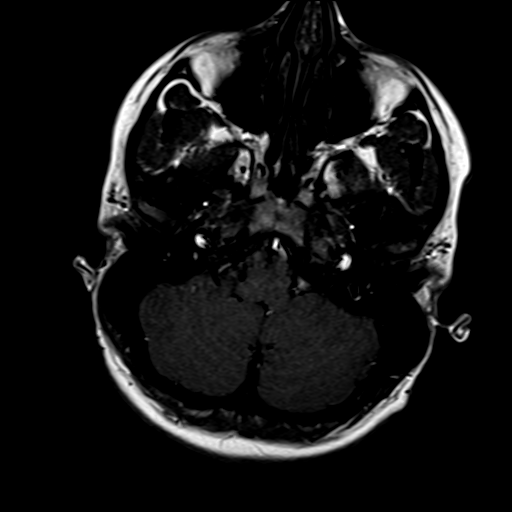

[Series 12: T1 post-contrast · axial · 3.0mm · 1.00mm/px · z∈[-87,+66]mm · 7 of 52 slices shown (1 of 3)]
[im 1/52]
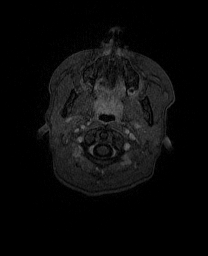
[im 9/52]
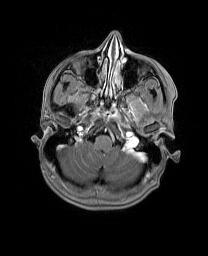
[im 18/52]
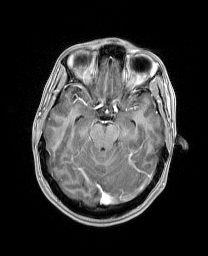
[im 26/52]
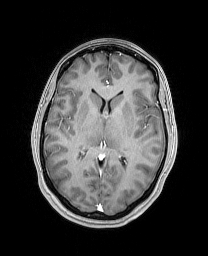
[im 35/52]
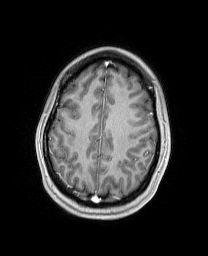
[im 43/52]
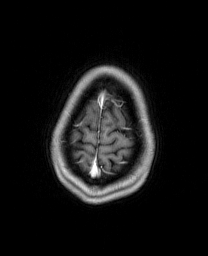
[im 52/52]
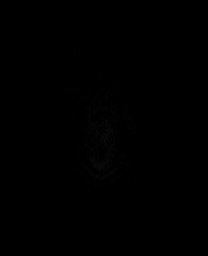

[Series 14: T1 post-contrast · coronal · 3.0mm · 0.37mm/px · 1 of 11 slices shown (2 of 3)]
[im 1/11]
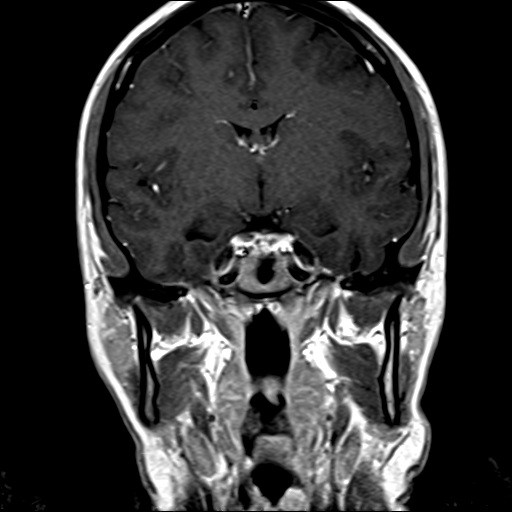

[Series 15: T1 post-contrast · axial · 3.0mm · 0.37mm/px · 1 of 11 slices shown (3 of 3)]
[im 1/11]
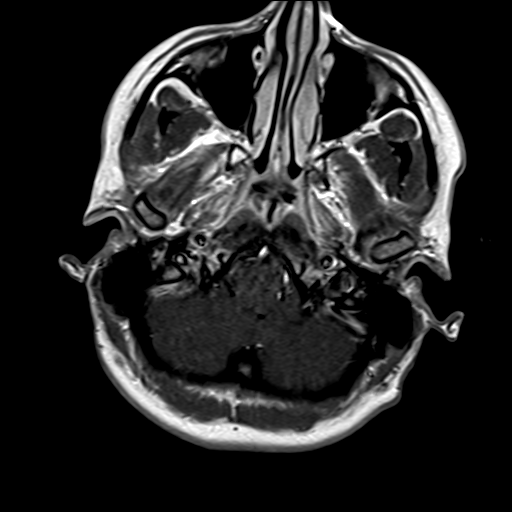

[Series 100: DWI · axial · 3.0mm · 1.20mm/px · z∈[-90,+71]mm · 7 of 55 slices shown (2 of 2)]
[im 1/55]
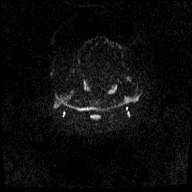
[im 10/55]
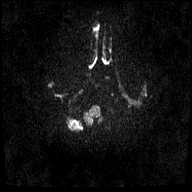
[im 19/55]
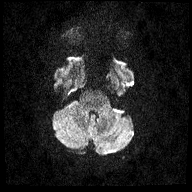
[im 28/55]
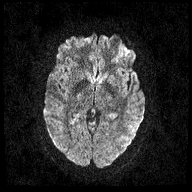
[im 37/55]
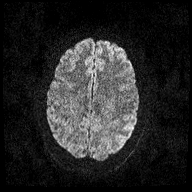
[im 46/55]
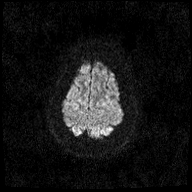
[im 55/55]
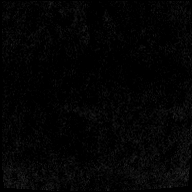

[40 of 48 positions shown; findings below may reference images not displayed]

FINDINGS: Brain: Normal cerebral volume. No restricted diffusion to suggest
acute infarction. No midline shift, mass effect, evidence of mass
lesion, ventriculomegaly, extra-axial collection or acute
intracranial hemorrhage. Cervicomedullary junction and pituitary are
within normal limits.

Gray and white matter signal is within normal limits throughout the
brain. No encephalomalacia or chronic cerebral blood products. No
abnormal enhancement identified. No dural thickening.

Vascular: Major intracranial vascular flow voids are preserved and
appear normal.

Skull and upper cervical spine: Normal visible cervical spine.
Visualized bone marrow signal is within normal limits.

Sinuses/Orbits: Negative orbits. Paranasal sinuses are well
pneumatized. Scalp and face soft tissues appear negative.

Other: Dedicated internal auditory imaging. Normal cerebellopontine
angles. Normal bilateral cisternal and intracanalicular 7th and 8th
cranial nerve segments. Symmetric and normal appearing T2 signal in
the cochlea and vestibular structures. Bilateral mastoid air cells
are clear. No abnormal enhancement identified. Stylomastoid foramina
appear normal. Parotid glands appear normal. No skull base
abnormality identified.
IMPRESSION: Normal MRI appearance of the brain and normal internal auditory
imaging.

## 2019-10-22 DIAGNOSIS — Z419 Encounter for procedure for purposes other than remedying health state, unspecified: Secondary | ICD-10-CM | POA: Diagnosis not present

## 2019-11-22 DIAGNOSIS — Z419 Encounter for procedure for purposes other than remedying health state, unspecified: Secondary | ICD-10-CM | POA: Diagnosis not present

## 2019-12-14 ENCOUNTER — Ambulatory Visit: Payer: 59 | Admitting: Family Medicine

## 2019-12-23 DIAGNOSIS — Z419 Encounter for procedure for purposes other than remedying health state, unspecified: Secondary | ICD-10-CM | POA: Diagnosis not present

## 2020-01-19 ENCOUNTER — Telehealth: Payer: Self-pay

## 2020-01-19 NOTE — Telephone Encounter (Signed)
If I have not had contact with the patient before, need that before I can give any recommendations. [facility]

## 2020-01-19 NOTE — Telephone Encounter (Signed)
She is a former patient of Dr. Sanda Klein and has not been seen since.  1st available was with Dr. Roxan Hockey.  She states got 1st covid shot and had a reaction of chest pain.  Wants to know if it is ok to recieve 2nd dose?  I told her it would be up to her but Kristeen Miss usually reacquired an appointment to discuss vaccine.  Not sure how he operates? Does he just suggest she get it or requires appt to address concerns?  She is schedule for Thursday afternoon

## 2020-01-21 ENCOUNTER — Ambulatory Visit: Payer: Self-pay | Admitting: Internal Medicine

## 2020-01-22 DIAGNOSIS — Z419 Encounter for procedure for purposes other than remedying health state, unspecified: Secondary | ICD-10-CM | POA: Diagnosis not present

## 2020-02-22 DIAGNOSIS — Z419 Encounter for procedure for purposes other than remedying health state, unspecified: Secondary | ICD-10-CM | POA: Diagnosis not present

## 2020-03-23 DIAGNOSIS — Z419 Encounter for procedure for purposes other than remedying health state, unspecified: Secondary | ICD-10-CM | POA: Diagnosis not present

## 2020-03-29 ENCOUNTER — Encounter: Payer: Self-pay | Admitting: Internal Medicine

## 2020-04-18 ENCOUNTER — Encounter: Payer: Self-pay | Admitting: Family Medicine

## 2020-04-18 ENCOUNTER — Other Ambulatory Visit: Payer: Self-pay

## 2020-04-18 ENCOUNTER — Ambulatory Visit (INDEPENDENT_AMBULATORY_CARE_PROVIDER_SITE_OTHER): Payer: 59 | Admitting: Family Medicine

## 2020-04-18 VITALS — BP 108/72 | HR 92 | Temp 98.0°F | Resp 18 | Ht 60.0 in | Wt 134.8 lb

## 2020-04-18 DIAGNOSIS — E559 Vitamin D deficiency, unspecified: Secondary | ICD-10-CM | POA: Diagnosis not present

## 2020-04-18 DIAGNOSIS — E785 Hyperlipidemia, unspecified: Secondary | ICD-10-CM | POA: Insufficient documentation

## 2020-04-18 DIAGNOSIS — J309 Allergic rhinitis, unspecified: Secondary | ICD-10-CM

## 2020-04-18 DIAGNOSIS — Z0000 Encounter for general adult medical examination without abnormal findings: Secondary | ICD-10-CM | POA: Diagnosis not present

## 2020-04-18 DIAGNOSIS — G43109 Migraine with aura, not intractable, without status migrainosus: Secondary | ICD-10-CM

## 2020-04-18 DIAGNOSIS — Z23 Encounter for immunization: Secondary | ICD-10-CM

## 2020-04-18 DIAGNOSIS — J4520 Mild intermittent asthma, uncomplicated: Secondary | ICD-10-CM

## 2020-04-18 DIAGNOSIS — R4589 Other symptoms and signs involving emotional state: Secondary | ICD-10-CM | POA: Insufficient documentation

## 2020-04-18 DIAGNOSIS — Z124 Encounter for screening for malignant neoplasm of cervix: Secondary | ICD-10-CM

## 2020-04-18 DIAGNOSIS — Z30011 Encounter for initial prescription of contraceptive pills: Secondary | ICD-10-CM

## 2020-04-18 DIAGNOSIS — M79621 Pain in right upper arm: Secondary | ICD-10-CM

## 2020-04-18 DIAGNOSIS — F418 Other specified anxiety disorders: Secondary | ICD-10-CM | POA: Insufficient documentation

## 2020-04-18 DIAGNOSIS — Z8349 Family history of other endocrine, nutritional and metabolic diseases: Secondary | ICD-10-CM

## 2020-04-18 MED ORDER — BLISOVI 24 FE 1-20 MG-MCG(24) PO TABS: 1.0000 | ORAL_TABLET | Freq: Every day | ORAL | 4 refills | Status: DC

## 2020-04-18 NOTE — Patient Instructions (Signed)
Health Maintenance  Topic Date Due  . Pap Smear  04/30/2020  . Tetanus Vaccine  11/05/2028  . Flu Shot  Completed  .  Hepatitis C: One time screening is recommended by Center for Disease Control  (CDC) for  adults born from [birth year] through 1966.   Completed  . HIV Screening  Completed     Preventive Care 30-30 Years Old, Female Preventive care refers to visits with your health care provider and lifestyle choices that can promote health and wellness. This includes:  A yearly physical exam. This may also be called an annual well check.  Regular dental visits and eye exams.  Immunizations.  Screening for certain conditions.  Healthy lifestyle choices, such as eating a healthy diet, getting regular exercise, not using drugs or products that contain nicotine and tobacco, and limiting alcohol use. What can I expect for my preventive care visit? Physical exam Your health care provider will check your:  Height and weight. This may be used to calculate body mass index (BMI), which tells if you are at a healthy weight.  Heart rate and blood pressure.  Skin for abnormal spots. Counseling Your health care provider may ask you questions about your:  Alcohol, tobacco, and drug use.  Emotional well-being.  Home and relationship well-being.  Sexual activity.  Eating habits.  Work and work Statistician.  Method of birth control.  Menstrual cycle.  Pregnancy history. What immunizations do I need?  Influenza (flu) vaccine  This is recommended every year. Tetanus, diphtheria, and pertussis (Tdap) vaccine  You may need a Td booster every 10 years. Varicella (chickenpox) vaccine  You may need this if you have not been vaccinated. Human papillomavirus (HPV) vaccine  If recommended by your health care provider, you may need three doses over 6 months. Measles, mumps, and rubella (MMR) vaccine  You may need at least one dose of MMR. You may also need a second dose. Meningococcal  conjugate (MenACWY) vaccine  One dose is recommended if you are age 30-21 years and a first-year college student living in a residence hall, or if you have one of several medical conditions. You may also need additional booster doses. Pneumococcal conjugate (PCV13) vaccine  You may need this if you have certain conditions and were not previously vaccinated. Pneumococcal polysaccharide (PPSV23) vaccine  You may need one or two doses if you smoke cigarettes or if you have certain conditions. Hepatitis A vaccine  You may need this if you have certain conditions or if you travel or work in places where you may be exposed to hepatitis A. Hepatitis B vaccine  You may need this if you have certain conditions or if you travel or work in places where you may be exposed to hepatitis B. Haemophilus influenzae type b (Hib) vaccine  You may need this if you have certain conditions. You may receive vaccines as individual doses or as more than one vaccine together in one shot (combination vaccines). Talk with your health care provider about the risks and benefits of combination vaccines. What tests do I need?  Blood tests  Lipid and cholesterol levels. These may be checked every 5 years starting at age 30.  Hepatitis C test.  Hepatitis B test. Screening  Diabetes screening. This is done by checking your blood sugar (glucose) after you have not eaten for a while (fasting).  Sexually transmitted disease (STD) testing.  BRCA-related cancer screening. This may be done if you have a family history of breast, ovarian, tubal, or  peritoneal cancers.  Pelvic exam and Pap test. This may be done every 3 years starting at age 30. Starting at age 65, this may be done every 5 years if you have a Pap test in combination with an HPV test. Talk with your health care provider about your test results, treatment options, and if necessary, the need for more tests. Follow these instructions at home: Eating and  drinking   Eat a diet that includes fresh fruits and vegetables, whole grains, lean protein, and low-fat dairy.  Take vitamin and mineral supplements as recommended by your health care provider.  Do not drink alcohol if: ? Your health care provider tells you not to drink. ? You are pregnant, may be pregnant, or are planning to become pregnant.  If you drink alcohol: ? Limit how much you have to 0-1 drink a day. ? Be aware of how much alcohol is in your drink. In the [place] one drink equals one 12 oz bottle of beer (355 mL), one 5 oz glass of wine (148 mL), or one 1 oz glass of hard liquor (44 mL). Lifestyle  Take daily care of your teeth and gums.  Stay active. Exercise for at least 30 minutes on 5 or more days each week.  Do not use any products that contain nicotine or tobacco, such as cigarettes, e-cigarettes, and chewing tobacco. If you need help quitting, ask your health care provider.  If you are sexually active, practice safe sex. Use a condom or other form of birth control (contraception) in order to prevent pregnancy and STIs (sexually transmitted infections). If you plan to become pregnant, see your health care provider for a preconception visit. What's next?  Visit your health care provider once a year for a well check visit.  Ask your health care provider how often you should have your eyes and teeth checked.  Stay up to date on all vaccines. This information is not intended to replace advice given to you by your health care provider. Make sure you discuss any questions you have with your health care provider. Document Revised: 12/19/2017 Document Reviewed: 12/19/2017 Elsevier Patient Education  2020 [employer].

## 2020-04-18 NOTE — Progress Notes (Signed)
Patient: Hayley Hunt, Female    DOB: [birth year], 29 y.o.   MRN: [id number] Delsa Grana, PA-C Visit Date: 04/18/2020  Today's Provider: Delsa Grana, PA-C   Chief Complaint  Patient presents with  . Annual Exam   Subjective:   Annual physical exam:  Hayley Hunt is a 29 y.o. female who presents today for complete physical exam:  Exercise/Activity:  Four days a week 60 min  Diet/nutrition:  Eats everything- eats generally healthy Sleep:  Sleeps well, still tired when she wakes up - normal for her  [employer] employee - wants all her labs checked    Pt had multiple concerns today -   Vit D - hx of deficiency - wants rechecked  Right axilla pain, felt a bump? Has been pushing there, slightly tender x 2 d, no breast/nipple change, no hx of breast CA in family  Hx of abnormal pap in the past - last was normal in 2019, chronic BV/vaginal odor with multiple drug allergies - using boric acid to tx  Wants thyroid and autoimmune thyroid labs checked Wants vitamins checked - [employer] employee and about to lose lab benefits end of the year  Sexual health - no concerns other than she wants to go back on OCP - she was on a med previously that she loved - Dr. Sanda Klein stopped it and put her on a different med which caused heavy bleeding, GYN told her to stop, Dr. Sanda Klein said OCP was stroke risk for her due to migraines - OBGYN and Neurology cleared her to be on OCPs, pt does not want depo shot, cannot get into obgyn for a few months - does not want to get pregnant, currently not on any birth control    USPSTF grade A and B recommendations - reviewed and addressed today  Depression:  Phq 9 completed today by patient, was reviewed by me with patient in the room PHQ score is neg, pt feels good PHQ 2/9 Scores 04/18/2020 10/14/2018 05/01/2018 10/21/2017  PHQ - 2 Score 0 0 0 0  PHQ- 9 Score - 0 0 -   Depression screen [facility] 2/9 04/18/2020 10/14/2018 05/01/2018 10/21/2017 06/26/2017  Decreased Interest 0 0  0 0 0  Down, Depressed, Hopeless 0 0 0 0 0  PHQ - 2 Score 0 0 0 0 0  Altered sleeping - 0 0 - -  Tired, decreased energy - 0 0 - -  Change in appetite - 0 0 - -  Feeling bad or failure about yourself  - 0 0 - -  Trouble concentrating - 0 0 - -  Moving slowly or fidgety/restless - 0 0 - -  Suicidal thoughts - 0 0 - -  PHQ-9 Score - 0 0 - -  Difficult doing work/chores - Not difficult at all Not difficult at all - -   Alcohol screening: [place] Office Visit from 04/18/2020 in [facility]  AUDIT-C Score 1     Immunizations and Health Maintenance: Health Maintenance  Topic Date Due  . PAP SMEAR-Modifier  04/30/2020  . TETANUS/TDAP  11/05/2028  . INFLUENZA VACCINE  Completed  . Hepatitis C Screening  Completed  . HIV Screening  Completed     Hep C Screening: done in the past  STD testing and prevention (HIV/chl/gon/syphilis):  see above, no additional testing desired by pt today  Intimate partner violence: denies  Sexual History/Pain during Intercourse: married   Menstrual History/LMP/Abnormal Bleeding: no concerns Patient's last menstrual period  was 04/11/2020.  Incontinence Symptoms: denies   Breast cancer: not due per age Last Mammogram: *see HM list above BRCA gene screening: no known family hx  Cervical cancer screening: UTD - due next month Pt denies family hx of cancers - breast, ovarian, uterine, colon:     Osteoporosis:  N/a per ageDiscussion on osteoporosis per age, including high calcium and vitamin D supplementation, weight bearing exercises  Skin cancer:  Hx of skin CA -  NO, previously saw dermatology and had moles removed - precancerous, sees every 6 months - [facility] Discussed atypical lesions   Colorectal cancer:   Colonoscopy - has done in the past for sx, not due currently for age or for CRC screening Discussed concerning signs and sx of CRC, pt denies melena, hematochezia   Lung cancer:   Low Dose CT Chest  recommended if Age 2-80 years, 30 pack-year currently smoking OR have quit w/in 15years. Patient does not qualify.    Social History   Tobacco Use  . Smoking status: Never Smoker  . Smokeless tobacco: Never Used  Vaping Use  . Vaping Use: Never used  Substance Use Topics  . Alcohol use: No    Alcohol/week: 0.0 standard drinks  . Drug use: No     Flowsheet Row Office Visit from 04/18/2020 in [facility]  AUDIT-C Score 1      Family History  Problem Relation Age of Onset  . Asthma Mother   . Hypothyroidism Mother   . Lupus Mother   . Osteoporosis Maternal Grandmother   . Heart attack Maternal Grandfather   . Heart disease Maternal Grandfather   . Cancer Paternal Grandfather        lung  . Breast cancer Neg Hx   . Ovarian cancer Neg Hx      Blood pressure/Hypertension: BP Readings from Last 3 Encounters:  04/18/20 108/72  06/16/19 (!) 106/58  02/08/19 (!) 95/57    Weight/Obesity: Wt Readings from Last 3 Encounters:  04/18/20 134 lb 12.8 oz (61.1 kg)  06/16/19 127 lb 6 oz (57.8 kg)  02/06/19 142 lb 4.8 oz (64.5 kg)   BMI Readings from Last 3 Encounters:  04/18/20 26.33 kg/m  06/16/19 24.88 kg/m  02/06/19 27.79 kg/m     Lipids:  Lab Results  Component Value Date   CHOL 178 05/01/2018   CHOL 193 04/30/2017   CHOL 199 04/27/2016   Lab Results  Component Value Date   HDL 47 05/01/2018   HDL 42 04/30/2017   HDL 50 04/27/2016   Lab Results  Component Value Date   LDLCALC 118 (H) 05/01/2018   LDLCALC 126 (H) 04/30/2017   LDLCALC 133 (H) 04/27/2016   Lab Results  Component Value Date   TRIG 64 05/01/2018   TRIG 126 04/30/2017   TRIG 79 04/27/2016   Lab Results  Component Value Date   CHOLHDL 3.8 05/01/2018   CHOLHDL 4.6 (H) 04/30/2017   CHOLHDL 4.0 04/27/2016   No results found for: LDLDIRECT Based on the results of lipid panel his/her cardiovascular risk factor ( using [place] )  in the next 10 years is: The ASCVD  Risk score Mikey Bussing DC Jr., et al., 2013) failed to calculate for the following reasons:   The 2013 ASCVD risk score is only valid for ages 81 to 15 Glucose:  Glucose  Date Value Ref Range Status  05/01/2018 82 65 - 99 mg/dL Final  08/27/2011 91 65 - 99 mg/dL Final  Glucose, Bld  Date Value Ref Range Status  01/10/2019 96 70 - 99 mg/dL Final  06/16/2018 94 70 - 99 mg/dL Final  09/28/2017 114 (H) 65 - 99 mg/dL Final   Hypertension: BP Readings from Last 3 Encounters:  04/18/20 108/72  06/16/19 (!) 106/58  02/08/19 (!) 95/57   Obesity: Wt Readings from Last 3 Encounters:  04/18/20 134 lb 12.8 oz (61.1 kg)  06/16/19 127 lb 6 oz (57.8 kg)  02/06/19 142 lb 4.8 oz (64.5 kg)   BMI Readings from Last 3 Encounters:  04/18/20 26.33 kg/m  06/16/19 24.88 kg/m  02/06/19 27.79 kg/m     Advanced Care Planning:  A voluntary discussion about advance care planning including the explanation and discussion of advance directives.   Discussed health care proxy and Living will, and the patient was able to identify a health care proxy as mom - Kem Parkinson.   Patient does not have a living will at present time.   Social History      She        Social History   Socioeconomic History  . Marital status: Married    Spouse name: Not on file  . Number of children: 1  . Years of education: 81  . Highest education level: Associate degree: academic program  Occupational History  . Not on file  Tobacco Use  . Smoking status: Never Smoker  . Smokeless tobacco: Never Used  Vaping Use  . Vaping Use: Never used  Substance and Sexual Activity  . Alcohol use: No    Alcohol/week: 0.0 standard drinks  . Drug use: No  . Sexual activity: Yes    Partners: Male    Birth control/protection: None    Comment: none  Other Topics Concern  . Not on file  Social History Narrative  . Not on file   Social Determinants of Health   Financial Resource Strain: Low Risk   . Difficulty of Paying Living  Expenses: Not hard at all  Food Insecurity: No Food Insecurity  . Worried About Charity fundraiser in the Last Year: Never true  . Ran Out of Food in the Last Year: Never true  Transportation Needs: No Transportation Needs  . Lack of Transportation (Medical): No  . Lack of Transportation (Non-Medical): No  Physical Activity: Sufficiently Active  . Days of Exercise per Week: 4 days  . Minutes of Exercise per Session: 60 min  Stress: Stress Concern Present  . Feeling of Stress : To some extent  Social Connections: Moderately Isolated  . Frequency of Communication with Friends and Family: More than three times a week  . Frequency of Social Gatherings with Friends and Family: Twice a week  . Attends Religious Services: Never  . Active Member of Clubs or Organizations: No  . Attends Archivist Meetings: Never  . Marital Status: Married    Family History        Family History  Problem Relation Age of Onset  . Asthma Mother   . Hypothyroidism Mother   . Lupus Mother   . Osteoporosis Maternal Grandmother   . Heart attack Maternal Grandfather   . Heart disease Maternal Grandfather   . Cancer Paternal Grandfather        lung  . Breast cancer Neg Hx   . Ovarian cancer Neg Hx     Patient Active Problem List   Diagnosis Date Noted  . Hyperlipidemia 04/18/2020  . Anxiety about health 04/18/2020  . Vitamin D  deficiency 05/01/2018  . Gallbladder sludge 05/07/2017  . Laryngopharyngeal reflux (LPR) 12/25/2015  . Asthma, mild 09/13/2015  . Allergic rhinitis 11/22/2014  . Dermatitis, eczematoid 11/22/2014    Past Surgical History:  Procedure Laterality Date  . FLEXIBLE SIGMOIDOSCOPY  03/24/2018   Procedure: FLEXIBLE SIGMOIDOSCOPY;  Surgeon: Lin Landsman, MD;  Location: [facility] ENDOSCOPY;  Service: Gastroenterology;;  . Arnetha Courser TOOTH EXTRACTION       Current Outpatient Medications:  .  albuterol (VENTOLIN HFA) 108 (90 Base) MCG/ACT inhaler, Inhale 1-2 puffs into the  lungs every 6 (six) hours as needed for wheezing or shortness of breath. , Disp: , Rfl:  .  Norethindrone Acetate-Ethinyl Estrad-FE (BLISOVI 24 FE) 1-20 MG-MCG(24) tablet, Take 1 tablet by mouth daily., Disp: 84 tablet, Rfl: 4 .  Ubrogepant (UBRELVY) 50 MG TABS, Take 50 mg by mouth as needed (may repeat once in 2 hours. no more than 2 pills in 24 h.)., Disp: 10 tablet, Rfl: 3  Allergies  Allergen Reactions  . Amoxicillin Hives and Rash  . Clindamycin/Lincomycin Rash  . Metronidazole Itching, Other (See Comments) and Rash    Mouth swelling  . Penicillins Anaphylaxis and Hives  . Lactose Intolerance (Gi)   . Latex     Patient Care Team: Delsa Grana, PA-C as PCP - General (Family Medicine) Sanda Klein Satira Anis, MD as Attending Physician (Family Medicine) Bary Castilla Forest Gleason, MD as Consulting Physician (General Surgery)  Review of Systems  10 Systems reviewed and are negative for acute change except as noted in the HPI.   I personally reviewed active problem list, medication list, allergies, family history, social history, health maintenance, notes from last encounter, lab results, imaging with the patient/caregiver today.        Objective:   Vitals:  Vitals:   04/18/20 1509  BP: 108/72  Pulse: 92  Resp: 18  Temp: 98 F (36.7 C)  TempSrc: Oral  SpO2: 98%  Weight: 134 lb 12.8 oz (61.1 kg)  Height: 5' (1.524 m)    Body mass index is 26.33 kg/m.  Physical Exam Vitals and nursing note reviewed. Exam conducted with a chaperone present.  Constitutional:      General: She is not in acute distress.    Appearance: Normal appearance. She is well-developed and normal weight. She is not ill-appearing, toxic-appearing or diaphoretic.     Interventions: Face mask in place.  HENT:     Head: Normocephalic and atraumatic.     Right Ear: External ear normal.     Left Ear: External ear normal.     Nose: Nose normal.     Mouth/Throat:     Mouth: Mucous membranes are moist.  Eyes:      General: Lids are normal. No scleral icterus.       Right eye: No discharge.        Left eye: No discharge.     Conjunctiva/sclera: Conjunctivae normal.     Pupils: Pupils are equal, round, and reactive to light.  Neck:     Thyroid: No thyroid mass, thyromegaly or thyroid tenderness.     Trachea: Trachea and phonation normal. No tracheal deviation.  Cardiovascular:     Rate and Rhythm: Normal rate and regular rhythm.     Pulses: Normal pulses.          Radial pulses are 2+ on the right side and 2+ on the left side.       Posterior tibial pulses are 2+ on the right side and 2+ on  the left side.     Heart sounds: Normal heart sounds. No murmur heard. No friction rub. No gallop.   Pulmonary:     Effort: Pulmonary effort is normal. No respiratory distress.     Breath sounds: Normal breath sounds. No stridor. No wheezing, rhonchi or rales.  Chest:     Chest wall: No tenderness.  Breasts:     Right: Normal. No swelling, bleeding, inverted nipple, mass, nipple discharge, skin change, tenderness, axillary adenopathy or supraclavicular adenopathy.     Left: Normal. No swelling, bleeding, inverted nipple, mass, nipple discharge or skin change.    Abdominal:     General: Bowel sounds are normal. There is no distension.     Palpations: Abdomen is soft.     Tenderness: There is no abdominal tenderness. There is no right CVA tenderness, left CVA tenderness or guarding.  Genitourinary:    General: Normal vulva.     Vagina: Normal.     Cervix: Normal.     Uterus: Normal.      Adnexa: Right adnexa normal and left adnexa normal.     Comments: PAP/HPV obtained, bimanual done Musculoskeletal:     Cervical back: Normal range of motion and neck supple.     Right lower leg: No edema.     Left lower leg: No edema.  Lymphadenopathy:     Cervical: No cervical adenopathy.     Right cervical: No superficial, deep or posterior cervical adenopathy.    Left cervical: No superficial, deep or posterior  cervical adenopathy.     Upper Body:     Right upper body: No supraclavicular, axillary or pectoral adenopathy.     Left upper body: No pectoral adenopathy.  Skin:    General: Skin is warm and dry.     Coloration: Skin is not jaundiced or pale.     Findings: No rash.  Neurological:     Mental Status: She is alert.     Motor: No abnormal muscle tone.     Gait: Gait normal.  Psychiatric:        Attention and Perception: Attention normal.        Mood and Affect: Mood is anxious. Mood is not depressed.        Speech: Speech normal.        Behavior: Behavior normal. Behavior is cooperative.       Fall Risk: Fall Risk  04/18/2020 10/14/2018 05/01/2018 10/21/2017 06/26/2017  Falls in the past year? 0 0 0 No No  Number falls in past yr: 0 0 0 - -  Injury with Fall? 0 0 0 - -  Follow up Falls evaluation completed - - - -    Functional Status Survey: Is the patient deaf or have difficulty hearing?: No Does the patient have difficulty seeing, even when wearing glasses/contacts?: No Does the patient have difficulty concentrating, remembering, or making decisions?: No Does the patient have difficulty walking or climbing stairs?: No Does the patient have difficulty dressing or bathing?: No Does the patient have difficulty doing errands alone such as visiting a doctor's office or shopping?: No   Assessment & Plan:    CPE completed today  . USPSTF grade A and B recommendations reviewed with patient; age-appropriate recommendations, preventive care, screening tests, etc discussed and encouraged; healthy living encouraged; see AVS for patient education given to patient  . Discussed importance of 150 minutes of physical activity weekly, AHA exercise recommendations given to pt in AVS/handout  . Discussed importance of  healthy diet:  eating lean meats and proteins, avoiding trans fats and saturated fats, avoid simple sugars and excessive carbs in diet, eat 6 servings of fruit/vegetables daily and  drink plenty of water and avoid sweet beverages.    . Recommended pt to do annual eye exam and routine dental exams/cleanings  . Depression, alcohol, fall screening completed as documented above and per flowsheets  . Reviewed Health Maintenance: Health Maintenance  Topic Date Due  . PAP SMEAR-Modifier  04/30/2020  . TETANUS/TDAP  11/05/2028  . INFLUENZA VACCINE  Completed  . Hepatitis C Screening  Completed  . HIV Screening  Completed    . Immunizations: Immunization History  Administered Date(s) Administered  . Influenza,inj,Quad PF,6+ Mos 04/02/2014, 04/04/2015, 02/01/2017, 05/01/2018, 02/08/2019, 04/18/2020  . Tdap 08/28/2016, 11/06/2018      ICD-10-CM   1. Adult general medical exam  Z00.00 CBC with Differential/Platelet    Lipid panel    Comprehensive metabolic panel  2. Vitamin D deficiency  E55.9 VITAMIN D 25 Hydroxy (Vit-D Deficiency, Fractures)  3. Allergic rhinitis, unspecified seasonality, unspecified trigger  J30.9   4. Mild intermittent asthma without complication  Y86.57    well controlled  5. Hyperlipidemia, unspecified hyperlipidemia type  E78.5 Lipid panel    Comprehensive metabolic panel   recheck labs  6. Migraine with aura and without status migrainosus, not intractable  G43.109    mild migraines not on meds, did consult with neurologist, only one headache every 6 months or so  7. Screening for malignant neoplasm of cervix  Z12.4 Cytology - PAP   hx of abnormal pap, last was 04/2017 and pap was normal  8. Need for influenza vaccination  Z23 Flu Vaccine QUAD 6+ mos PF IM (Fluarix Quad PF)  9. Family history of thyroid disease in mother  Z40.49 Thyroid Panel With TSH   pt asked for all thyroid testing and antibody testing to be done - no current new sx, but family hx, reviewed TSH and other testing   10. Encounter for initial prescription of contraceptive pills  Z30.011 Norethindrone Acetate-Ethinyl Estrad-FE (BLISOVI 24 FE) 1-20 MG-MCG(24) tablet   reviewed  risk/benefit, previously cleared by neurology for OCP w/o concern for increased stroke risk, pt desired to restart med she took previously   83. Anxiety about health  F41.8   12. Axillary tenderness, right  M79.621    exam unremarkable, monitor, if still sx in 3-4 weeks encouraged pt to f/up and imaging may be indicated       Delsa Grana, PA-C 04/18/20 4:18 PM  [facility]

## 2020-04-20 LAB — CBC WITH DIFFERENTIAL/PLATELET
Basophils Absolute: 0.1 10*3/uL (ref 0.0–0.2)
Basos: 1 %
EOS (ABSOLUTE): 0.2 10*3/uL (ref 0.0–0.4)
Eos: 2 %
Hematocrit: 42.1 % (ref 34.0–46.6)
Hemoglobin: 13.7 g/dL (ref 11.1–15.9)
Immature Grans (Abs): 0 10*3/uL (ref 0.0–0.1)
Immature Granulocytes: 0 %
Lymphocytes Absolute: 1.8 10*3/uL (ref 0.7–3.1)
Lymphs: 26 %
MCH: 29 pg (ref 26.6–33.0)
MCHC: 32.5 g/dL (ref 31.5–35.7)
MCV: 89 fL (ref 79–97)
Monocytes Absolute: 0.7 10*3/uL (ref 0.1–0.9)
Monocytes: 10 %
Neutrophils Absolute: 4.2 10*3/uL (ref 1.4–7.0)
Neutrophils: 61 %
Platelets: 212 10*3/uL (ref 150–450)
RBC: 4.73 x10E6/uL (ref 3.77–5.28)
RDW: 13.2 % (ref 11.7–15.4)
WBC: 6.8 10*3/uL (ref 3.4–10.8)

## 2020-04-20 LAB — COMPREHENSIVE METABOLIC PANEL
ALT: 12 IU/L (ref 0–32)
AST: 17 IU/L (ref 0–40)
Albumin/Globulin Ratio: 2.1 (ref 1.2–2.2)
Albumin: 4.6 g/dL (ref 3.9–5.0)
Alkaline Phosphatase: 75 IU/L (ref 44–121)
BUN/Creatinine Ratio: 18 (ref 9–23)
BUN: 12 mg/dL (ref 6–20)
Bilirubin Total: <0.2 mg/dL (ref 0.0–1.2)
CO2: 20 mmol/L (ref 20–29)
Calcium: 9.2 mg/dL (ref 8.7–10.2)
Chloride: 99 mmol/L (ref 96–106)
Creatinine, Ser: 0.67 mg/dL (ref 0.57–1.00)
GFR calc Af Amer: 136 mL/min/{1.73_m2} (ref >59)
GFR calc non Af Amer: 118 mL/min/{1.73_m2} (ref >59)
Globulin, Total: 2.2 g/dL (ref 1.5–4.5)
Glucose: 92 mg/dL (ref 65–99)
Potassium: 4 mmol/L (ref 3.5–5.2)
Sodium: 138 mmol/L (ref 134–144)
Total Protein: 6.8 g/dL (ref 6.0–8.5)

## 2020-04-20 LAB — VITAMIN D 25 HYDROXY (VIT D DEFICIENCY, FRACTURES): Vit D, 25-Hydroxy: 19.9 ng/mL — ABNORMAL LOW (ref 30.0–100.0)

## 2020-04-20 LAB — LIPID PANEL
Chol/HDL Ratio: 3.4 ratio (ref 0.0–4.4)
Cholesterol, Total: 169 mg/dL (ref 100–199)
HDL: 50 mg/dL (ref >39)
LDL Chol Calc (NIH): 105 mg/dL — ABNORMAL HIGH (ref 0–99)
Triglycerides: 74 mg/dL (ref 0–149)
VLDL Cholesterol Cal: 14 mg/dL (ref 5–40)

## 2020-04-20 LAB — THYROID PANEL WITH TSH
Free Thyroxine Index: 2.1 (ref 1.2–4.9)
T3 Uptake Ratio: 27 % (ref 24–39)
T4, Total: 7.6 ug/dL (ref 4.5–12.0)
TSH: 2.96 u[IU]/mL (ref 0.450–4.500)

## 2020-04-23 DIAGNOSIS — Z419 Encounter for procedure for purposes other than remedying health state, unspecified: Secondary | ICD-10-CM | POA: Diagnosis not present

## 2020-04-26 NOTE — Telephone Encounter (Signed)
This encounter was created in error - please disregard.

## 2020-04-29 ENCOUNTER — Encounter: Payer: Self-pay | Admitting: Internal Medicine

## 2020-05-02 ENCOUNTER — Encounter: Payer: Self-pay | Admitting: Family Medicine

## 2020-05-24 DIAGNOSIS — Z419 Encounter for procedure for purposes other than remedying health state, unspecified: Secondary | ICD-10-CM | POA: Diagnosis not present

## 2020-06-21 DIAGNOSIS — Z419 Encounter for procedure for purposes other than remedying health state, unspecified: Secondary | ICD-10-CM | POA: Diagnosis not present

## 2020-07-22 DIAGNOSIS — Z419 Encounter for procedure for purposes other than remedying health state, unspecified: Secondary | ICD-10-CM | POA: Diagnosis not present

## 2020-08-21 DIAGNOSIS — Z419 Encounter for procedure for purposes other than remedying health state, unspecified: Secondary | ICD-10-CM | POA: Diagnosis not present

## 2020-09-21 DIAGNOSIS — Z419 Encounter for procedure for purposes other than remedying health state, unspecified: Secondary | ICD-10-CM | POA: Diagnosis not present

## 2020-10-21 DIAGNOSIS — Z419 Encounter for procedure for purposes other than remedying health state, unspecified: Secondary | ICD-10-CM | POA: Diagnosis not present

## 2020-11-22 DIAGNOSIS — Z20822 Contact with and (suspected) exposure to covid-19: Secondary | ICD-10-CM | POA: Diagnosis not present

## 2020-11-22 DIAGNOSIS — Z03818 Encounter for observation for suspected exposure to other biological agents ruled out: Secondary | ICD-10-CM | POA: Diagnosis not present

## 2020-12-15 DIAGNOSIS — Z03818 Encounter for observation for suspected exposure to other biological agents ruled out: Secondary | ICD-10-CM | POA: Diagnosis not present

## 2020-12-15 DIAGNOSIS — Z20822 Contact with and (suspected) exposure to covid-19: Secondary | ICD-10-CM | POA: Diagnosis not present

## 2021-01-13 ENCOUNTER — Ambulatory Visit: Payer: Self-pay

## 2021-01-13 NOTE — Telephone Encounter (Signed)
Pt has a swollen pump under her right arm/ pt has had this for 3-4 days/ soonest appt was Monday morning/ please advise  Reason for Disposition  [1] Small swelling or lump AND [2] unexplained AND [3] present > 1 week  Answer Assessment - Initial Assessment Questions 1. APPEARANCE of SWELLING: "What does it look like?" (e.g., lymph node, insect bite, mole)     Lump 2. SIZE: "How large is the swelling?" (e.g., inches, cm; or compare to size of pinhead, tip of pen, eraser, coin, pea, grape, ping pong ball)      1 inch 3. LOCATION: "Where is the swelling located?"     Right axilla 4. ONSET: "When did the swelling start?"     4 days ago 5. PAIN: "Is it painful?" If Yes, ask: "How much?"     No 6. ITCH: "Does it itch?" If Yes, ask: "How much?"     No 7. CAUSE: "What do you think caused the swelling?"     Unsure 8. OTHER SYMPTOMS: "Do you have any other symptoms?" (e.g., fever)     Sinus infection last week  Protocols used: Skin Lump or Localized Swelling-A-AH

## 2021-01-13 NOTE — Telephone Encounter (Signed)
Pt. Has a lump to right axilla 1 inch diameter. No pain. Has had a sinus infection last week. Requesting appointment today. Warm transfer to [facility] in the practice.

## 2021-01-13 NOTE — Telephone Encounter (Signed)
Pt is scheduled for Monday

## 2021-01-13 NOTE — Telephone Encounter (Signed)
Spoke to pt, she has [employer] so I was unable to schedule her with [facility]. I advised her to go to [facility] if she felt she couldn't wait til Monday to see Dr Ky Barban. I also told pt that if we get a cancellation today we would call her. She was appreciative of my time and thank me

## 2021-01-16 ENCOUNTER — Ambulatory Visit: Payer: Managed Care, Other (non HMO) | Admitting: Nurse Practitioner

## 2021-01-16 ENCOUNTER — Encounter: Payer: Self-pay | Admitting: Nurse Practitioner

## 2021-01-16 ENCOUNTER — Other Ambulatory Visit: Payer: Self-pay

## 2021-01-16 VITALS — BP 120/70 | HR 95 | Temp 98.2°F | Resp 16 | Ht 60.0 in | Wt 136.4 lb

## 2021-01-16 DIAGNOSIS — L049 Acute lymphadenitis, unspecified: Secondary | ICD-10-CM | POA: Diagnosis not present

## 2021-01-16 MED ORDER — DOXYCYCLINE HYCLATE 100 MG PO TABS: 100.0000 mg | ORAL_TABLET | Freq: Two times a day (BID) | ORAL | 0 refills | Status: AC

## 2021-01-16 NOTE — Progress Notes (Signed)
BP 120/70   Pulse 95   Temp 98.2 F (36.8 C) (Oral)   Resp 16   Ht 5' (1.524 m)   Wt 136 lb 6.4 oz (61.9 kg)   LMP 12/26/2020   SpO2 99%   BMI 26.64 kg/m    Subjective:    Patient ID: Hayley Hunt, female    DOB: Jun 22, 1990, 30 y.o.   MRN: [id number]  HPI: Hayley Hunt is a 30 y.o. female, here alone  Chief Complaint  Patient presents with   Mass    Under right arm pit for 1 week   Lymphadenitis: She says that she has a lump on her right arm pit.  She says she had a cold a two weeks ago.  She noticed the lump on Tuesday.  She denies any drainage, heat, fever or pain.  She says it is just there.  Discussed getting lab work, starting antibiotic and follow-up in one week if no better.  She verbalized understanding and is agreeable to plan.   Relevant past medical, surgical, family and social history reviewed and updated as indicated. Interim medical history since our last visit reviewed. Allergies and medications reviewed and updated.  Review of Systems  Constitutional: Negative for fever or weight change.  Respiratory: Negative for cough and shortness of breath.   Cardiovascular: Negative for chest pain or palpitations.  Gastrointestinal: Negative for abdominal pain, no bowel changes.  Musculoskeletal: Negative for gait problem or joint swelling.  Skin: Negative for rash. Positive for lump right arm pit Neurological: Negative for dizziness or headache.  No other specific complaints in a complete review of systems (except as listed in HPI above).      Objective:    BP 120/70   Pulse 95   Temp 98.2 F (36.8 C) (Oral)   Resp 16   Ht 5' (1.524 m)   Wt 136 lb 6.4 oz (61.9 kg)   LMP 12/26/2020   SpO2 99%   BMI 26.64 kg/m   Wt Readings from Last 3 Encounters:  01/16/21 136 lb 6.4 oz (61.9 kg)  04/18/20 134 lb 12.8 oz (61.1 kg)  06/16/19 127 lb 6 oz (57.8 kg)    Physical Exam   Constitutional: Patient appears well-developed and well-nourished. No distress.   Eyes: Conjunctivae and EOM are normal. Pupils are equal, round, and reactive to light. No scleral icterus.  Neck: Normal range of motion. Neck supple. No JVD present. No thyromegaly present.  Cardiovascular: Normal rate, regular rhythm and normal heart sounds.  No murmur heard. No BLE edema. Pulmonary/Chest: Effort normal and breath sounds normal. No respiratory distress. Abdominal: Soft. Bowel sounds are normal, no distension. There is no tenderness. no masses Breast: no lumps or masses, no nipple discharge or rashes Right arm pit: lymphadenitis 2 cm x 3 cm Musculoskeletal: Normal range of motion, no joint effusions. No gross deformities Neurological: he is alert and oriented to person, place, and time. No cranial nerve deficit. Coordination, balance, strength, speech and gait are normal.  Skin: Skin is warm and dry. No rash noted. No erythema.  Psychiatric: Patient has a normal mood and affect. behavior is normal. Judgment and thought content normal.   Results for orders placed or performed in visit on 04/18/20  CBC with Differential/Platelet  Result Value Ref Range   WBC 6.8 3.4 - 10.8 x10E3/uL   RBC 4.73 3.77 - 5.28 x10E6/uL   Hemoglobin 13.7 11.1 - 15.9 g/dL   Hematocrit 42.1 34.0 - 46.6 %  MCV 89 79 - 97 fL   MCH 29.0 26.6 - 33.0 pg   MCHC 32.5 31.5 - 35.7 g/dL   RDW 13.2 11.7 - 15.4 %   Platelets 212 150 - 450 x10E3/uL   Neutrophils 61 Not Estab. %   Lymphs 26 Not Estab. %   Monocytes 10 Not Estab. %   Eos 2 Not Estab. %   Basos 1 Not Estab. %   Neutrophils Absolute 4.2 1.4 - 7.0 x10E3/uL   Lymphocytes Absolute 1.8 0.7 - 3.1 x10E3/uL   Monocytes Absolute 0.7 0.1 - 0.9 x10E3/uL   EOS (ABSOLUTE) 0.2 0.0 - 0.4 x10E3/uL   Basophils Absolute 0.1 0.0 - 0.2 x10E3/uL   Immature Granulocytes 0 Not Estab. %   Immature Grans (Abs) 0.0 0.0 - 0.1 x10E3/uL  Lipid panel  Result Value Ref Range   Cholesterol, Total 169 100 - 199 mg/dL   Triglycerides 74 0 - 149 mg/dL   HDL 50 >39  mg/dL   VLDL Cholesterol Cal 14 5 - 40 mg/dL   LDL Chol Calc (NIH) 105 (H) 0 - 99 mg/dL   Chol/HDL Ratio 3.4 0.0 - 4.4 ratio  VITAMIN D 25 Hydroxy (Vit-D Deficiency, Fractures)  Result Value Ref Range   Vit D, 25-Hydroxy 19.9 (L) 30.0 - 100.0 ng/mL  Comprehensive metabolic panel  Result Value Ref Range   Glucose 92 65 - 99 mg/dL   BUN 12 6 - 20 mg/dL   Creatinine, Ser 0.67 0.57 - 1.00 mg/dL   GFR calc non Af Amer 118 >59 mL/min/1.73   GFR calc Af Amer 136 >59 mL/min/1.73   BUN/Creatinine Ratio 18 9 - 23   Sodium 138 134 - 144 mmol/L   Potassium 4.0 3.5 - 5.2 mmol/L   Chloride 99 96 - 106 mmol/L   CO2 20 20 - 29 mmol/L   Calcium 9.2 8.7 - 10.2 mg/dL   Total Protein 6.8 6.0 - 8.5 g/dL   Albumin 4.6 3.9 - 5.0 g/dL   Globulin, Total 2.2 1.5 - 4.5 g/dL   Albumin/Globulin Ratio 2.1 1.2 - 2.2   Bilirubin Total <0.2 0.0 - 1.2 mg/dL   Alkaline Phosphatase 75 44 - 121 IU/L   AST 17 0 - 40 IU/L   ALT 12 0 - 32 IU/L  Thyroid Panel With TSH  Result Value Ref Range   TSH 2.960 0.450 - 4.500 uIU/mL   T4, Total 7.6 4.5 - 12.0 ug/dL   T3 Uptake Ratio 27 24 - 39 %   Free Thyroxine Index 2.1 1.2 - 4.9      Assessment & Plan:   1. Lymphadenitis, acute  - CBC with Differential/Platelet - C-reactive protein - Sed Rate (ESR) - doxycycline (VIBRA-TABS) 100 MG tablet; Take 1 tablet (100 mg total) by mouth 2 (two) times daily for 7 days.  Dispense: 14 tablet; Refill: 0   Follow up plan: Return in about 1 week (around 01/23/2021).

## 2021-01-17 LAB — CBC WITH DIFFERENTIAL/PLATELET
Absolute Monocytes: 414 cells/uL (ref 200–950)
Basophils Absolute: 62 cells/uL (ref 0–200)
Basophils Relative: 1.1 %
Eosinophils Absolute: 151 cells/uL (ref 15–500)
Eosinophils Relative: 2.7 %
HCT: 39.8 % (ref 35.0–45.0)
Hemoglobin: 13.3 g/dL (ref 11.7–15.5)
Lymphs Abs: 2050 cells/uL (ref 850–3900)
MCH: 29.6 pg (ref 27.0–33.0)
MCHC: 33.4 g/dL (ref 32.0–36.0)
MCV: 88.4 fL (ref 80.0–100.0)
MPV: 11 fL (ref 7.5–12.5)
Monocytes Relative: 7.4 %
Neutro Abs: 2923 cells/uL (ref 1500–7800)
Neutrophils Relative %: 52.2 %
Platelets: 243 10*3/uL (ref 140–400)
RBC: 4.5 10*6/uL (ref 3.80–5.10)
RDW: 12.5 % (ref 11.0–15.0)
Total Lymphocyte: 36.6 %
WBC: 5.6 10*3/uL (ref 3.8–10.8)

## 2021-01-17 LAB — SEDIMENTATION RATE: Sed Rate: 2 mm/h (ref 0–20)

## 2021-01-17 LAB — C-REACTIVE PROTEIN: CRP: 2.1 mg/L (ref <8.0)

## 2021-01-25 ENCOUNTER — Other Ambulatory Visit: Payer: Self-pay

## 2021-01-25 ENCOUNTER — Ambulatory Visit (INDEPENDENT_AMBULATORY_CARE_PROVIDER_SITE_OTHER): Payer: Managed Care, Other (non HMO) | Admitting: Nurse Practitioner

## 2021-01-25 ENCOUNTER — Encounter: Payer: Self-pay | Admitting: Nurse Practitioner

## 2021-01-25 VITALS — BP 118/72 | HR 85 | Temp 98.0°F | Resp 16 | Ht 60.0 in | Wt 137.7 lb

## 2021-01-25 DIAGNOSIS — L049 Acute lymphadenitis, unspecified: Secondary | ICD-10-CM | POA: Diagnosis not present

## 2021-01-25 NOTE — Progress Notes (Signed)
BP 118/72   Pulse 85   Temp 98 F (36.7 C) (Oral)   Resp 16   Ht 5' (1.524 m)   Wt 137 lb 11.2 oz (62.5 kg)   LMP 12/26/2020   SpO2 99%   BMI 26.89 kg/m    Subjective:    Patient ID: Hayley Hunt, female    DOB: 23-Mar-1991, 30 y.o.   MRN: [id number]  HPI: Hayley Hunt is a 30 y.o. female, here alone  Chief Complaint  Patient presents with   Follow-up   Lymphadenitis:  She says that she still has a lump on her right arm pit.  She noticed the lump on 01/10/21.  Was seen 01/16/21, we did lab work, which was normal and did a trial of antibiotics, which failed.  Lymph node has not changed in size.  It is still not painful or red. No drainage noted. She denies any fever or chills. Referral placed to general surgery for biopsy.  Relevant past medical, surgical, family and social history reviewed and updated as indicated. Interim medical history since our last visit reviewed. Allergies and medications reviewed and updated.  Review of Systems  Constitutional: Negative for fever or weight change.  Respiratory: Negative for cough and shortness of breath.   Cardiovascular: Negative for chest pain or palpitations.  Gastrointestinal: Negative for abdominal pain, no bowel changes.  Musculoskeletal: Negative for gait problem or joint swelling.  Skin: Negative for rash.  Neurological: Negative for dizziness or headache.  No other specific complaints in a complete review of systems (except as listed in HPI above).      Objective:    BP 118/72   Pulse 85   Temp 98 F (36.7 C) (Oral)   Resp 16   Ht 5' (1.524 m)   Wt 137 lb 11.2 oz (62.5 kg)   LMP 12/26/2020   SpO2 99%   BMI 26.89 kg/m   Wt Readings from Last 3 Encounters:  01/25/21 137 lb 11.2 oz (62.5 kg)  01/16/21 136 lb 6.4 oz (61.9 kg)  04/18/20 134 lb 12.8 oz (61.1 kg)    Physical Exam  Constitutional: Patient appears well-developed and well-nourished. No distress.  HEENT: head atraumatic, normocephalic, pupils equal  and reactive to light,  neck supple Cardiovascular: Normal rate, regular rhythm and normal heart sounds.  No murmur heard. No BLE edema. Pulmonary/Chest: Effort normal and breath sounds normal. No respiratory distress.  Right axilla:  Lump noted on right axilla, no redness noted Abdominal: Soft.  There is no tenderness. Psychiatric: Patient has a anxious mood and affect. behavior is normal. Judgment and thought content normal.   Results for orders placed or performed in visit on 01/16/21  CBC with Differential/Platelet  Result Value Ref Range   WBC 5.6 3.8 - 10.8 Thousand/uL   RBC 4.50 3.80 - 5.10 Million/uL   Hemoglobin 13.3 11.7 - 15.5 g/dL   HCT 39.8 35.0 - 45.0 %   MCV 88.4 80.0 - 100.0 fL   MCH 29.6 27.0 - 33.0 pg   MCHC 33.4 32.0 - 36.0 g/dL   RDW 12.5 11.0 - 15.0 %   Platelets 243 140 - 400 Thousand/uL   MPV 11.0 7.5 - 12.5 fL   Neutro Abs 2,923 1,500 - 7,800 cells/uL   Lymphs Abs 2,050 850 - 3,900 cells/uL   Absolute Monocytes 414 200 - 950 cells/uL   Eosinophils Absolute 151 15 - 500 cells/uL   Basophils Absolute 62 0 - 200 cells/uL   Neutrophils Relative %  52.2 %   Total Lymphocyte 36.6 %   Monocytes Relative 7.4 %   Eosinophils Relative 2.7 %   Basophils Relative 1.1 %  C-reactive protein  Result Value Ref Range   CRP 2.1 <8.0 mg/L  Sed Rate (ESR)  Result Value Ref Range   Sed Rate 2 0 - 20 mm/h      Assessment & Plan:   1. Lymphadenitis, acute  - Ambulatory referral to General Surgery   Follow up plan: Return if symptoms worsen or fail to improve.

## 2021-01-26 ENCOUNTER — Ambulatory Visit (INDEPENDENT_AMBULATORY_CARE_PROVIDER_SITE_OTHER): Payer: Medicaid Other | Admitting: Surgery

## 2021-01-26 ENCOUNTER — Encounter: Payer: Self-pay | Admitting: Surgery

## 2021-01-26 ENCOUNTER — Other Ambulatory Visit: Payer: Self-pay

## 2021-01-26 ENCOUNTER — Ambulatory Visit: Payer: Self-pay

## 2021-01-26 VITALS — BP 109/72 | HR 99 | Temp 98.9°F | Ht 60.0 in | Wt 135.0 lb

## 2021-01-26 DIAGNOSIS — R2231 Localized swelling, mass and lump, right upper limb: Secondary | ICD-10-CM | POA: Diagnosis not present

## 2021-01-26 NOTE — Progress Notes (Signed)
Patient ID: Hayley Hunt, female   DOB: 01-17-1991, 30 y.o.   MRN: [id number]  Chief Complaint: Right axillary change  History of Present Illness Hayley Hunt is a 30 y.o. female with an acute change in the right axilla noted on appearance, then palpated by patient.  Treated coincidentally for sinus infection with antibiotics, trusting perhaps lymphadenitis/lymphadenopathy will improve with same.  No appreciable change per patient.  No other work-up obtained.  Presents today for evaluation.  Past Medical History Past Medical History:  Diagnosis Date   Allergic asthma without acute exacerbation or status asthmaticus    Asthma, mild 09/13/2015   Common ichthyosis    Eczema    GERD (gastroesophageal reflux disease)    H/O abnormal cervical Papanicolaou smear 11/22/2014   Repeat PAP on 04/04/15 normal. [facility] saw patient for LGSIL PAP with normal Colpo exam with biopsy in 2015 - 2016    History of abnormal cervical Pap smear    Ovarian cyst    Vitamin D deficiency       Past Surgical History:  Procedure Laterality Date   FLEXIBLE SIGMOIDOSCOPY  03/24/2018   Procedure: FLEXIBLE SIGMOIDOSCOPY;  Surgeon: Lin Landsman, MD;  Location: [facility] ENDOSCOPY;  Service: Gastroenterology;;   WISDOM TOOTH EXTRACTION      Allergies  Allergen Reactions   Amoxicillin Hives and Rash   Clindamycin/Lincomycin Rash   Metronidazole Itching, Other (See Comments) and Rash    Mouth swelling   Penicillins Anaphylaxis and Hives   Lactose Intolerance (Gi)    Latex     Current Outpatient Medications  Medication Sig Dispense Refill   albuterol (VENTOLIN HFA) 108 (90 Base) MCG/ACT inhaler Inhale 1-2 puffs into the lungs every 6 (six) hours as needed for wheezing or shortness of breath.      No current facility-administered medications for this visit.    Family History Family History  Problem Relation Age of Onset   Asthma Mother    Hypothyroidism Mother    Lupus Mother    Osteoporosis  Maternal Grandmother    Heart attack Maternal Grandfather    Heart disease Maternal Grandfather    Cancer Paternal Grandfather        lung   Breast cancer Neg Hx    Ovarian cancer Neg Hx       Social History Social History   Tobacco Use   Smoking status: Never   Smokeless tobacco: Never  Vaping Use   Vaping Use: Never used  Substance Use Topics   Alcohol use: No    Alcohol/week: 0.0 standard drinks   Drug use: No        Review of Systems  Constitutional: Negative.   HENT: Negative.    Eyes: Negative.   Respiratory: Negative.    Cardiovascular: Negative.   Gastrointestinal: Negative.   Genitourinary: Negative.   Skin: Negative.   Neurological: Negative.   Psychiatric/Behavioral:  The patient is nervous/anxious.      Physical Exam Blood pressure 109/72, pulse 99, temperature 98.9 F (37.2 C), temperature source Oral, height 5' (1.524 m), weight 135 lb (61.2 kg), SpO2 99 %, unknown if currently breastfeeding. Last Weight  Most recent update: 01/26/2021  3:24 PM    Weight  61.2 kg (135 lb)             CONSTITUTIONAL: Well developed, and nourished, appropriately responsive and aware without distress.   EYES: Sclera non-icteric.   EARS, NOSE, MOUTH AND THROAT: Mask worn.    Hearing is intact to  voice.  NECK: Trachea is midline, and there is no jugular venous distension.  LYMPH NODES:  Lymph nodes in the neck are not enlarged. RESPIRATORY:  Lungs are clear, and breath sounds are equal bilaterally. Normal respiratory effort without pathologic use of accessory muscles. CARDIOVASCULAR: Heart is regular in rate and rhythm. GI: The abdomen is  soft, nontender, and nondistended.  GU: Breast/axillary exam.  Bilateral breast exam is without dominant nodularity nor suspicious masses.  I do not appreciate any particular abnormality with either axilla, and there may be a mild asymmetry of some soft tissue changes especially over the more brachial component of the axilla,  clearly not immediately oriented process.  I do not appreciate any axillary lymphadenopathy on exam, there are no distinct or suspicious masses or nodularity. MUSCULOSKELETAL:  Symmetrical muscle tone appreciated in all four extremities.    SKIN: Skin turgor is normal. No pathologic skin lesions appreciated.  NEUROLOGIC:  Motor and sensation appear grossly normal.  Cranial nerves are grossly without defect. PSYCH:  Alert and oriented to person, place and time. Affect is appropriate for situation.  Data Reviewed I have personally reviewed what is currently available of the patient's imaging, recent labs and medical records.   Labs:  CBC Latest Ref Rng & Units 01/16/2021 04/18/2020 02/07/2019  WBC 3.8 - 10.8 Thousand/uL 5.6 6.8 12.1(H)  Hemoglobin 11.7 - 15.5 g/dL 13.3 13.7 9.6(L)  Hematocrit 35.0 - 45.0 % 39.8 42.1 27.4(L)  Platelets 140 - 400 Thousand/uL 243 212 163   CMP Latest Ref Rng & Units 04/18/2020 01/10/2019 06/16/2018  Glucose 65 - 99 mg/dL 92 96 94  BUN 6 - 20 mg/dL 12 6 8   Creatinine 0.57 - 1.00 mg/dL 0.67 0.51 0.68  Sodium 134 - 144 mmol/L 138 136 133(L)  Potassium 3.5 - 5.2 mmol/L 4.0 3.6 3.3(L)  Chloride 96 - 106 mmol/L 99 105 103  CO2 20 - 29 mmol/L 20 22 22   Calcium 8.7 - 10.2 mg/dL 9.2 8.6(L) 8.7(L)  Total Protein 6.0 - 8.5 g/dL 6.8 5.5(L) 6.0(L)  Total Bilirubin 0.0 - 1.2 mg/dL <0.2 0.5 0.9  Alkaline Phos 44 - 121 IU/L 75 104 40  AST 0 - 40 IU/L 17 16 19   ALT 0 - 32 IU/L 12 15 21       Imaging:  Within last 24 hrs: No results found.  Assessment    Asymmetric axillae with soft tissue changes, acute in onset per patient's perspective. Patient Active Problem List   Diagnosis Date Noted   Hyperlipidemia 04/18/2020   Anxiety about health 04/18/2020   Vitamin D deficiency 05/01/2018   Gallbladder sludge 05/07/2017   Laryngopharyngeal reflux (LPR) 12/25/2015   Asthma, mild 09/13/2015   Allergic rhinitis 11/22/2014   Dermatitis, eczematoid 11/22/2014     Plan    We will obtain right axillary ultrasound with breast evaluation, unfortunately that will require diagnostic mammography as well. I anticipate a normal study, and therefore will likely call the patient with the results and not schedule follow-up visit.  I will be glad to see the patient again should there be any unexpected findings or additional concerns.  Face-to-face time spent with the patient and accompanying care providers(if present) was 30 minutes, with more than 50% of the time spent counseling, educating, and coordinating care of the patient.    These notes generated with voice recognition software. I apologize for typographical errors.  Ronny Bacon M.D., FACS 01/26/2021, 4:19 PM

## 2021-01-26 NOTE — Patient Instructions (Addendum)
Ultrasound and Mammogram is scheduled 02/03/21 @ 1:40 at [facility].  Dr. Christian Mate will call you with the results.   Breast Self-Awareness Breast self-awareness means being familiar with how your breasts look and feel. It involves checking your breasts regularly and reporting any changes to your health care provider. Practicing breast self-awareness is important. Sometimes changes may not be harmful (are benign), but sometimes a change in your breasts can be a sign of a serious medical problem. It is important to learn how to do this procedure correctly so that you can catch problems early, when treatment is more likely to be successful. All women should practice breast self-awareness, including women who have had breast implants. What you need: A mirror. A well-lit room. How to do a breast self-exam A breast self-exam is one way to learn what is normal for your breasts and whether your breasts are changing. To do a breast self-exam: Look for changes  Remove all the clothing above your waist. Stand in front of a mirror in a room with good lighting. Put your hands on your hips. Push your hands firmly downward. Compare your breasts in the mirror. Look for differences between them (asymmetry), such as: Differences in shape. Differences in size. Puckers, dips, and bumps in one breast and not the other. Look at each breast for changes in the skin, such as: Redness. Scaly areas. Look for changes in your nipples, such as: Discharge. Bleeding. Dimpling. Redness. A change in position. Feel for changes Carefully feel your breasts for lumps and changes. It is best to do this while lying on your back on the floor, and again while sitting or standing in the tub or shower with soapy water on your skin. Feel each breast in the following way: Place the arm on the side of the breast you are examining above your head. Feel your breast with the other hand. Start in the nipple area and  make -inch (2 cm) overlapping circles to feel your breast. Use the pads of your three middle fingers to do this. Apply light pressure, then medium pressure, then firm pressure. The light pressure will allow you to feel the tissue closest to the skin. The medium pressure will allow you to feel the tissue that is a little deeper. The firm pressure will allow you to feel the tissue close to the ribs. Continue the overlapping circles, moving downward over the breast until you feel your ribs below your breast. Move one finger-width toward the center of the body. Continue to use the -inch (2 cm) overlapping circles to feel your breast as you move slowly up toward your collarbone. Continue the up-and-down exam using all three pressures until you reach your armpit.  Write down what you find Writing down what you find can help you remember what to discuss with your health care provider. Write down: What is normal for each breast. Any changes that you find in each breast, including: The kind of changes you find. Any pain or tenderness. Size and location of any lumps. Where you are in your menstrual cycle, if you are still menstruating. General tips and recommendations Examine your breasts every month. If you are breastfeeding, the best time to examine your breasts is after a feeding or after using a breast pump. If you menstruate, the best time to examine your breasts is 5-7 days after your period. Breasts are generally lumpier during menstrual periods, and it may be more difficult to notice changes. With time and practice, you  will become more familiar with the variations in your breasts and more comfortable with the exam. Contact a health care provider if you: See a change in the shape or size of your breasts or nipples. See a change in the skin of your breast or nipples, such as a reddened or scaly area. Have unusual discharge from your nipples. Find a lump or thick area that was not there  before. Have pain in your breasts. Have any concerns related to your breast health. Summary Breast self-awareness includes looking for physical changes in your breasts, as well as feeling for any changes within your breasts. Breast self-awareness should be performed in front of a mirror in a well-lit room. You should examine your breasts every month. If you menstruate, the best time to examine your breasts is 5-7 days after your menstrual period. Let your health care provider know of any changes you notice in your breasts, including changes in size, changes on the skin, pain or tenderness, or unusual fluid from your nipples. This information is not intended to replace advice given to you by your health care provider. Make sure you discuss any questions you have with your health care provider. Document Revised: 11/26/2017 Document Reviewed: 11/26/2017 Elsevier Patient Education  [place].

## 2021-02-03 ENCOUNTER — Ambulatory Visit
Admission: RE | Admit: 2021-02-03 | Discharge: 2021-02-03 | Disposition: A | Discharge status: Home / Self Care | Payer: Managed Care, Other (non HMO) | Source: Ambulatory Visit | Attending: Surgery | Admitting: Surgery

## 2021-02-03 ENCOUNTER — Other Ambulatory Visit: Payer: Self-pay

## 2021-02-03 DIAGNOSIS — R2231 Localized swelling, mass and lump, right upper limb: Secondary | ICD-10-CM

## 2021-04-20 ENCOUNTER — Encounter: Payer: Self-pay | Admitting: Internal Medicine

## 2021-07-19 ENCOUNTER — Emergency Department
Admission: EM | Admit: 2021-07-19 | Discharge: 2021-07-19 | Disposition: A | Discharge status: Home / Self Care | Payer: Medicaid Other | Attending: Emergency Medicine | Admitting: Emergency Medicine

## 2021-07-19 ENCOUNTER — Emergency Department: Payer: Medicaid Other

## 2021-07-19 ENCOUNTER — Other Ambulatory Visit: Payer: Self-pay

## 2021-07-19 ENCOUNTER — Ambulatory Visit: Payer: Self-pay | Admitting: *Deleted

## 2021-07-19 DIAGNOSIS — R000 Tachycardia, unspecified: Secondary | ICD-10-CM | POA: Diagnosis not present

## 2021-07-19 DIAGNOSIS — Z20822 Contact with and (suspected) exposure to covid-19: Secondary | ICD-10-CM | POA: Insufficient documentation

## 2021-07-19 DIAGNOSIS — J45909 Unspecified asthma, uncomplicated: Secondary | ICD-10-CM | POA: Insufficient documentation

## 2021-07-19 DIAGNOSIS — R0789 Other chest pain: Secondary | ICD-10-CM | POA: Diagnosis not present

## 2021-07-19 DIAGNOSIS — Z8616 Personal history of COVID-19: Secondary | ICD-10-CM | POA: Insufficient documentation

## 2021-07-19 DIAGNOSIS — R079 Chest pain, unspecified: Secondary | ICD-10-CM

## 2021-07-19 LAB — BASIC METABOLIC PANEL
Anion gap: 8 (ref 5–15)
BUN: 10 mg/dL (ref 6–20)
CO2: 23 mmol/L (ref 22–32)
Calcium: 8.9 mg/dL (ref 8.9–10.3)
Chloride: 107 mmol/L (ref 98–111)
Creatinine, Ser: 0.61 mg/dL (ref 0.44–1.00)
GFR, Estimated: >60 mL/min (ref >60)
Glucose, Bld: 109 mg/dL — ABNORMAL HIGH (ref 70–99)
Potassium: 3.6 mmol/L (ref 3.5–5.1)
Sodium: 138 mmol/L (ref 135–145)

## 2021-07-19 LAB — CBC
HCT: 39.5 % (ref 36.0–46.0)
Hemoglobin: 13.8 g/dL (ref 12.0–15.0)
MCH: 29.1 pg (ref 26.0–34.0)
MCHC: 34.9 g/dL (ref 30.0–36.0)
MCV: 83.3 fL (ref 80.0–100.0)
Platelets: 273 10*3/uL (ref 150–400)
RBC: 4.74 MIL/uL (ref 3.87–5.11)
RDW: 12.6 % (ref 11.5–15.5)
WBC: 6.5 10*3/uL (ref 4.0–10.5)
nRBC: 0 % (ref 0.0–0.2)

## 2021-07-19 LAB — TROPONIN I (HIGH SENSITIVITY)
Troponin I (High Sensitivity): <2 ng/L (ref <18)
Troponin I (High Sensitivity): <2 ng/L (ref <18)

## 2021-07-19 LAB — RESP PANEL BY RT-PCR (FLU A&B, COVID) ARPGX2
Influenza A by PCR: NEGATIVE
Influenza B by PCR: NEGATIVE
SARS Coronavirus 2 by RT PCR: NEGATIVE

## 2021-07-19 LAB — HCG, QUANTITATIVE, PREGNANCY: hCG, Beta Chain, Quant, S: <1 m[IU]/mL (ref <5)

## 2021-07-19 LAB — D-DIMER, QUANTITATIVE: D-Dimer, Quant: 0.33 ug/mL-FEU (ref 0.00–0.50)

## 2021-07-19 NOTE — ED Triage Notes (Signed)
Pt comes with c/o 3 weeks + of CP. Pt states it has gotten worse. Pt states pain to left sided pain. Pt states pain is 5/10. Pt states some nausea and now little anxiety from this. ?

## 2021-07-19 NOTE — ED Notes (Signed)
Blue top sent if needed 

## 2021-07-19 NOTE — Progress Notes (Signed)
Name: Hayley Hunt   MRN: [id number]    DOB: [birth year]   Date:07/20/2021 ? ?     Progress Note ? ?Subjective ? ?Chief Complaint ? ?Follow Up ? ?HPI ? ?Chest pain: she states she had COVID-12 May 2019 and since that time every time she gets sick she has chest pain , she had COVID-19 again Nov 2022. She states a few weeks ago she developed left ear pain and sore throat and had a virtual visit. She took Flonase and Claritin D and symptoms resolved but right after that chest pain returned. She states it happens daily and multiple episodes per day, described as sharp on left side of chest near sternum and once under left rib cage ( that pain improved with inhaler use) last night it was a heavy feeling and went to [facility] for evaluation. Studies including : D-dimer, Troponin, hCG and CBC that were normal. She told me she has health anxiety, she has a therapist but not recently. She was told in the past she needs to take medication for anxiety.  ? ?She states when the chest pain happens it triggers a panic attack.  ? ?The chest pain is not associated with nausea, vomiting, diaphoresis, jaw pain or arm pain. No family history of sudden death ? ?Symptoms of chest pain started when she was laid off and husband had a colonoscopy that showed pre-cancerous changes. She states stress is back to baseline but she is still having chest pain ? ?Patient Active Problem List  ? Diagnosis Date Noted  ? Mass of right axilla 01/26/2021  ? Hyperlipidemia 04/18/2020  ? Anxiety about health 04/18/2020  ? Vitamin D deficiency 05/01/2018  ? Gallbladder sludge 05/07/2017  ? Laryngopharyngeal reflux (LPR) 12/25/2015  ? Asthma, mild 09/13/2015  ? Allergic rhinitis 11/22/2014  ? Dermatitis, eczematoid 11/22/2014  ? ? ?Past Surgical History:  ?Procedure Laterality Date  ? FLEXIBLE SIGMOIDOSCOPY  03/24/2018  ? Procedure: FLEXIBLE SIGMOIDOSCOPY;  Surgeon: Lin Landsman, MD;  Location: [facility] ENDOSCOPY;  Service: Gastroenterology;;  ? WISDOM TOOTH  EXTRACTION    ? ? ?Family History  ?Problem Relation Age of Onset  ? Asthma Mother   ? Hypothyroidism Mother   ? Lupus Mother   ? Osteoporosis Maternal Grandmother   ? Heart attack Maternal Grandfather   ? Heart disease Maternal Grandfather   ? Cancer Paternal Grandfather   ?     lung  ? Breast cancer Neg Hx   ? Ovarian cancer Neg Hx   ? ? ?Social History  ? ?Tobacco Use  ? Smoking status: Never  ? Smokeless tobacco: Never  ?Substance Use Topics  ? Alcohol use: No  ?  Alcohol/week: 0.0 standard drinks  ? ? ? ?Current Outpatient Medications:  ?  albuterol (VENTOLIN HFA) 108 (90 Base) MCG/ACT inhaler, Inhale 1-2 puffs into the lungs every 6 (six) hours as needed for wheezing or shortness of breath. , Disp: , Rfl:  ? ?Allergies  ?Allergen Reactions  ? Amoxicillin Hives and Rash  ? Clindamycin/Lincomycin Rash  ? Metronidazole Itching, Other (See Comments) and Rash  ?  Mouth swelling  ? Penicillins Anaphylaxis and Hives  ? Lactose Intolerance (Gi)   ? Latex   ? ? ?I personally reviewed active problem list, medication list, allergies, family history, social history, health maintenance with the patient/caregiver today. ? ? ?ROS ? ?Constitutional: Negative for fever or weight change.  ?Respiratory: Negative for cough and shortness of breath.   ?Cardiovascular: Negative for chest pain or palpitations.  ?  Gastrointestinal: Negative for abdominal pain, no bowel changes.  ?Musculoskeletal: Negative for gait problem or joint swelling.  ?Skin: Negative for rash.  ?Neurological: Negative for dizziness or headache.  ?No other specific complaints in a complete review of systems (except as listed in HPI above).  ? ?Objective ? ?Vitals:  ? 07/20/21 1023  ?BP: 112/62  ?Pulse: 91  ?Resp: 16  ?SpO2: 100%  ?Weight: 137 lb (62.1 kg)  ?Height: 5' (1.524 m)  ? ? ?Body mass index is 26.76 kg/m?. ? ?Physical Exam ? ?Constitutional: Patient appears well-developed and well-nourished.  No distress.  ?HEENT: head atraumatic, normocephalic, pupils  equal and reactive to light, neck supple ?Cardiovascular: Normal rate, regular rhythm and normal heart sounds.  No murmur heard. No BLE edema.  ?Pulmonary/Chest: Effort normal and breath sounds normal. No respiratory distress. ?Abdominal: Soft.  There is no tenderness. ?Psychiatric: Patient has a normal mood and affect. behavior is normal. Judgment and thought content normal.  ? ?Recent Results (from the past 2160 hour(s))  ?Basic metabolic panel     Status: Abnormal  ? Collection Time: 07/19/21  9:14 AM  ?Result Value Ref Range  ? Sodium 138 135 - 145 mmol/L  ? Potassium 3.6 3.5 - 5.1 mmol/L  ? Chloride 107 98 - 111 mmol/L  ? CO2 23 22 - 32 mmol/L  ? Glucose, Bld 109 (H) 70 - 99 mg/dL  ?  Comment: Glucose reference range applies only to samples taken after fasting for at least 8 hours.  ? BUN 10 6 - 20 mg/dL  ? Creatinine, Ser 0.61 0.44 - 1.00 mg/dL  ? Calcium 8.9 8.9 - 10.3 mg/dL  ? GFR, Estimated >60 >60 mL/min  ?  Comment: (NOTE) ?Calculated using the CKD-EPI Creatinine Equation (2021) ?  ? Anion gap 8 5 - 15  ?  Comment: Performed at [facility], [address] [place], [place] [address]  ?CBC     Status: None  ? Collection Time: 07/19/21  9:14 AM  ?Result Value Ref Range  ? WBC 6.5 4.0 - 10.5 K/uL  ? RBC 4.74 3.87 - 5.11 MIL/uL  ? Hemoglobin 13.8 12.0 - 15.0 g/dL  ? HCT 39.5 36.0 - 46.0 %  ? MCV 83.3 80.0 - 100.0 fL  ? MCH 29.1 26.0 - 34.0 pg  ? MCHC 34.9 30.0 - 36.0 g/dL  ? RDW 12.6 11.5 - 15.5 %  ? Platelets 273 150 - 400 K/uL  ? nRBC 0.0 0.0 - 0.2 %  ?  Comment: Performed at [facility], [address] [place], [place] [address]  ?Troponin I (High Sensitivity)     Status: None  ? Collection Time: 07/19/21  9:14 AM  ?Result Value Ref Range  ? Troponin I (High Sensitivity) <2 <18 ng/L  ?  Comment: (NOTE) ?Elevated high sensitivity troponin I (hsTnI) values and significant  ?changes across serial measurements may suggest ACS but many other  ?chronic and acute conditions are known to  elevate hsTnI results.  ?Refer to the "Links" section for chest pain algorithms and additional  ?guidance. ?Performed at [facility], [place], ?[place] [address] ?  ?hCG, quantitative, pregnancy     Status: None  ? Collection Time: 07/19/21  9:14 AM  ?Result Value Ref Range  ? hCG, Beta Chain, Quant, S <1 <5 mIU/mL  ?  Comment:        ?  GEST. AGE      CONC.  (mIU/mL) ?  <=1 WEEK  5 - 50 ?    2 WEEKS       50 - 500 ?    3 WEEKS       100 - 10,000 ?    4 WEEKS     1,000 - 30,000 ?    5 WEEKS     3,500 - 115,000 ?  6-8 WEEKS     12,000 - 270,000 ?   12 WEEKS     15,000 - 220,000 ?       ?FEMALE AND NON-PREGNANT FEMALE: ?    LESS THAN 5 mIU/mL ?Performed at [facility], [address] [place], [place] [address] ?  ?D-dimer, quantitative     Status: None  ? Collection Time: 07/19/21 11:19 AM  ?Result Value Ref Range  ? D-Dimer, Quant 0.33 0.00 - 0.50 ug/mL-FEU  ?  Comment: (NOTE) ?At the manufacturer cut-off value of 0.5 ?g/mL FEU, this assay has a ?negative predictive value of 95-100%.This assay is intended for use ?in conjunction with a clinical pretest probability (PTP) assessment ?model to exclude pulmonary embolism (PE) and deep venous thrombosis ?(DVT) in outpatients suspected of PE or DVT. ?Results should be correlated with clinical presentation. ?Performed at [facility], [place], ?[place] [address] ?  ?Troponin I (High Sensitivity)     Status: None  ? Collection Time: 07/19/21 11:27 AM  ?Result Value Ref Range  ? Troponin I (High Sensitivity) <2 <18 ng/L  ?  Comment: (NOTE) ?Elevated high sensitivity troponin I (hsTnI) values and significant  ?changes across serial measurements may suggest ACS but many other  ?chronic and acute conditions are known to elevate hsTnI results.  ?Refer to the "Links" section for chest pain algorithms and additional  ?guidance. ?Performed at [facility], [place], ?[place] [address] ?   ?Resp Panel by RT-PCR (Flu A&B, Covid) Nasopharyngeal Swab     Status: None  ? Collection Time: 07/19/21 11:27 AM  ? Specimen: Nasopharyngeal Swab; Nasopharyngeal(NP) swabs in vial transport medium  ?Result V

## 2021-07-19 NOTE — ED Notes (Signed)
Computer in room not opening [facility] production. ? ?Was unable to print discharge consent. Pt gave verbal consent for discharge.  ?

## 2021-07-19 NOTE — Telephone Encounter (Signed)
Reason for Disposition ? [1] Chest pain lasts < 5 minutes AND [2] NO chest pain or cardiac symptoms (e.g., breathing difficulty, sweating) now (Exception: chest pains that last only a few seconds) ? ?Answer Assessment - Initial Assessment Questions ?1. LOCATION: "Where does it hurt?"   ?    Left side of chest ?2. RADIATION: "Does the pain go anywhere else?" (e.g., into neck, jaw, arms, back) ?    No radiation ?3. ONSET: "When did the chest pain begin?" (Minutes, hours or days)  ?    Few weeks ?4. PATTERN "Does the pain come and go, or has it been constant since it started?"  "Does it get worse with exertion?"  ?    Comes and goes, not worse with exertion ?5. DURATION: "How long does it last" (e.g., seconds, minutes, hours) ?    Shooting pain or tightness- 5 minutes or less ?6. SEVERITY: "How bad is the pain?"  (e.g., Scale 1-10; mild, moderate, or severe) ?   - MILD (1-3): doesn't interfere with normal activities  ?   - MODERATE (4-7): interferes with normal activities or awakens from sleep ?   - SEVERE (8-10): excruciating pain, unable to do any normal activities   ?    No pain now- moderate when occurs ?7. CARDIAC RISK FACTORS: "Do you have any history of heart problems or risk factors for heart disease?" (e.g., angina, prior heart attack; diabetes, high blood pressure, high cholesterol, smoker, or strong family history of heart disease) ?    no ?8. PULMONARY RISK FACTORS: "Do you have any history of lung disease?"  (e.g., blood clots in lung, asthma, emphysema, birth control pills) ?    asthma ?9. CAUSE: "What do you think is causing the chest pain?" ?    Inflammation around the chest- advised Ibuprofen ?10. OTHER SYMPTOMS: "Do you have any other symptoms?" (e.g., dizziness, nausea, vomiting, sweating, fever, difficulty breathing, cough) ?      Cough- anxiety with chest pain ?11. PREGNANCY: "Is there any chance you are pregnant?" "When was your last menstrual period?" ?      *No Answer* ? ?Protocols used: Chest  Pain-A-AH ? ?

## 2021-07-19 NOTE — ED Provider Notes (Addendum)
? ?[facility] ?Provider Note ? ? ? Event Date/Time  ? First MD Initiated Contact with Patient 07/19/21 1107   ?  (approximate) ? ? ?History  ? ?Chest Pain ? ? ?HPI ? ?Hayley Hunt is a 31 y.o. female with a past medical history of asthma, eczema, GERD and ovarian cyst who presents for evaluation of 3 weeks of intermittent but worsening chest discomfort.  Patient states it is more in the left chest.  He states it is being tickly more bothersome and persistent over the last 24 hours.  No prior similar episodes.  No clear leaving aggravating factors other than palpation of the left chest which seems to make it worse.  It is not pleuritic.  It is not positional or exertional.  No analgesia attempted prior to arrival.  No associated fevers but patient has had a slight cough.  She states couple weeks ago she had a sinus infection that she got over.  No current headache, earache, sore throat, nausea, vomiting, diarrhea, urine symptoms, back pain, abdominal pain rash or extremity pain.  She states she had COVID last year.  Denies tobacco abuse.  No other acute concerns at this time.  She does not wish for any analgesia at this time. ? ?  ?Past Medical History:  ?Diagnosis Date  ? Allergic asthma without acute exacerbation or status asthmaticus   ? Asthma, mild 09/13/2015  ? Common ichthyosis   ? Eczema   ? GERD (gastroesophageal reflux disease)   ? H/O abnormal cervical Papanicolaou smear 11/22/2014  ? Repeat PAP on 04/04/15 normal. [facility] saw patient for LGSIL PAP with normal Colpo exam with biopsy in 2015 - 2016   ? History of abnormal cervical Pap smear   ? Ovarian cyst   ? Vitamin D deficiency   ? ? ? ?Physical Exam  ?Triage Vital Signs: ?ED Triage Vitals  ?Enc Vitals Group  ?   BP 07/19/21 0913 114/72  ?   Pulse Rate 07/19/21 0913 (!) 111  ?   Resp 07/19/21 0913 18  ?   Temp 07/19/21 0913 98.3 ?F (36.8 ?C)  ?   Temp Source 07/19/21 0913 Oral  ?   SpO2 07/19/21 0913 100 %  ?   Weight --    ?   Height --   ?   Head Circumference --   ?   Peak Flow --   ?   Pain Score 07/19/21 0905 5  ?   Pain Loc --   ?   Pain Edu? --   ?   Excl. in [place]? --   ? ? ?Most recent vital signs: ?Vitals:  ? 07/19/21 0913 07/19/21 1132  ?BP: 114/72 107/76  ?Pulse: (!) 111 84  ?Resp: 18 16  ?Temp: 98.3 ?F (36.8 ?C)   ?SpO2: 100% 100%  ? ? ?General: Awake, no distress.  ?CV:  Good peripheral perfusion.  2+ radial pulses.  Slightly tachycardic.  No murmur. ?Resp:  Normal effort.  Clear bilaterally without any wheezing rhonchi rales or increased effort. ?Abd:  No distention.  Soft throughout. ?Other:  Pain is reproducible when patient is palpating her left chest. ? ? ?ED Results / Procedures / Treatments  ?Labs ?(all labs ordered are listed, but only abnormal results are displayed) ?Labs Reviewed  ?BASIC METABOLIC PANEL - Abnormal; Notable for the following components:  ?    Result Value  ? Glucose, Bld 109 (*)   ? All other components within normal limits  ?  RESP PANEL BY RT-PCR (FLU A&B, COVID) [id number]  ?CBC  ?HCG, QUANTITATIVE, PREGNANCY  ?D-DIMER, QUANTITATIVE  ?POC URINE PREG, ED  ?TROPONIN I (HIGH SENSITIVITY)  ?TROPONIN I (HIGH SENSITIVITY)  ? ? ? ?EKG ? ?ECG is remarkable for sinus tachycardia with a ventricular rate of 115, normal axis, unremarkable intervals without clear evidence of acute ischemia or significant arrhythmia. ? ? ?RADIOLOGY ?Chest reviewed by myself shows no focal consoidation, effusion, edema, pneumothorax or other clear acute thoracic process. I also reviewed radiology interpretation and agree with findings described. ? ? ? ?PROCEDURES: ? ?Critical Care performed: No ? ?Procedures ? ? ? ?MEDICATIONS ORDERED IN ED: ?Medications - No data to display ? ? ?IMPRESSION / MDM / ASSESSMENT AND PLAN / ED COURSE  ?I reviewed the triage vital signs and the nursing notes. ?             ?               ? ?Differential diagnosis includes, but is not limited to ACS, myocarditis, PE, pneumonia, bronchitis,  costochondritis, pleurisy with lower suspicion based on her history and exam for dissection or GI etiologies at this time.  I repeatedly offered analgesia on patient arrival that she is declining this at this time.  She also notes that it is making her feel more anxious and she is not sure if the pain is making her feel more anxious or if her anxiety is making her pain worse. ? ?ECG is remarkable for sinus tachycardia with a ventricular rate of 115, normal axis, unremarkable intervals without clear evidence of acute ischemia or significant arrhythmia.  Given undetectable troponin x2, I have a very low suspicion for ACS or myocarditis. ? ?Chest reviewed by myself shows no focal consoidation, effusion, edema, pneumothorax or other clear acute thoracic process. I also reviewed radiology interpretation and agree with findings described. ? ?CBC without leukocytosis or acute anemia.  BMP shows no significant electrolyte or metabolic derangements.  Low suspicion for PE as D-dimer 0.33.  COVID influenza PCR is negative. ? ?Overall unclear etiology for patient's symptoms although I suspect it may be related to some inflammation of the chest wall or pleurisy.  She is continuing declined any analgesia.  At this point think she is stable for discharge with outpatient follow-up.  Recommend she return to emergency room for any new or worsening of symptoms.   ?  ? ? ?FINAL CLINICAL IMPRESSION(S) / ED DIAGNOSES  ? ?Final diagnoses:  ?Chest pain, unspecified type  ? ? ? ?Rx / DC Orders  ? ?ED Discharge Orders   ? ? None  ? ?  ? ? ? ?Note:  This document was prepared using Dragon voice recognition software and may include unintentional dictation errors. ?  ?Lucrezia Starch, MD ?07/19/21 1237 ? ?  ?Lucrezia Starch, MD ?07/19/21 1246 ? ?

## 2021-07-19 NOTE — ED Notes (Signed)
[facility] not able to open at this time, will send blood with chart labels. ?

## 2021-07-19 NOTE — Telephone Encounter (Signed)
?  Chief Complaint: chest pain- was seen at ED this morning- calling for ED follow up ?Symptoms: no pain present now- left sided chest pain- comes and goes- less than 5 minutes ?Frequency: on/off- occurring few weeks now ?Pertinent Negatives: Patient denies dizziness, nausea, vomiting, sweating, fever, difficulty breathing, cough ?Disposition: '[]'$ ED /'[]'$ Urgent Care (no appt availability in office) / '[x]'$ Appointment(In office/virtual)/ '[]'$  [facility]/ '[]'$ Home Care/ '[]'$ Refused Recommended Disposition /'[]'$ [facility]/ '[]'$  Follow-up with PCP ?Additional Notes:    ?

## 2021-07-20 ENCOUNTER — Telehealth: Payer: Self-pay

## 2021-07-20 ENCOUNTER — Encounter: Payer: Self-pay | Admitting: Family Medicine

## 2021-07-20 ENCOUNTER — Ambulatory Visit: Payer: Medicaid Other | Admitting: Family Medicine

## 2021-07-20 VITALS — BP 112/62 | HR 91 | Resp 16 | Ht 60.0 in | Wt 137.0 lb

## 2021-07-20 DIAGNOSIS — R0789 Other chest pain: Secondary | ICD-10-CM

## 2021-07-20 DIAGNOSIS — F418 Other specified anxiety disorders: Secondary | ICD-10-CM

## 2021-07-20 DIAGNOSIS — Z23 Encounter for immunization: Secondary | ICD-10-CM

## 2021-07-20 MED ORDER — BUSPIRONE HCL 5 MG PO TABS: 5.0000 mg | ORAL_TABLET | Freq: Three times a day (TID) | ORAL | 0 refills | Status: DC

## 2021-07-20 NOTE — Patient Instructions (Signed)
Walking meditation: 3 things you see, 3 things you hear, 3 things you feel ?At night you can try: love and kindness medication ?May you be healthy ?May you be happy ?May you be safe  ?

## 2021-07-20 NOTE — Telephone Encounter (Signed)
Transition Care Management Follow-up Telephone Call ?Date of discharge and from where: 07/19/2021 from [facility] ?How have you been since you were released from the hospital? Patient stated that she is feeling the same and has an appt today with PCP.  ?Any questions or concerns? No ? ?Items Reviewed: ?Did the pt receive and understand the discharge instructions provided? Yes  ?Medications obtained and verified? Yes  ?Other? No  ?Any new allergies since your discharge? No  ?Dietary orders reviewed? No ?Do you have support at home? Yes  ? ?Functional Questionnaire: (I = Independent and D = Dependent) ?ADLs: I ? ?Bathing/Dressing- I ? ?Meal Prep- I ? ?Eating- I ? ?Maintaining continence- I ? ?Transferring/Ambulation- I ? ?Managing Meds- I ? ? ?Follow up appointments reviewed: ? ?PCP Hospital f/u appt confirmed? Yes  Appt today (Non [facility] PCP) ?[facility] f/u appt confirmed? No   ?Are transportation arrangements needed? No  ?If their condition worsens, is the pt aware to call PCP or go to the Emergency Dept.? Yes ?Was the patient provided with contact information for the PCP's office or ED? Yes ?Was to pt encouraged to call back with questions or concerns? Yes ? ?

## 2021-08-23 ENCOUNTER — Ambulatory Visit: Payer: BC Managed Care – PPO | Admitting: Family Medicine

## 2021-08-23 ENCOUNTER — Encounter: Payer: Self-pay | Admitting: Family Medicine

## 2021-08-23 VITALS — BP 110/64 | HR 96 | Temp 98.2°F | Resp 14 | Ht 60.0 in | Wt 135.9 lb

## 2021-08-23 DIAGNOSIS — F411 Generalized anxiety disorder: Secondary | ICD-10-CM | POA: Diagnosis not present

## 2021-08-23 DIAGNOSIS — F40298 Other specified phobia: Secondary | ICD-10-CM

## 2021-08-23 MED ORDER — HYDROXYZINE HCL 10 MG PO TABS: 10.0000 mg | ORAL_TABLET | Freq: Three times a day (TID) | ORAL | 0 refills | Status: DC | PRN

## 2021-08-23 NOTE — Progress Notes (Addendum)
Name: Hayley Hunt   MRN: [id number]    DOB: [birth year]   Date:08/23/2021 ? ?     Progress Note ? ?Subjective ? ?Chief Complaint ? ?Follow up  ? ?HPI ? ?GAD: she had her first biological child 2.5 years ago, developed pos-partum  depression, she went to see a therapist and was advised to see her OB. She was given a medication - she thinks it was a BZD, but it made her sleep and she did not go back. She was given buspar during her last visit with me in March but states it has made her more anxious - sensation that she is crawling out of her skin. She is afraid of getting sick and dying and now worries about her children, about husband, her parents. Her mind is always busy and worried. She feels on edge - noticed mostly by her husband She has gone to [facility] multiple times with chest pain. She has insurance again and will resume seeing a therapist  ? ?Gastroenteritis : last week, she is still jittery and nauseated intermittently, advised bland diet and stay hydrated  ? ?Patient Active Problem List  ? Diagnosis Date Noted  ? Mass of right axilla 01/26/2021  ? Hyperlipidemia 04/18/2020  ? Anxiety about health 04/18/2020  ? Vitamin D deficiency 05/01/2018  ? Gallbladder sludge 05/07/2017  ? Laryngopharyngeal reflux (LPR) 12/25/2015  ? Asthma, mild 09/13/2015  ? Allergic rhinitis 11/22/2014  ? Dermatitis, eczematoid 11/22/2014  ? ? ?Past Surgical History:  ?Procedure Laterality Date  ? FLEXIBLE SIGMOIDOSCOPY  03/24/2018  ? Procedure: FLEXIBLE SIGMOIDOSCOPY;  Surgeon: Lin Landsman, MD;  Location: [facility] ENDOSCOPY;  Service: Gastroenterology;;  ? WISDOM TOOTH EXTRACTION    ? ? ?Family History  ?Problem Relation Age of Onset  ? Asthma Mother   ? Hypothyroidism Mother   ? Lupus Mother   ? Osteoporosis Maternal Grandmother   ? Heart attack Maternal Grandfather   ? Heart disease Maternal Grandfather   ? Cancer Paternal Grandfather   ?     lung  ? Breast cancer Neg Hx   ? Ovarian cancer Neg Hx   ? ? ?Social History  ? ?Tobacco Use   ? Smoking status: Never  ? Smokeless tobacco: Never  ?Substance Use Topics  ? Alcohol use: No  ?  Alcohol/week: 0.0 standard drinks  ? ? ? ?Current Outpatient Medications:  ?  albuterol (VENTOLIN HFA) 108 (90 Base) MCG/ACT inhaler, Inhale 1-2 puffs into the lungs every 6 (six) hours as needed for wheezing or shortness of breath. , Disp: , Rfl:  ?  busPIRone (BUSPAR) 5 MG tablet, Take 1 tablet (5 mg total) by mouth 3 (three) times daily., Disp: 60 tablet, Rfl: 0 ? ?Allergies  ?Allergen Reactions  ? Amoxicillin Hives and Rash  ? Clindamycin/Lincomycin Rash  ? Metronidazole Itching, Other (See Comments) and Rash  ?  Mouth swelling  ? Penicillins Anaphylaxis and Hives  ? Lactose Intolerance (Gi)   ? Latex   ? ? ?I personally reviewed active problem list, medication list, allergies, family history, social history with the patient/caregiver today. ? ? ?ROS ? ?Constitutional: Negative for fever or weight change.  ?Respiratory: Negative for cough and shortness of breath.   ?Cardiovascular: Negative for chest pain or palpitations.  ?Gastrointestinal: Negative for abdominal pain, no bowel changes.  ?Musculoskeletal: Negative for gait problem or joint swelling.  ?Skin: Negative for rash.  ?Neurological: Negative for dizziness or headache.  ?No other specific complaints in a complete review of systems (  except as listed in HPI above).  ? ?Objective ? ?Vitals:  ? 08/23/21 1426  ?BP: 110/64  ?Pulse: 96  ?Resp: 14  ?Temp: 98.2 ?F (36.8 ?C)  ?TempSrc: Oral  ?SpO2: 100%  ?Weight: 135 lb 14.4 oz (61.6 kg)  ?Height: 5' (1.524 m)  ? ? ?Body mass index is 26.54 kg/m?. ? ?Physical Exam ? ?Constitutional: Patient appears well-developed and well-nourished. Overweight.  No distress.  ?HEENT: head atraumatic, normocephalic, pupils equal and reactive to light,, neck supple, very small subcutaneous nodule on chin - reassurance for now, non tender   ?Cardiovascular: Normal rate, regular rhythm and normal heart sounds.  No murmur heard. No BLE  edema. ?Pulmonary/Chest: Effort normal and breath sounds normal. No respiratory distress. ?Abdominal: Soft.  There is no tenderness. ?Psychiatric: Patient is anxious, pacing around the room , normal speech pattern, cooperative  ? ?Recent Results (from the past 2160 hour(s))  ?Basic metabolic panel     Status: Abnormal  ? Collection Time: 07/19/21  9:14 AM  ?Result Value Ref Range  ? Sodium 138 135 - 145 mmol/L  ? Potassium 3.6 3.5 - 5.1 mmol/L  ? Chloride 107 98 - 111 mmol/L  ? CO2 23 22 - 32 mmol/L  ? Glucose, Bld 109 (H) 70 - 99 mg/dL  ?  Comment: Glucose reference range applies only to samples taken after fasting for at least 8 hours.  ? BUN 10 6 - 20 mg/dL  ? Creatinine, Ser 0.61 0.44 - 1.00 mg/dL  ? Calcium 8.9 8.9 - 10.3 mg/dL  ? GFR, Estimated >60 >60 mL/min  ?  Comment: (NOTE) ?Calculated using the CKD-EPI Creatinine Equation (2021) ?  ? Anion gap 8 5 - 15  ?  Comment: Performed at [facility], [address], [place], [place] [address]  ?CBC     Status: None  ? Collection Time: 07/19/21  9:14 AM  ?Result Value Ref Range  ? WBC 6.5 4.0 - 10.5 K/uL  ? RBC 4.74 3.87 - 5.11 MIL/uL  ? Hemoglobin 13.8 12.0 - 15.0 g/dL  ? HCT 39.5 36.0 - 46.0 %  ? MCV 83.3 80.0 - 100.0 fL  ? MCH 29.1 26.0 - 34.0 pg  ? MCHC 34.9 30.0 - 36.0 g/dL  ? RDW 12.6 11.5 - 15.5 %  ? Platelets 273 150 - 400 K/uL  ? nRBC 0.0 0.0 - 0.2 %  ?  Comment: Performed at [facility], [address], [place], [place] [address]  ?Troponin I (High Sensitivity)     Status: None  ? Collection Time: 07/19/21  9:14 AM  ?Result Value Ref Range  ? Troponin I (High Sensitivity) <2 <18 ng/L  ?  Comment: (NOTE) ?Elevated high sensitivity troponin I (hsTnI) values and significant  ?changes across serial measurements may suggest ACS but many other  ?chronic and acute conditions are known to elevate hsTnI results.  ?Refer to the "Links" section for chest pain algorithms and additional  ?guidance. ?Performed at [facility], [place], ?[place] [address] ?  ?hCG, quantitative, pregnancy     Status: None  ? Collection Time: 07/19/21  9:14 AM  ?Result Value Ref Range  ? hCG, Beta Chain, Quant, S <1 <5 mIU/mL  ?  Comment:        ?  GEST. AGE      CONC.  (mIU/mL) ?  <=1 WEEK        5 - 50 ?    2 WEEKS  50 - 500 ?    3 WEEKS       100 - 10,000 ?    4 WEEKS     1,000 - 30,000 ?    5 WEEKS     3,500 - 115,000 ?  6-8 WEEKS     12,000 - 270,000 ?   12 WEEKS     15,000 - 220,000 ?       ?FEMALE AND NON-PREGNANT FEMALE: ?    LESS THAN 5 mIU/mL ?Performed at [facility], [address], [place], [place] [address] ?  ?D-dimer, quantitative     Status: None  ? Collection Time: 07/19/21 11:19 AM  ?Result Value Ref Range  ? D-Dimer, Quant 0.33 0.00 - 0.50 ug/mL-FEU  ?  Comment: (NOTE) ?At the manufacturer cut-off value of 0.5 ?g/mL FEU, this assay has a ?negative predictive value of 95-100%.This assay is intended for use ?in conjunction with a clinical pretest probability (PTP) assessment ?model to exclude pulmonary embolism (PE) and deep venous thrombosis ?(DVT) in outpatients suspected of PE or DVT. ?Results should be correlated with clinical presentation. ?Performed at [facility], [place], ?[place] [address] ?  ?Troponin I (High Sensitivity)     Status: None  ? Collection Time: 07/19/21 11:27 AM  ?Result Value Ref Range  ? Troponin I (High Sensitivity) <2 <18 ng/L  ?  Comment: (NOTE) ?Elevated high sensitivity troponin I (hsTnI) values and significant  ?changes across serial measurements may suggest ACS but many other  ?chronic and acute conditions are known to elevate hsTnI results.  ?Refer to the "Links" section for chest pain algorithms and additional  ?guidance. ?Performed at [facility], [place], ?[place] [address] ?  ?Resp Panel by RT-PCR (Flu A&B, Covid) Nasopharyngeal Swab     Status: None  ? Collection Time: 07/19/21 11:27 AM  ? Specimen: Nasopharyngeal Swab;  Nasopharyngeal(NP) swabs in vial transport medium  ?Result Value Ref Range  ? SARS Coronavirus 2 by RT PCR NEGATIVE NEGATIVE  ?  Comment: (NOTE) ?SARS-CoV-2 target nucleic acids are NOT DETECTED. ? ?The SARS-Co

## 2021-09-13 ENCOUNTER — Ambulatory Visit (INDEPENDENT_AMBULATORY_CARE_PROVIDER_SITE_OTHER): Payer: BC Managed Care – PPO | Admitting: Internal Medicine

## 2021-09-13 ENCOUNTER — Encounter: Payer: Self-pay | Admitting: Internal Medicine

## 2021-09-13 VITALS — BP 116/68 | HR 100 | Temp 97.9°F | Resp 16 | Ht 60.0 in | Wt 135.8 lb

## 2021-09-13 DIAGNOSIS — Z0000 Encounter for general adult medical examination without abnormal findings: Secondary | ICD-10-CM | POA: Diagnosis not present

## 2021-09-13 NOTE — Progress Notes (Signed)
Name: Hayley Hunt   MRN: [id number]    DOB: 09-Sep-1990   Date:09/13/2021       Progress Note  Subjective  Chief Complaint  Chief Complaint  Patient presents with   Annual Exam    HPI  Patient presents for annual CPE.  Diet: eats well balanced diet, mainly lean proteins  Exercise: weight training and cardio 5 times a week  [place] Office Visit from 08/23/2021 in [facility]  AUDIT-C Score 1      Depression: Phq 9 is  negative    09/13/2021    9:06 AM 08/23/2021    2:29 PM 07/20/2021   10:23 AM 01/25/2021   10:51 AM 01/16/2021    8:36 AM  Depression screen PHQ 2/9  Decreased Interest 0 0 0 0 0  Down, Depressed, Hopeless 0 0 0 0 0  PHQ - 2 Score 0 0 0 0 0  Altered sleeping 0 0 0    Tired, decreased energy 0 1 3    Change in appetite 0 1 0    Feeling bad or failure about yourself  0 1 0    Trouble concentrating 0 1 1    Moving slowly or fidgety/restless 0 1 0    Suicidal thoughts 0 0 0    PHQ-9 Score 0 5 4    Difficult doing work/chores Not difficult at all Not difficult at all      Hypertension: BP Readings from Last 3 Encounters:  09/13/21 116/68  08/23/21 110/64  07/20/21 112/62   Obesity: Wt Readings from Last 3 Encounters:  09/13/21 135 lb 12.8 oz (61.6 kg)  08/23/21 135 lb 14.4 oz (61.6 kg)  07/20/21 137 lb (62.1 kg)   BMI Readings from Last 3 Encounters:  09/13/21 26.52 kg/m  08/23/21 26.54 kg/m  07/20/21 26.76 kg/m     Vaccines:   HPV: n/a Tdap: 2020 Shingrix: n/a Pneumonia: n/a Flu: 2021, usually get them every year COVID-19: first 2 vaccines    Hep C Screening: 09/2017 STD testing and prevention (HIV/chl/gon/syphilis): no concerns Intimate partner violence: negative screen  Sexual History : married  Menstrual History/LMP/Abnormal Bleeding: LMP 08/18/21 Discussed importance of follow up if any post-menopausal bleeding: not applicable  Incontinence Symptoms: negative for symptoms   Breast cancer:  - Last  Mammogram: 02/03/21 negative (felt a lump at the time, [place] and mammogram negative, recommend starting regular screening at age 32).  Osteoporosis Prevention : Discussed high calcium and vitamin D supplementation, weight bearing exercises Bone density :not applicable   Cervical cancer screening: Pap negative 05/24/20  Skin cancer: Discussed monitoring for atypical lesions; follows with Dermatologist every 6 months  Colorectal cancer: N/a  - did have flex sigmoidoscopy in 03/24/2018 after dealing with IBS and blood in the stool - found to have hemorrhoids. No family history of colon cancer  Lung cancer:  Low Dose CT Chest recommended if Age 50-80 years, 44 pack-year currently smoking OR have quit w/in 15years. Patient does not qualify for screen   ECG: 07/19/21  Advanced Care Planning: A voluntary discussion about advance care planning including the explanation and discussion of advance directives.  Discussed health care proxy and Living will, and the patient was unable to identify a health care proxy as.  Patient does not have a living will and power of attorney of health care  Lipids: Lab Results  Component Value Date   CHOL 169 04/18/2020   CHOL 178 05/01/2018   CHOL 193 04/30/2017  Lab Results  Component Value Date   HDL 50 04/18/2020   HDL 47 05/01/2018   HDL 42 04/30/2017   Lab Results  Component Value Date   LDLCALC 105 (H) 04/18/2020   LDLCALC 118 (H) 05/01/2018   LDLCALC 126 (H) 04/30/2017   Lab Results  Component Value Date   TRIG 74 04/18/2020   TRIG 64 05/01/2018   TRIG 126 04/30/2017   Lab Results  Component Value Date   CHOLHDL 3.4 04/18/2020   CHOLHDL 3.8 05/01/2018   CHOLHDL 4.6 (H) 04/30/2017   No results found for: LDLDIRECT  Glucose: Glucose  Date Value Ref Range Status  04/18/2020 92 65 - 99 mg/dL Final  08/27/2011 91 65 - 99 mg/dL Final   Glucose, Bld  Date Value Ref Range Status  07/19/2021 109 (H) 70 - 99 mg/dL Final    Comment:    Glucose  reference range applies only to samples taken after fasting for at least 8 hours.  01/10/2019 96 70 - 99 mg/dL Final  06/16/2018 94 70 - 99 mg/dL Final    Patient Active Problem List   Diagnosis Date Noted   Mass of right axilla 01/26/2021   Hyperlipidemia 04/18/2020   Anxiety about health 04/18/2020   Vitamin D deficiency 05/01/2018   Gallbladder sludge 05/07/2017   Laryngopharyngeal reflux (LPR) 12/25/2015   Asthma, mild 09/13/2015   Allergic rhinitis 11/22/2014   Dermatitis, eczematoid 11/22/2014    Past Surgical History:  Procedure Laterality Date   FLEXIBLE SIGMOIDOSCOPY  03/24/2018   Procedure: FLEXIBLE SIGMOIDOSCOPY;  Surgeon: Lin Landsman, MD;  Location: [facility] ENDOSCOPY;  Service: Gastroenterology;;   WISDOM TOOTH EXTRACTION      Family History  Problem Relation Age of Onset   Asthma Mother    Hypothyroidism Mother    Lupus Mother    Osteoporosis Maternal Grandmother    Heart attack Maternal Grandfather    Heart disease Maternal Grandfather    Cancer Paternal Grandfather        lung   Breast cancer Neg Hx    Ovarian cancer Neg Hx     Social History   Socioeconomic History   Marital status: Married    Spouse name: Not on file   Number of children: 1   Years of education: 12   Highest education level: Associate degree: academic program  Occupational History   Not on file  Tobacco Use   Smoking status: Never   Smokeless tobacco: Never  Vaping Use   Vaping Use: Never used  Substance and Sexual Activity   Alcohol use: No    Alcohol/week: 0.0 standard drinks   Drug use: No   Sexual activity: Yes    Partners: Male    Birth control/protection: None    Comment: none  Other Topics Concern   Not on file  Social History Narrative   Not on file   Social Determinants of Health   Financial Resource Strain: Low Risk    Difficulty of Paying Living Expenses: Not hard at all  Food Insecurity: No Food Insecurity   Worried About Charity fundraiser in  the Last Year: Never true   Arboriculturist in the Last Year: Never true  Transportation Needs: No Transportation Needs   Lack of Transportation (Medical): No   Lack of Transportation (Non-Medical): No  Physical Activity: Sufficiently Active   Days of Exercise per Week: 4 days   Minutes of Exercise per Session: 60 min  Stress: Stress Concern Present  Feeling of Stress : Very much  Social Connections: Moderately Isolated   Frequency of Communication with Friends and Family: More than three times a week   Frequency of Social Gatherings with Friends and Family: Once a week   Attends Religious Services: Never   Marine scientist or Organizations: No   Attends Music therapist: Never   Marital Status: Married  Human resources officer Violence: Not At Risk   Fear of Current or Ex-Partner: No   Emotionally Abused: No   Physically Abused: No   Sexually Abused: No     Current Outpatient Medications:    albuterol (VENTOLIN HFA) 108 (90 Base) MCG/ACT inhaler, Inhale 1-2 puffs into the lungs every 6 (six) hours as needed for wheezing or shortness of breath. , Disp: , Rfl:    hydrOXYzine (ATARAX) 10 MG tablet, Take 1-2 tablets (10-20 mg total) by mouth 3 (three) times daily as needed for anxiety., Disp: 90 tablet, Rfl: 0  Allergies  Allergen Reactions   Amoxicillin Hives and Rash   Clindamycin/Lincomycin Rash   Metronidazole Itching, Other (See Comments) and Rash    Mouth swelling   Penicillins Anaphylaxis and Hives   Lactose Intolerance (Gi)    Latex      Review of Systems  All other systems reviewed and are negative.   Objective  Vitals:   09/13/21 0855  BP: 116/68  Resp: 16  Temp: 97.9 F (36.6 C)  Weight: 135 lb 12.8 oz (61.6 kg)  Height: 5' (1.524 m)    Body mass index is 26.52 kg/m.  Physical Exam Constitutional:      Appearance: Normal appearance.  HENT:     Head: Normocephalic and atraumatic.     Mouth/Throat:     Mouth: Mucous membranes are  moist.     Pharynx: Oropharynx is clear.  Eyes:     Conjunctiva/sclera: Conjunctivae normal.  Cardiovascular:     Rate and Rhythm: Normal rate and regular rhythm.  Pulmonary:     Effort: Pulmonary effort is normal.     Breath sounds: Normal breath sounds.  Abdominal:     General: There is no distension.     Palpations: Abdomen is soft.     Tenderness: There is no abdominal tenderness. There is no guarding.  Musculoskeletal:     Right lower leg: No edema.     Left lower leg: No edema.  Skin:    General: Skin is warm and dry.  Neurological:     General: No focal deficit present.     Mental Status: She is alert. Mental status is at baseline.  Psychiatric:        Mood and Affect: Mood normal.        Behavior: Behavior normal.    Recent Results (from the past 2160 hour(s))  Basic metabolic panel     Status: Abnormal   Collection Time: 07/19/21  9:14 AM  Result Value Ref Range   Sodium 138 135 - 145 mmol/L   Potassium 3.6 3.5 - 5.1 mmol/L   Chloride 107 98 - 111 mmol/L   CO2 23 22 - 32 mmol/L   Glucose, Bld 109 (H) 70 - 99 mg/dL    Comment: Glucose reference range applies only to samples taken after fasting for at least 8 hours.   BUN 10 6 - 20 mg/dL   Creatinine, Ser 0.61 0.44 - 1.00 mg/dL   Calcium 8.9 8.9 - 10.3 mg/dL   GFR, Estimated >60 >60 mL/min    Comment: (  NOTE) Calculated using the CKD-EPI Creatinine Equation (2021)    Anion gap 8 5 - 15    Comment: Performed at [facility], [address] [place], [place] [address]  CBC     Status: None   Collection Time: 07/19/21  9:14 AM  Result Value Ref Range   WBC 6.5 4.0 - 10.5 K/uL   RBC 4.74 3.87 - 5.11 MIL/uL   Hemoglobin 13.8 12.0 - 15.0 g/dL   HCT 39.5 36.0 - 46.0 %   MCV 83.3 80.0 - 100.0 fL   MCH 29.1 26.0 - 34.0 pg   MCHC 34.9 30.0 - 36.0 g/dL   RDW 12.6 11.5 - 15.5 %   Platelets 273 150 - 400 K/uL   nRBC 0.0 0.0 - 0.2 %    Comment: Performed at [facility], [place], [place] [address]  Troponin I (High Sensitivity)     Status: None   Collection Time: 07/19/21  9:14 AM  Result Value Ref Range   Troponin I (High Sensitivity) <2 <18 ng/L    Comment: (NOTE) Elevated high sensitivity troponin I (hsTnI) values and significant  changes across serial measurements may suggest ACS but many other  chronic and acute conditions are known to elevate hsTnI results.  Refer to the "Links" section for chest pain algorithms and additional  guidance. Performed at [facility], [address] [place], [place] [address]   hCG, quantitative, pregnancy     Status: None   Collection Time: 07/19/21  9:14 AM  Result Value Ref Range   hCG, Beta Chain, Quant, S <1 <5 mIU/mL    Comment:          GEST. AGE      CONC.  (mIU/mL)   <=1 WEEK        5 - 50     2 WEEKS       50 - 500     3 WEEKS       100 - 10,000     4 WEEKS     1,000 - 30,000     5 WEEKS     3,500 - 115,000   6-8 WEEKS     12,000 - 270,000    12 WEEKS     15,000 - 220,000        FEMALE AND NON-PREGNANT FEMALE:     LESS THAN 5 mIU/mL Performed at [facility], [address] [place],  [address]   D-dimer, quantitative     Status: None   Collection Time: 07/19/21 11:19 AM  Result Value Ref Range   D-Dimer, Quant 0.33 0.00 - 0.50 ug/mL-FEU    Comment: (NOTE) At the manufacturer cut-off value of 0.5 g/mL FEU, this assay has a negative predictive value of 95-100%.This assay is intended for use in conjunction with a clinical pretest probability (PTP) assessment model to exclude pulmonary embolism (PE) and deep venous thrombosis (DVT) in outpatients suspected of PE or DVT. Results should be correlated with clinical presentation. Performed at [facility], [place],  [address]   Troponin I (High Sensitivity)     Status: None   Collection Time: 07/19/21 11:27 AM  Result Value Ref Range   Troponin I (High Sensitivity) <2 <18 ng/L    Comment:  (NOTE) Elevated high sensitivity troponin I (hsTnI) values and significant  changes across serial measurements may suggest ACS but many other  chronic and acute conditions are known to elevate  hsTnI results.  Refer to the "Links" section for chest pain algorithms and additional  guidance. Performed at [facility], [address] [place], [place] [address]   Resp Panel by RT-PCR (Flu A&B, Covid) Nasopharyngeal Swab     Status: None   Collection Time: 07/19/21 11:27 AM   Specimen: Nasopharyngeal Swab; Nasopharyngeal(NP) swabs in vial transport medium  Result Value Ref Range   SARS Coronavirus 2 by RT PCR NEGATIVE NEGATIVE    Comment: (NOTE) SARS-CoV-2 target nucleic acids are NOT DETECTED.  The SARS-CoV-2 RNA is generally detectable in upper respiratory specimens during the acute phase of infection. The lowest concentration of SARS-CoV-2 viral copies this assay can detect is 138 copies/mL. A negative result does not preclude SARS-Cov-2 infection and should not be used as the sole basis for treatment or other patient management decisions. A negative result may occur with  improper specimen collection/handling, submission of specimen other than nasopharyngeal swab, presence of viral mutation(s) within the areas targeted by this assay, and inadequate number of viral copies(<138 copies/mL). A negative result must be combined with clinical observations, patient history, and epidemiological information. The expected result is Negative.  Fact Sheet for Patients:  EntrepreneurPulse.com.au  Fact Sheet for Healthcare Providers:  IncredibleEmployment.be  This test is no t yet approved or cleared by the [place] FDA and  has been authorized for detection and/or diagnosis of SARS-CoV-2 by FDA under an Emergency Use Authorization (EUA). This EUA will remain  in effect (meaning this test can be used) for the duration of the COVID-19  declaration under Section 564(b)(1) of the Act, 21 U.S.C.section 360bbb-3(b)(1), unless the authorization is terminated  or revoked sooner.       Influenza A by PCR NEGATIVE NEGATIVE   Influenza B by PCR NEGATIVE NEGATIVE    Comment: (NOTE) The Xpert Xpress SARS-CoV-2/FLU/RSV plus assay is intended as an aid in the diagnosis of influenza from Nasopharyngeal swab specimens and should not be used as a sole basis for treatment. Nasal washings and aspirates are unacceptable for Xpert Xpress SARS-CoV-2/FLU/RSV testing.  Fact Sheet for Patients: EntrepreneurPulse.com.au  Fact Sheet for Healthcare Providers: IncredibleEmployment.be  This test is not yet approved or cleared by the [place] FDA and has been authorized for detection and/or diagnosis of SARS-CoV-2 by FDA under an Emergency Use Authorization (EUA). This EUA will remain in effect (meaning this test can be used) for the duration of the COVID-19 declaration under Section 564(b)(1) of the Act, 21 U.S.C. section 360bbb-3(b)(1), unless the authorization is terminated or revoked.  Performed at [facility], [address] [place], [place] [address]      Fall Risk:    09/13/2021    9:06 AM 08/23/2021    2:28 PM 07/20/2021   10:23 AM 01/26/2021    3:19 PM 01/25/2021   10:51 AM  Fall Risk   Falls in the past year? 0 0 0 0 0  Number falls in past yr: 0  0  0  Injury with Fall? 0  0  0  Risk for fall due to :  No Fall Risks No Fall Risks    Follow up  Falls prevention discussed Falls prevention discussed  Falls evaluation completed     Functional Status Survey: Is the patient deaf or have difficulty hearing?: No Does the patient have difficulty seeing, even when wearing glasses/contacts?: No Does the patient have difficulty concentrating, remembering, or making decisions?: No Does the patient have difficulty walking or climbing stairs?: No Does  the patient have difficulty  dressing or bathing?: No Does the patient have difficulty doing errands alone such as visiting a doctor's office or shopping?: No   Assessment & Plan  1. Routine general medical examination at a health care facility: Up to date with labs, reviewed from 07/19/21. Also had labs done through work. Up to date with Pap, plan to repeat next year.    -USPSTF grade A and B recommendations reviewed with patient; age-appropriate recommendations, preventive care, screening tests, etc discussed and encouraged; healthy living encouraged; see AVS for patient education given to patient -Discussed importance of 150 minutes of physical activity weekly, eat two servings of fish weekly, eat one serving of tree nuts ( cashews, pistachios, pecans, almonds.Marland Kitchen) every other day, eat 6 servings of fruit/vegetables daily and drink plenty of water and avoid sweet beverages.   -Reviewed Health Maintenance: Yes.

## 2021-09-13 NOTE — Patient Instructions (Signed)
It was great seeing you today!  Plan discussed at today's visit: -Up to date on blood work, plan for Pap next year  Follow up in: 1 year  Take care and let us know if you have any questions or concerns prior to your next visit.  Dr. Rosana Berger  Low-FODMAP Eating Plan  FODMAP stands for fermentable oligosaccharides, disaccharides, monosaccharides, and polyols. These are sugars that are hard for some people to digest. A low-FODMAP eating plan may help some people who have irritable bowel syndrome (IBS) and certain other bowel (intestinal) diseases to manage their symptoms. This meal plan can be complicated to follow. Work with a diet and nutrition specialist (dietitian) to make a low-FODMAP eating plan that is right for you. A dietitian can help make sure that you get enough nutrition from this diet. What are tips for following this plan? Reading food labels Check labels for hidden FODMAPs such as: High-fructose syrup. Honey. Agave. Natural fruit flavors. Onion or garlic powder. Choose low-FODMAP foods that contain 3-4 grams of fiber per serving. Check food labels for serving sizes. Eat only one serving at a time to make sure FODMAP levels stay low. Shopping Shop with a list of foods that are recommended on this diet and make a meal plan. Meal planning Follow a low-FODMAP eating plan for up to 6 weeks, or as told by your health care provider or dietitian. To follow the eating plan: Eliminate high-FODMAP foods from your diet completely. Choose only low-FODMAP foods to eat. You will do this for 2-6 weeks. Gradually reintroduce high-FODMAP foods into your diet one at a time. Most people should wait a few days before introducing the next new high-FODMAP food into their meal plan. Your dietitian can recommend how quickly you may reintroduce foods. Keep a daily record of what and how much you eat and drink. Make note of any symptoms that you have after eating. Review your daily record with a  dietitian regularly to identify which foods you can eat and which foods you should avoid. General tips Drink enough fluid each day to keep your urine pale yellow. Avoid processed foods. These often have added sugar and may be high in FODMAPs. Avoid most dairy products, whole grains, and sweeteners. Work with a dietitian to make sure you get enough fiber in your diet. Avoid high FODMAP foods at meals to manage symptoms. Recommended foods Fruits Bananas, oranges, tangerines, lemons, limes, blueberries, raspberries, strawberries, grapes, cantaloupe, honeydew melon, kiwi, papaya, passion fruit, and pineapple. Limited amounts of dried cranberries, banana chips, and shredded coconut. Vegetables Eggplant, zucchini, cucumber, peppers, green beans, bean sprouts, lettuce, arugula, kale, Swiss chard, spinach, collard greens, bok choy, summer squash, potato, and tomato. Limited amounts of corn, carrot, and sweet potato. Green parts of scallions. Grains Gluten-free grains, such as rice, oats, buckwheat, quinoa, corn, polenta, and millet. Gluten-free pasta, bread, or cereal. Rice noodles. Corn tortillas. Meats and other proteins Unseasoned beef, pork, poultry, or fish. Eggs. Berniece Salines. Tofu (firm) and tempeh. Limited amounts of nuts and seeds, such as almonds, walnuts, [place] nuts, pecans, peanuts, nut butters, pumpkin seeds, chia seeds, and sunflower seeds. Dairy Lactose-free milk, yogurt, and kefir. Lactose-free cottage cheese and ice cream. Non-dairy milks, such as almond, coconut, hemp, and rice milk. Non-dairy yogurt. Limited amounts of goat cheese, brie, mozzarella, parmesan, swiss, and other hard cheeses. Fats and oils Butter-free spreads. Vegetable oils, such as olive, canola, and sunflower oil. Seasoning and other foods Artificial sweeteners with names that do not end in "ol,"  such as aspartame, saccharine, and stevia. Maple syrup, white table sugar, raw sugar, brown sugar, and molasses. Mayonnaise, soy  sauce, and tamari. Fresh basil, coriander, parsley, rosemary, and thyme. Beverages Water and mineral water. Sugar-sweetened soft drinks. Small amounts of orange juice or cranberry juice. Black and green tea. Most dry wines. Coffee. The items listed above may not be a complete list of foods and beverages you can eat. Contact a dietitian for more information. Foods to avoid Fruits Fresh, dried, and juiced forms of apple, pear, watermelon, peach, plum, cherries, apricots, blackberries, boysenberries, figs, nectarines, and mango. Avocado. Vegetables Chicory root, artichoke, asparagus, cabbage, snow peas, Brussels sprouts, broccoli, sugar snap peas, mushrooms, celery, and cauliflower. Onions, garlic, leeks, and the white part of scallions. Grains Wheat, including kamut, durum, and semolina. Barley and bulgur. Couscous. Wheat-based cereals. Wheat noodles, bread, crackers, and pastries. Meats and other proteins Fried or fatty meat. Sausage. Cashews and pistachios. Soybeans, baked beans, black beans, chickpeas, kidney beans, fava beans, navy beans, lentils, black-eyed peas, and split peas. Dairy Milk, yogurt, ice cream, and soft cheese. Cream and sour cream. Milk-based sauces. Custard. Buttermilk. Soy milk. Seasoning and other foods Any sugar-free gum or candy. Foods that contain artificial sweeteners such as sorbitol, mannitol, isomalt, or xylitol. Foods that contain honey, high-fructose corn syrup, or agave. Bouillon, vegetable stock, beef stock, and chicken stock. Garlic and onion powder. Condiments made with onion, such as hummus, chutney, pickles, relish, salad dressing, and salsa. Tomato paste. Beverages Chicory-based drinks. Coffee substitutes. Chamomile tea. Fennel tea. Sweet or fortified wines such as port or sherry. Diet soft drinks made with isomalt, mannitol, maltitol, sorbitol, or xylitol. Apple, pear, and mango juice. Juices with high-fructose corn syrup. The items listed above may not be a  complete list of foods and beverages you should avoid. Contact a dietitian for more information. Summary FODMAP stands for fermentable oligosaccharides, disaccharides, monosaccharides, and polyols. These are sugars that are hard for some people to digest. A low-FODMAP eating plan is a short-term diet that helps to ease symptoms of certain bowel diseases. The eating plan usually lasts up to 6 weeks. After that, high-FODMAP foods are reintroduced gradually and one at a time. This can help you find out which foods may be causing symptoms. A low-FODMAP eating plan can be complicated. It is best to work with a dietitian who has experience with this type of plan. This information is not intended to replace advice given to you by your health care provider. Make sure you discuss any questions you have with your health care provider. Document Revised: 08/27/2019 Document Reviewed: 08/27/2019 Elsevier Patient Education  [place].

## 2021-10-05 DIAGNOSIS — D225 Melanocytic nevi of trunk: Secondary | ICD-10-CM | POA: Diagnosis not present

## 2021-10-05 DIAGNOSIS — D485 Neoplasm of uncertain behavior of skin: Secondary | ICD-10-CM | POA: Diagnosis not present

## 2021-10-05 DIAGNOSIS — Z1283 Encounter for screening for malignant neoplasm of skin: Secondary | ICD-10-CM | POA: Diagnosis not present

## 2021-11-24 ENCOUNTER — Ambulatory Visit: Payer: BC Managed Care – PPO | Admitting: Family Medicine

## 2022-06-21 ENCOUNTER — Other Ambulatory Visit: Payer: Self-pay | Admitting: Family Medicine

## 2022-06-21 DIAGNOSIS — F411 Generalized anxiety disorder: Secondary | ICD-10-CM

## 2022-06-21 MED ORDER — HYDROXYZINE HCL 10 MG PO TABS: 10.0000 mg | ORAL_TABLET | Freq: Three times a day (TID) | ORAL | 0 refills | Status: DC | PRN

## 2022-06-21 NOTE — Telephone Encounter (Signed)
Requested Prescriptions  Pending Prescriptions Disp Refills   hydrOXYzine (ATARAX) 10 MG tablet 180 tablet 0    Sig: Take 1-2 tablets (10-20 mg total) by mouth 3 (three) times daily as needed for anxiety.     Ear, Nose, and Throat:  Antihistamines 2 Passed - 06/21/2022 11:23 AM      Passed - Cr in normal range and within 360 days    Creatinine  Date Value Ref Range Status  08/27/2011 0.62 0.60 - 1.30 mg/dL Final   Creatinine, Ser  Date Value Ref Range Status  07/19/2021 0.61 0.44 - 1.00 mg/dL Final         Passed - Valid encounter within last 12 months    Recent Outpatient Visits           9 months ago Routine general medical examination at a health care facility   [place], DO   10 months ago GAD (generalized anxiety disorder)   [facility] Steele Sizer, MD   11 months ago Anterior chest wall pain   [facility] Steele Sizer, MD   1 year ago Lymphadenitis, acute   [facility] Bo Merino, FNP   1 year ago Lymphadenitis, acute   [facility] Bo Merino, FNP       Future Appointments             In 3 months Steele Sizer, MD [facility], [facility]

## 2022-06-21 NOTE — Telephone Encounter (Signed)
Medication Refill - Medication: hydrOXYzine (ATARAX) 10 MG tablet   Has the patient contacted their pharmacy? Yes.   (Agent: If no, request that the patient contact the pharmacy for the refill. If patient does not wish to contact the pharmacy document the reason why and proceed with request.) (Agent: If yes, when and what did the pharmacy advise?)  Preferred Pharmacy (with phone number or street name):  [facility], [address] AT [address] Phone: [phone]  Fax: [phone]     Has the patient been seen for an appointment in the last year OR does the patient have an upcoming appointment? Yes.    Agent: Please be advised that RX refills may take up to 3 business days. We ask that you follow-up with your pharmacy.

## 2022-08-14 ENCOUNTER — Ambulatory Visit (INDEPENDENT_AMBULATORY_CARE_PROVIDER_SITE_OTHER): Payer: Self-pay | Admitting: Family Medicine

## 2022-08-14 ENCOUNTER — Encounter: Payer: Self-pay | Admitting: Family Medicine

## 2022-08-14 VITALS — BP 112/70 | HR 100 | Temp 98.4°F | Resp 16 | Ht 60.0 in | Wt 138.6 lb

## 2022-08-14 DIAGNOSIS — Z8669 Personal history of other diseases of the nervous system and sense organs: Secondary | ICD-10-CM

## 2022-08-14 DIAGNOSIS — R519 Headache, unspecified: Secondary | ICD-10-CM

## 2022-08-14 DIAGNOSIS — F419 Anxiety disorder, unspecified: Secondary | ICD-10-CM

## 2022-08-14 DIAGNOSIS — J309 Allergic rhinitis, unspecified: Secondary | ICD-10-CM

## 2022-08-14 MED ORDER — ESCITALOPRAM OXALATE 5 MG PO TABS: 5.0000 mg | ORAL_TABLET | Freq: Every day | ORAL | 0 refills | Status: DC

## 2022-08-14 MED ORDER — FEXOFENADINE HCL 180 MG PO TABS: 180.0000 mg | ORAL_TABLET | Freq: Every day | ORAL | 1 refills | Status: DC

## 2022-08-14 NOTE — Progress Notes (Signed)
Patient ID: Hayley Hunt, female    DOB: 1991-03-04, 32 y.o.   MRN: [id number]  PCP: Alba Cory, MD  Chief Complaint  Patient presents with   Headache    Onset for months, on/off throughout the day pt states unsure if its related to her anxiety.    Subjective:   Hayley Hunt is a 32 y.o. female, presents to clinic with CC of the following:  Headache  This is a recurrent problem.    Hx of aura migraines, but those stopped (age [age]?) Has had some new HA's with ear fullness, pressure, dizziness - went to ENT who suspected vestibular migraines Aura migraines returned after that Vision changed and arm went numb with one migraine years ago went to ER - MRI was normal Prior consult with neuro offered meds - but she didn't take them Over the past couple years overall migraines have been very rare up until a few months ago she started to have HA's which are unlike other migraines in the past OBGYN and neurology previously had recommended OCP to help manage when they seemed hormonal, Bernita Raisin was Rx in the past but not tried More recently the are intermittent, episodes of Headaches for months  Usually to right frontal/forehead, around right eye/nose and to cheek and sometimes sinus pressure comes and goes with it No pattern to when it comes or is sharp or just pressure or more or less severe When it comes usually last a few hours - not continuous Since sx started she got her eyes checked and vision is good When HA's occur no eye sx or runny nose, vision changes, photophobia, phonophobia, dizziness, nausea vomiting She reports moving into a different home in October and her headaches changing over the last several months she cannot state exactly when they started Everyone else in the house is having worse allergy symptoms but her allergies have been better than baseline so she is not taking her allergy medicines or no sprays daily only intermittently  She has experienced severe  increase in anxiety and panic attacks that been worsening over several years she is not sure if this is related to her headaches She is terrified there something wrong but she is also terrified of having any imaging or radiation exposure She is tried talking with therapist, BuSpar, Xanax or Ativan, hydroxyzine and all of them have caused weird side effects or severe fatigue and sleepiness She wakes up every day with racing heart and feeling panicked for no reason and she would like to attempt other medicines that may help control her anxiety symptoms better   Change in anxiety/stress for the past couple years Poor sleep with daughter -the stress with her daughter began when she was born and has not gotten better over the last 3 years she is currently 3 and half sleeping patterns are regular, she wakes up sometimes 3-4 times at night, her daughter screams at the time and her daughter is very anxious which causes her to be anxious  Moved into different home October and HA's occurring for months Allergies/sinus - will use claritin/flonase prn She is a history of asthma but no recent worsening breathing symptoms or asthma symptoms No rashes, lymphadenopathy  There is a positive family history of migraines Patient has no history of head trauma, obesity, hypertension, immunosuppression No recent change in medications, worse sinus symptoms, change in menstrual cycle, caffeine withdrawal Patient has not been able to identify any other aggravating or alleviating factors  Patient Active Problem List   Diagnosis Date Noted   Mass of right axilla 01/26/2021   Hyperlipidemia 04/18/2020   Anxiety about health 04/18/2020   Vitamin D deficiency 05/01/2018   Gallbladder sludge 05/07/2017   Laryngopharyngeal reflux (LPR) 12/25/2015   Asthma, mild 09/13/2015   Allergic rhinitis 11/22/2014   Dermatitis, eczematoid 11/22/2014      Current Outpatient Medications:    albuterol (VENTOLIN HFA) 108 (90  Base) MCG/ACT inhaler, Inhale 1-2 puffs into the lungs every 6 (six) hours as needed for wheezing or shortness of breath. , Disp: , Rfl:    hydrOXYzine (ATARAX) 10 MG tablet, Take 1-2 tablets (10-20 mg total) by mouth 3 (three) times daily as needed for anxiety., Disp: 180 tablet, Rfl: 0   ALPRAZolam (XANAX) 0.25 MG tablet, TAKE 1 TABLET BY MOUTH UP TO EVERY 8 HOURS AS NEEDED FOR ANXIETY (Patient not taking: Reported on 08/14/2022), Disp: , Rfl:    Allergies  Allergen Reactions   Amoxicillin Hives and Rash   Clindamycin/Lincomycin Rash   Metronidazole Itching, Other (See Comments) and Rash    Mouth swelling   Penicillins Anaphylaxis and Hives   Lactose Intolerance (Gi)    Latex      Social History   Tobacco Use   Smoking status: Never   Smokeless tobacco: Never  Vaping Use   Vaping Use: Never used  Substance Use Topics   Alcohol use: No    Alcohol/week: 0.0 standard drinks of alcohol   Drug use: No      Chart Review Today: I personally reviewed active problem list, medication list, allergies, family history, social history, health maintenance, notes from last encounter, lab results, imaging with the patient/caregiver today.   Review of Systems  Constitutional: Negative.   HENT: Negative.    Eyes: Negative.   Respiratory: Negative.    Cardiovascular: Negative.   Gastrointestinal: Negative.   Endocrine: Negative.   Genitourinary: Negative.   Musculoskeletal: Negative.   Skin: Negative.   Allergic/Immunologic: Negative.   Neurological:  Positive for headaches.  Hematological: Negative.   Psychiatric/Behavioral:  Positive for dysphoric mood and sleep disturbance. The patient is nervous/anxious.   All other systems reviewed and are negative.      Objective:   Vitals:   08/14/22 1100  BP: 112/70  Pulse: 100  Resp: 16  Temp: 98.4 F (36.9 C)  TempSrc: Oral  SpO2: 100%  Weight: 138 lb 9.6 oz (62.9 kg)  Height: 5' (1.524 m)    Body mass index is 27.07  kg/m.  Physical Exam Vitals and nursing note reviewed.  Constitutional:      General: She is not in acute distress.    Appearance: Normal appearance. She is well-developed, well-groomed and normal weight. She is not ill-appearing, toxic-appearing or diaphoretic.  HENT:     Head: Normocephalic and atraumatic.     Right Ear: Tympanic membrane, ear canal and external ear normal. There is no impacted cerumen.     Left Ear: Tympanic membrane, ear canal and external ear normal. There is no impacted cerumen.     Nose: Mucosal edema and rhinorrhea present. No nasal deformity or congestion. Rhinorrhea is clear.     Right Turbinates: Enlarged and pale.     Left Turbinates: Enlarged and pale.     Right Sinus: No maxillary sinus tenderness or frontal sinus tenderness.     Left Sinus: No maxillary sinus tenderness or frontal sinus tenderness.     Mouth/Throat:     Mouth: Mucous membranes are  moist.     Pharynx: Oropharynx is clear. No oropharyngeal exudate or posterior oropharyngeal erythema.  Eyes:     General: No scleral icterus.       Right eye: No discharge.        Left eye: No discharge.     Conjunctiva/sclera: Conjunctivae normal.     Pupils: Pupils are equal, round, and reactive to light.  Neck:     Trachea: No tracheal deviation.  Cardiovascular:     Rate and Rhythm: Normal rate and regular rhythm.     Pulses: Normal pulses.     Heart sounds: Normal heart sounds.  Pulmonary:     Effort: Pulmonary effort is normal. No respiratory distress.     Breath sounds: Normal breath sounds. No stridor. No wheezing, rhonchi or rales.  Musculoskeletal:        General: Normal range of motion.     Cervical back: Normal range of motion and neck supple.  Lymphadenopathy:     Cervical: No cervical adenopathy.  Skin:    General: Skin is warm and dry.     Findings: No rash.  Neurological:     Mental Status: She is alert.     Motor: No abnormal muscle tone.     Coordination: Coordination normal.   Psychiatric:        Attention and Perception: Attention normal.        Mood and Affect: Mood is anxious. Affect is tearful.        Speech: Speech normal.        Behavior: Behavior normal. Behavior is cooperative.        Cognition and Memory: Cognition and memory normal.      Results for orders placed or performed during the hospital encounter of 07/19/21  Resp Panel by RT-PCR (Flu A&B, Covid) Nasopharyngeal Swab   Specimen: Nasopharyngeal Swab; Nasopharyngeal(NP) swabs in vial transport medium  Result Value Ref Range   SARS Coronavirus 2 by RT PCR NEGATIVE NEGATIVE   Influenza A by PCR NEGATIVE NEGATIVE   Influenza B by PCR NEGATIVE NEGATIVE  Basic metabolic panel  Result Value Ref Range   Sodium 138 135 - 145 mmol/L   Potassium 3.6 3.5 - 5.1 mmol/L   Chloride 107 98 - 111 mmol/L   CO2 23 22 - 32 mmol/L   Glucose, Bld 109 (H) 70 - 99 mg/dL   BUN 10 6 - 20 mg/dL   Creatinine, Ser 5.62 0.44 - 1.00 mg/dL   Calcium 8.9 8.9 - 13.0 mg/dL   GFR, Estimated >86 >57 mL/min   Anion gap 8 5 - 15  CBC  Result Value Ref Range   WBC 6.5 4.0 - 10.5 K/uL   RBC 4.74 3.87 - 5.11 MIL/uL   Hemoglobin 13.8 12.0 - 15.0 g/dL   HCT 84.6 96.2 - 95.2 %   MCV 83.3 80.0 - 100.0 fL   MCH 29.1 26.0 - 34.0 pg   MCHC 34.9 30.0 - 36.0 g/dL   RDW 84.1 32.4 - 40.1 %   Platelets 273 150 - 400 K/uL   nRBC 0.0 0.0 - 0.2 %  hCG, quantitative, pregnancy  Result Value Ref Range   hCG, Beta Chain, Quant, S <1 <5 mIU/mL  D-dimer, quantitative  Result Value Ref Range   D-Dimer, Quant 0.33 0.00 - 0.50 ug/mL-FEU  Troponin I (High Sensitivity)  Result Value Ref Range   Troponin I (High Sensitivity) <2 <18 ng/L  Troponin I (High Sensitivity)  Result Value Ref Range  Troponin I (High Sensitivity) <2 <18 ng/L       Assessment & Plan:   1. Nonintractable episodic headache, unspecified headache type Intermittent HA to right frontal sinus around right eye nose and right max sinus -intermittent sometimes sharp  sometimes with pressure No red flags related to her headaches She does have a history of allergies, migraines, vestibular migraines not currently on medications for any of these She recently moved into a house that has a lot of moisture and she suspects mold she does not know if it is related to this She has enlarged boggy and pale nasal turbinates otherwise no sinus tenderness to palpation and she did recently have her vision checked Current episodes of headaches did not feel like prior migraines -they are less severe Recommended trying adjusting allergy medications for something that may cover more indoor allergies, investigating moisture mold in her home, and doing her intranasal steroid spray trying the allergy meds and nose sprays for at least 2 weeks to see if it decreases the amount of headaches also recommended keeping a headache journal  She does have multiple stressors and other factors that could be contributing towards her headaches including severe stress anxiety and panic episodes, poor sleep  - fexofenadine (ALLEGRA) 180 MG tablet; Take 1 tablet (180 mg total) by mouth daily.  Dispense: 30 tablet; Refill: 1  2. History of migraine with aura Would recommend the patient reestablish with neurology after she gets insurance coverage again which should happen in about a month She could try abortive headache/migraine medication such as Imitrex, Ubrelvy, Nurtec -but currently the better medications are limited by price and lack of insurance and she would likely have bothersome side effects with Imitrex which is very anxious to avoid meds or side effects at this time -we could possibly see if we can get some samples of Nurtec available to the patient  3. History of atypical migraine Same as #2  4. Allergic rhinitis, unspecified seasonality, unspecified trigger Recommended she do allergy medications daily and intranasal steroid spray daily as well to see if this helps - fexofenadine  (ALLEGRA) 180 MG tablet; Take 1 tablet (180 mg total) by mouth daily.  Dispense: 30 tablet; Refill: 1  5. Anxiety disorder, unspecified type Patient is extremely anxious in the room she was unable to sit down she was pacing visibly upset and emotional also repeatedly apologized while in room she is currently seeing a therapist who does what sounds most like talk therapy and she reports this is not helpful for her She has worked with her PCP in the past regarding her symptoms which have been very bothersome and worsening since her daughter was born 3-1/2 years ago She has tried and failed BuSpar this made her feel jittery and tachycardic made her feel more anxious, tried hydroxyzine but this makes her just excessively sleepy and also did not help her anxiety symptoms, agitation or panic, some time in the past tried Xanax or Ativan but those also just caused her to be sedated and sleepy  We discussed medication options with the severity and persistence of her symptoms starting SSRI may be very helpful and also establishing with a therapist who does more specific therapy techniques for her symptoms such as cognitive behavioral therapy etc. recommended however patient has tried to search the entire area for any providers taking new patients that do offer more specific therapy techniques and she cannot find anyone taking patients (Will discuss with patient's PCP and see if there is any  other resources available)  We discussed multiple medication options reviewing their effectiveness, how long it takes for the medications to start working, potential side effects both temporary and SE later with use or higher doses Will try lexapro -explained that she could try taking it at night for the first 2 weeks she may have some GI side effects which I would expect to resolve in the first week or 2.  She is not currently having trouble falling asleep so if she begins to have difficulty falling asleep when starting Lexapro  she was instructed to change it to taking in the morning She may see some benefit in the first 2 weeks but more than likely would take 4 to 6 weeks to see effectiveness and reach stable state with brain chemistry I reviewed again that medications may not work for her or may need to adjust doses or try other kinds of meds and they usually do work best in combination with the right kind of therapy and other coping skills  - escitalopram (LEXAPRO) 5 MG tablet; Take 1 tablet (5 mg total) by mouth daily.  Dispense: 90 tablet; Refill: 0   very close f/up with PCP or myself recommended in ~ 3 weeks  If HA's don't improve may want to do some imaging or get back with neurology    Danelle Berry, PA-C 08/14/22 11:14 AM

## 2022-08-14 NOTE — Patient Instructions (Addendum)
CBT - cognitive behavioral therapy  Other approach or therapy techniques for panic attacks  I would try allegra and do your nose spray for 2 weeks to see if that improves or reduces the amount of headaches

## 2022-09-04 ENCOUNTER — Ambulatory Visit: Payer: Self-pay | Admitting: Family Medicine

## 2022-09-07 ENCOUNTER — Ambulatory Visit: Payer: Self-pay | Admitting: Family Medicine

## 2022-09-19 ENCOUNTER — Encounter: Payer: BC Managed Care – PPO | Admitting: Family Medicine

## 2022-10-01 NOTE — Progress Notes (Unsigned)
Name: Hayley Hunt   MRN: [id number]    DOB: 12-09-1990   Date:10/02/2022       Progress Note  Subjective  Chief Complaint  Annual Exam  HPI  Patient presents for annual CPE.  Diet: balanced diet  Exercise: continue regular physical activity   Last Eye Exam: up to date  Last Dental Exam: up to date   Flowsheet Row Office Visit from 08/23/2021 in [facility]  AUDIT-C Score 1         10/02/2022    8:31 AM 08/14/2022   10:59 AM 08/23/2021    2:29 PM 07/20/2021   10:49 AM  GAD 7 : Generalized Anxiety Score  Nervous, Anxious, on Edge 1 3 3 3   Control/stop worrying 0 3 3 3   Worry too much - different things 1 3 3 3   Trouble relaxing 0 3 3 3   Restless 1 3 3 3   Easily annoyed or irritable 0 3 3 3   Afraid - awful might happen 1 3 3 3   Total GAD 7 Score 4 21 21 21   Anxiety Difficulty Not difficult at all Extremely difficult Somewhat difficult Extremely difficult     Depression: Phq 9 is  negative    10/02/2022    8:13 AM 08/14/2022   10:53 AM 09/13/2021    9:06 AM 08/23/2021    2:29 PM 07/20/2021   10:23 AM  Depression screen PHQ 2/9  Decreased Interest 1 3 0 0 0  Down, Depressed, Hopeless 0 3 0 0 0  PHQ - 2 Score 1 6 0 0 0  Altered sleeping 0 0 0 0 0  Tired, decreased energy 0 3 0 1 3  Change in appetite 0 2 0 1 0  Feeling bad or failure about yourself  0 0 0 1 0  Trouble concentrating 0 3 0 1 1  Moving slowly or fidgety/restless 0 1 0 1 0  Suicidal thoughts 0 0 0 0 0  PHQ-9 Score 1 15 0 5 4  Difficult doing work/chores  Somewhat difficult Not difficult at all Not difficult at all    Hypertension: BP Readings from Last 3 Encounters:  10/02/22 108/68  08/14/22 112/70  09/13/21 116/68   Obesity: Wt Readings from Last 3 Encounters:  10/02/22 141 lb (64 kg)  08/14/22 138 lb 9.6 oz (62.9 kg)  09/13/21 135 lb 12.8 oz (61.6 kg)   BMI Readings from Last 3 Encounters:  10/02/22 27.54 kg/m  08/14/22 27.07 kg/m  09/13/21 26.52 kg/m      Vaccines:   HPV: N/A Tdap: up to date Shingrix: N/A Pneumonia: N/A Flu: 2021, due COVID-19: N/A   Hep C Screening: 04/30/17 STD testing and prevention (HIV/chl/gon/syphilis): 07/02/18 Intimate partner violence: negative screen  Sexual History : one partner - no problems  Menstrual History/LMP/Abnormal Bleeding: irregular cycles,  not on birth control , she has noticed some LLQ pain - she states she used to have ovarian cysts and feels similar  Discussed importance of follow up if any post-menopausal bleeding: not applicable  Incontinence Symptoms: negative for symptoms   Breast cancer:  - Last Mammogram: N/A - BRCA gene screening: N/A  Osteoporosis Prevention : Discussed high calcium and vitamin D supplementation, weight bearing exercises Bone density: N/A   Cervical cancer screening: 04/18/20, due in December. Ordered today  Skin cancer: Discussed monitoring for atypical lesions  Colorectal cancer: N/A   Lung cancer:  Low Dose CT Chest recommended if Age 21-80 years, 20 pack-year currently  smoking OR have quit w/in 15years. Patient does not qualify for screen   ECG: 07/19/21  Advanced Care Planning: A voluntary discussion about advance care planning including the explanation and discussion of advance directives.  Discussed health care proxy and Living will, and the patient was able to identify a health care proxy as husband.  Patient does not have a living will and power of attorney of health care   Lipids: Lab Results  Component Value Date   CHOL 169 04/18/2020   CHOL 178 05/01/2018   CHOL 193 04/30/2017   Lab Results  Component Value Date   HDL 50 04/18/2020   HDL 47 05/01/2018   HDL 42 04/30/2017   Lab Results  Component Value Date   LDLCALC 105 (H) 04/18/2020   LDLCALC 118 (H) 05/01/2018   LDLCALC 126 (H) 04/30/2017   Lab Results  Component Value Date   TRIG 74 04/18/2020   TRIG 64 05/01/2018   TRIG 126 04/30/2017   Lab Results  Component Value Date    CHOLHDL 3.4 04/18/2020   CHOLHDL 3.8 05/01/2018   CHOLHDL 4.6 (H) 04/30/2017   No results found for: "LDLDIRECT"  Glucose: Glucose  Date Value Ref Range Status  04/18/2020 92 65 - 99 mg/dL Final  19/14/7829 91 65 - 99 mg/dL Final   Glucose, Bld  Date Value Ref Range Status  07/19/2021 109 (H) 70 - 99 mg/dL Final    Comment:    Glucose reference range applies only to samples taken after fasting for at least 8 hours.  01/10/2019 96 70 - 99 mg/dL Final  56/21/3086 94 70 - 99 mg/dL Final    Patient Active Problem List   Diagnosis Date Noted   Mass of right axilla 01/26/2021   Hyperlipidemia 04/18/2020   Anxiety about health 04/18/2020   Vitamin D deficiency 05/01/2018   Gallbladder sludge 05/07/2017   Laryngopharyngeal reflux (LPR) 12/25/2015   Asthma, mild 09/13/2015   Allergic rhinitis 11/22/2014   Dermatitis, eczematoid 11/22/2014    Past Surgical History:  Procedure Laterality Date   FLEXIBLE SIGMOIDOSCOPY  03/24/2018   Procedure: FLEXIBLE SIGMOIDOSCOPY;  Surgeon: Toney Reil, MD;  Location: [facility] ENDOSCOPY;  Service: Gastroenterology;;   WISDOM TOOTH EXTRACTION      Family History  Problem Relation Age of Onset   Asthma Mother    Hypothyroidism Mother    Lupus Mother    Osteoporosis Maternal Grandmother    Heart attack Maternal Grandfather    Heart disease Maternal Grandfather    Cancer Paternal Grandfather        lung   Breast cancer Neg Hx    Ovarian cancer Neg Hx     Social History   Socioeconomic History   Marital status: Married    Spouse name: Not on file   Number of children: 1   Years of education: 12   Highest education level: Associate degree: academic program  Occupational History   Not on file  Tobacco Use   Smoking status: Never   Smokeless tobacco: Never  Vaping Use   Vaping Use: Never used  Substance and Sexual Activity   Alcohol use: No    Alcohol/week: 0.0 standard drinks of alcohol   Drug use: No   Sexual activity: Yes     Partners: Male    Birth control/protection: None    Comment: none  Other Topics Concern   Not on file  Social History Narrative   Not on file   Social Determinants of Health  Financial Resource Strain: Low Risk  (10/02/2022)   Overall Financial Resource Strain (CARDIA)    Difficulty of Paying Living Expenses: Not hard at all  Food Insecurity: No Food Insecurity (10/02/2022)   Hunger Vital Sign    Worried About Running Out of Food in the Last Year: Never true    Ran Out of Food in the Last Year: Never true  Transportation Needs: No Transportation Needs (10/02/2022)   PRAPARE - Administrator, Civil Service (Medical): No    Lack of Transportation (Non-Medical): No  Physical Activity: Sufficiently Active (10/02/2022)   Exercise Vital Sign    Days of Exercise per Week: 4 days    Minutes of Exercise per Session: 60 min  Stress: No Stress Concern Present (10/02/2022)   [employer] of Occupational Health - Occupational Stress Questionnaire    Feeling of Stress : Not at all  Social Connections: Moderately Isolated (10/02/2022)   Social Connection and Isolation Panel [NHANES]    Frequency of Communication with Friends and Family: More than three times a week    Frequency of Social Gatherings with Friends and Family: More than three times a week    Attends Religious Services: Never    Database administrator or Organizations: No    Attends Banker Meetings: Never    Marital Status: Married  Catering manager Violence: Not At Risk (10/02/2022)   Humiliation, Afraid, Rape, and Kick questionnaire    Fear of Current or Ex-Partner: No    Emotionally Abused: No    Physically Abused: No    Sexually Abused: No     Current Outpatient Medications:    albuterol (VENTOLIN HFA) 108 (90 Base) MCG/ACT inhaler, Inhale 1-2 puffs into the lungs every 6 (six) hours as needed for wheezing or shortness of breath. , Disp: , Rfl:    escitalopram (LEXAPRO) 5 MG tablet, Take 1  tablet (5 mg total) by mouth daily., Disp: 90 tablet, Rfl: 0   fexofenadine (ALLEGRA) 180 MG tablet, Take 1 tablet (180 mg total) by mouth daily., Disp: 30 tablet, Rfl: 1   ALPRAZolam (XANAX) 0.25 MG tablet, TAKE 1 TABLET BY MOUTH UP TO EVERY 8 HOURS AS NEEDED FOR ANXIETY (Patient not taking: Reported on 10/02/2022), Disp: , Rfl:   Allergies  Allergen Reactions   Amoxicillin Hives and Rash   Clindamycin/Lincomycin Rash   Metronidazole Itching, Other (See Comments) and Rash    Mouth swelling   Penicillins Anaphylaxis and Hives   Lactose Intolerance (Gi)    Latex      ROS  Constitutional: Negative for fever or weight change.  Respiratory: Negative for cough and shortness of breath.   Cardiovascular: Negative for chest pain or palpitations.  Gastrointestinal: positive for LLQ pain,  no bowel changes.  Musculoskeletal: Negative for gait problem or joint swelling.  Skin: Negative for rash.  Neurological: Negative for dizziness or headache.  No other specific complaints in a complete review of systems (except as listed in HPI above).   Objective  Vitals:   10/02/22 0808  BP: 108/68  Pulse: 92  Resp: 16  SpO2: 99%  Weight: 141 lb (64 kg)  Height: 5' (1.524 m)    Body mass index is 27.54 kg/m.  Physical Exam  Constitutional: Patient appears well-developed and well-nourished. No distress.  HENT: Head: Normocephalic and atraumatic. Ears: B TMs ok, no erythema or effusion; Nose: Nose normal. Mouth/Throat: Oropharynx is clear and moist. No oropharyngeal exudate.  Eyes: Conjunctivae and EOM are normal.  Pupils are equal, round, and reactive to light. No scleral icterus.  Neck: Normal range of motion. Neck supple. No JVD present. No thyromegaly present.  Cardiovascular: Normal rate, regular rhythm and normal heart sounds.  No murmur heard. No BLE edema. Pulmonary/Chest: Effort normal and breath sounds normal. No respiratory distress. Abdominal: Soft. Bowel sounds are normal, no  distension. There is no tenderness. no masses Breast: no lumps or masses, no nipple discharge or rashes FEMALE GENITALIA:  External genitalia normal External urethra normal Vaginal vault normal without discharge or lesions Cervix normal without discharge or lesions Bimanual exam fullness and tenderness on LLQ  RECTAL: not done  Musculoskeletal: Normal range of motion, no joint effusions. No gross deformities Neurological: he is alert and oriented to person, place, and time. No cranial nerve deficit. Coordination, balance, strength, speech and gait are normal.  Skin: dry and pilling, worse on both hands due to eczema Psychiatric: Patient has a normal mood and affect. behavior is normal. Judgment and thought content normal.    Fall Risk:    10/02/2022    8:08 AM 08/14/2022   10:53 AM 09/13/2021    9:06 AM 08/23/2021    2:28 PM 07/20/2021   10:23 AM  Fall Risk   Falls in the past year? 0 0 0 0 0  Number falls in past yr: 0 0 0  0  Injury with Fall? 0 0 0  0  Risk for fall due to : No Fall Risks No Fall Risks  No Fall Risks No Fall Risks  Follow up Falls prevention discussed Falls prevention discussed;Education provided;Falls evaluation completed  Falls prevention discussed Falls prevention discussed     Functional Status Survey: Is the patient deaf or have difficulty hearing?: No Does the patient have difficulty seeing, even when wearing glasses/contacts?: No Does the patient have difficulty concentrating, remembering, or making decisions?: Yes Does the patient have difficulty walking or climbing stairs?: No Does the patient have difficulty dressing or bathing?: No Does the patient have difficulty doing errands alone such as visiting a doctor's office or shopping?: No   Assessment & Plan  1. Well adult exam  - Cytology - PAP - Lipid panel - CBC with Differential/Platelet - COMPLETE METABOLIC PANEL WITH GFR - Hemoglobin A1c - TSH - VITAMIN D 25 Hydroxy (Vit-D Deficiency,  Fractures)  2. Cervical cancer screening  - Cytology - PAP  3. GAD (generalized anxiety disorder)  - TSH  4. Lipid screening  - Lipid panel  5. Diabetes mellitus screening  - Hemoglobin A1c  6. Screening for deficiency anemia  - CBC with Differential/Platelet  7. Vitamin D deficiency  - VITAMIN D 25 Hydroxy (Vit-D Deficiency, Fractures)   8. Possible pregnancy  - POCT urine pregnancy  9. Acute left lower quadrant pain  - US PELVIC COMPLETE WITH TRANSVAGINAL; Future  10. History of ovarian cyst  - US PELVIC COMPLETE WITH TRANSVAGINAL; Future  -USPSTF grade A and B recommendations reviewed with patient; age-appropriate recommendations, preventive care, screening tests, etc discussed and encouraged; healthy living encouraged; see AVS for patient education given to patient -Discussed importance of 150 minutes of physical activity weekly, eat two servings of fish weekly, eat one serving of tree nuts ( cashews, pistachios, pecans, almonds.Marland Kitchen) every other day, eat 6 servings of fruit/vegetables daily and drink plenty of water and avoid sweet beverages.   -Reviewed Health Maintenance: Yes.

## 2022-10-02 ENCOUNTER — Ambulatory Visit (INDEPENDENT_AMBULATORY_CARE_PROVIDER_SITE_OTHER): Payer: BC Managed Care – PPO | Admitting: Family Medicine

## 2022-10-02 ENCOUNTER — Other Ambulatory Visit (HOSPITAL_COMMUNITY)
Admission: RE | Admit: 2022-10-02 | Discharge: 2022-10-02 | Disposition: A | Discharge status: Home / Self Care | Payer: BC Managed Care – PPO | Source: Ambulatory Visit | Attending: Family Medicine | Admitting: Family Medicine

## 2022-10-02 ENCOUNTER — Encounter: Payer: Self-pay | Admitting: Family Medicine

## 2022-10-02 VITALS — BP 108/68 | HR 92 | Resp 16 | Ht 60.0 in | Wt 141.0 lb

## 2022-10-02 DIAGNOSIS — Z130 Encounter for screening for diseases of the blood and blood-forming organs and certain disorders involving the immune mechanism: Secondary | ICD-10-CM

## 2022-10-02 DIAGNOSIS — F411 Generalized anxiety disorder: Secondary | ICD-10-CM | POA: Diagnosis not present

## 2022-10-02 DIAGNOSIS — Z0000 Encounter for general adult medical examination without abnormal findings: Secondary | ICD-10-CM

## 2022-10-02 DIAGNOSIS — Z124 Encounter for screening for malignant neoplasm of cervix: Secondary | ICD-10-CM | POA: Diagnosis not present

## 2022-10-02 DIAGNOSIS — Z3200 Encounter for pregnancy test, result unknown: Secondary | ICD-10-CM | POA: Diagnosis not present

## 2022-10-02 DIAGNOSIS — Z131 Encounter for screening for diabetes mellitus: Secondary | ICD-10-CM

## 2022-10-02 DIAGNOSIS — Z13220 Encounter for screening for lipoid disorders: Secondary | ICD-10-CM | POA: Diagnosis not present

## 2022-10-02 DIAGNOSIS — E559 Vitamin D deficiency, unspecified: Secondary | ICD-10-CM | POA: Diagnosis not present

## 2022-10-02 DIAGNOSIS — Z8742 Personal history of other diseases of the female genital tract: Secondary | ICD-10-CM

## 2022-10-02 DIAGNOSIS — R1032 Left lower quadrant pain: Secondary | ICD-10-CM

## 2022-10-02 LAB — POCT URINE PREGNANCY: Preg Test, Ur: NEGATIVE

## 2022-10-03 LAB — COMPLETE METABOLIC PANEL WITH GFR
AG Ratio: 2.1 (calc) (ref 1.0–2.5)
ALT: 20 U/L (ref 6–29)
AST: 15 U/L (ref 10–30)
Albumin: 4.2 g/dL (ref 3.6–5.1)
Alkaline phosphatase (APISO): 70 U/L (ref 31–125)
BUN: 14 mg/dL (ref 7–25)
CO2: 21 mmol/L (ref 20–32)
Calcium: 8.8 mg/dL (ref 8.6–10.2)
Chloride: 105 mmol/L (ref 98–110)
Creat: 0.66 mg/dL (ref 0.50–0.97)
Globulin: 2 g/dL (calc) (ref 1.9–3.7)
Glucose, Bld: 88 mg/dL (ref 65–99)
Potassium: 4.1 mmol/L (ref 3.5–5.3)
Sodium: 140 mmol/L (ref 135–146)
Total Bilirubin: 0.5 mg/dL (ref 0.2–1.2)
Total Protein: 6.2 g/dL (ref 6.1–8.1)
eGFR: 119 mL/min/{1.73_m2} (ref >=60)

## 2022-10-03 LAB — CBC WITH DIFFERENTIAL/PLATELET
Absolute Monocytes: 447 cells/uL (ref 200–950)
Basophils Absolute: 52 cells/uL (ref 0–200)
Basophils Relative: 1 %
Eosinophils Absolute: 151 cells/uL (ref 15–500)
Eosinophils Relative: 2.9 %
HCT: 38.4 % (ref 35.0–45.0)
Hemoglobin: 13 g/dL (ref 11.7–15.5)
Lymphs Abs: 1347 cells/uL (ref 850–3900)
MCH: 29.7 pg (ref 27.0–33.0)
MCHC: 33.9 g/dL (ref 32.0–36.0)
MCV: 87.9 fL (ref 80.0–100.0)
MPV: 10.9 fL (ref 7.5–12.5)
Monocytes Relative: 8.6 %
Neutro Abs: 3203 cells/uL (ref 1500–7800)
Neutrophils Relative %: 61.6 %
Platelets: 216 10*3/uL (ref 140–400)
RBC: 4.37 10*6/uL (ref 3.80–5.10)
RDW: 12.9 % (ref 11.0–15.0)
Total Lymphocyte: 25.9 %
WBC: 5.2 10*3/uL (ref 3.8–10.8)

## 2022-10-03 LAB — CYTOLOGY - PAP
Comment: NEGATIVE
Diagnosis: NEGATIVE
High risk HPV: NEGATIVE

## 2022-10-03 LAB — LIPID PANEL
Cholesterol: 179 mg/dL (ref <200)
HDL: 52 mg/dL (ref >=50)
LDL Cholesterol (Calc): 110 mg/dL (calc) — ABNORMAL HIGH
Non-HDL Cholesterol (Calc): 127 mg/dL (calc) (ref <130)
Total CHOL/HDL Ratio: 3.4 (calc) (ref <5.0)
Triglycerides: 77 mg/dL (ref <150)

## 2022-10-03 LAB — HEMOGLOBIN A1C
Hgb A1c MFr Bld: 5.3 % of total Hgb (ref <5.7)
Mean Plasma Glucose: 105 mg/dL
eAG (mmol/L): 5.8 mmol/L

## 2022-10-03 LAB — TSH: TSH: 2.63 mIU/L

## 2022-10-03 LAB — VITAMIN D 25 HYDROXY (VIT D DEFICIENCY, FRACTURES): Vit D, 25-Hydroxy: 22 ng/mL — ABNORMAL LOW (ref 30–100)

## 2022-10-09 ENCOUNTER — Ambulatory Visit: Payer: BC Managed Care – PPO

## 2022-10-15 DIAGNOSIS — L308 Other specified dermatitis: Secondary | ICD-10-CM | POA: Diagnosis not present

## 2022-10-15 DIAGNOSIS — Z1283 Encounter for screening for malignant neoplasm of skin: Secondary | ICD-10-CM | POA: Diagnosis not present

## 2022-10-15 DIAGNOSIS — D485 Neoplasm of uncertain behavior of skin: Secondary | ICD-10-CM | POA: Diagnosis not present

## 2022-10-15 DIAGNOSIS — D225 Melanocytic nevi of trunk: Secondary | ICD-10-CM | POA: Diagnosis not present

## 2022-10-23 ENCOUNTER — Ambulatory Visit
Admission: RE | Admit: 2022-10-23 | Discharge: 2022-10-23 | Disposition: A | Discharge status: Home / Self Care | Payer: BC Managed Care – PPO | Source: Ambulatory Visit | Attending: Family Medicine | Admitting: Family Medicine

## 2022-10-23 DIAGNOSIS — R1032 Left lower quadrant pain: Secondary | ICD-10-CM | POA: Diagnosis not present

## 2022-10-23 DIAGNOSIS — Z8742 Personal history of other diseases of the female genital tract: Secondary | ICD-10-CM | POA: Insufficient documentation

## 2022-10-31 NOTE — Progress Notes (Unsigned)
Name: Hayley Hunt   MRN: [id number]    DOB: February 24, 1991   Date:11/01/2022       Progress Note  Virtual Visit via Video Note  I connected with Tracey Harries Nikolov on 11/01/22 at 10:00 AM EDT by a video enabled telemedicine application and verified that I am speaking with the correct person using two identifiers.  Location: Patient: at home  Provider: [facility]   I discussed the limitations of evaluation and management by telemedicine and the availability of in person appointments. The patient expressed understanding and agreed to proceed.   HPI  GAD: she had her first biological child in 2020 and  developed pos-partum  depression, she went to see a therapist and was advised to see her OB. She was given a medication - she thinks it was a BZD, but it made her sleepy and she did not go back for a follow up. I saw her in 2023 and at the time she wants to try prn medication so we gave her Buspar but   made her more anxious. She is now tolerating lexapro well 5 mg, mind still races but she is not having panic attacks and no longer having to go to [facility]. She worries about multiple things all the time but able to manage the anxiety on current regiment. She is concerned about her weight gain of 10 lbs in the past month. We discussed options like adding Wellbutrin or switching to Duloxetine and she chose the later. We will do a slow titration, go down to lexapro to 2.5 mg daily and add duloxetine 30 mg and recheck in 3-4 weeks.   Vitamin D deficiency: she resumed supplementation  TMJ: she wakes up with pain on jaw line, she grinds her teeth during the night and sometimes she wakes up with a tension headache. Discussed taking baclofen at night and discussed mouth guard with her dentist   Migraine: no recent episodes  Asthma: mostly triggered by activity now, also when she has URI, currently feeling well without sob or wheezing   Patient Active Problem List   Diagnosis Date Noted   Hyperlipidemia 04/18/2020    Anxiety about health 04/18/2020   Vitamin D deficiency 05/01/2018   Gallbladder sludge 05/07/2017   Laryngopharyngeal reflux (LPR) 12/25/2015   Asthma, mild 09/13/2015   Allergic rhinitis 11/22/2014   Dermatitis, eczematoid 11/22/2014    Past Surgical History:  Procedure Laterality Date   FLEXIBLE SIGMOIDOSCOPY  03/24/2018   Procedure: FLEXIBLE SIGMOIDOSCOPY;  Surgeon: Toney Reil, MD;  Location: [facility] ENDOSCOPY;  Service: Gastroenterology;;   WISDOM TOOTH EXTRACTION      Family History  Problem Relation Age of Onset   Asthma Mother    Hypothyroidism Mother    Lupus Mother    Osteoporosis Maternal Grandmother    Heart attack Maternal Grandfather    Heart disease Maternal Grandfather    Cancer Paternal Grandfather        lung   Breast cancer Neg Hx    Ovarian cancer Neg Hx     Social History   Tobacco Use   Smoking status: Never   Smokeless tobacco: Never  Substance Use Topics   Alcohol use: No    Alcohol/week: 0.0 standard drinks of alcohol     Current Outpatient Medications:    albuterol (VENTOLIN HFA) 108 (90 Base) MCG/ACT inhaler, Inhale 1-2 puffs into the lungs every 6 (six) hours as needed for wheezing or shortness of breath. , Disp: , Rfl:    escitalopram (LEXAPRO)  5 MG tablet, Take 1 tablet (5 mg total) by mouth daily., Disp: 90 tablet, Rfl: 0   fexofenadine (ALLEGRA) 180 MG tablet, Take 1 tablet (180 mg total) by mouth daily., Disp: 30 tablet, Rfl: 1  Allergies  Allergen Reactions   Amoxicillin Hives and Rash   Clindamycin/Lincomycin Rash   Metronidazole Itching, Other (See Comments) and Rash    Mouth swelling   Penicillins Anaphylaxis and Hives   Lactose Intolerance (Gi)    Latex     I personally reviewed active problem list, medication list, allergies, family history, social history, health maintenance with the patient/caregiver today.   ROS  Ten systems reviewed and is negative except as mentioned in HPI    Objective  Vitals:    11/01/22 0922  Weight: 141 lb (64 kg)    Body mass index is 27.54 kg/m.  Physical Exam  Awake, alert and oriented   PHQ2/9:    11/01/2022    9:23 AM 10/02/2022    8:13 AM 08/14/2022   10:53 AM 09/13/2021    9:06 AM 08/23/2021    2:29 PM  Depression screen PHQ 2/9  Decreased Interest 0 1 3 0 0  Down, Depressed, Hopeless 0 0 3 0 0  PHQ - 2 Score 0 1 6 0 0  Altered sleeping 0 0 0 0 0  Tired, decreased energy 2 0 3 0 1  Change in appetite 0 0 2 0 1  Feeling bad or failure about yourself  0 0 0 0 1  Trouble concentrating 0 0 3 0 1  Moving slowly or fidgety/restless 0 0 1 0 1  Suicidal thoughts 0 0 0 0 0  PHQ-9 Score 2 1 15  0 5  Difficult doing work/chores Not difficult at all  Somewhat difficult Not difficult at all Not difficult at all    phq 9 is negative     11/01/2022    9:24 AM 10/02/2022    8:31 AM 08/14/2022   10:59 AM 08/23/2021    2:29 PM  GAD 7 : Generalized Anxiety Score  Nervous, Anxious, on Edge 1 1 3 3   Control/stop worrying 1 0 3 3  Worry too much - different things 1 1 3 3   Trouble relaxing 1 0 3 3  Restless 1 1 3 3   Easily annoyed or irritable 1 0 3 3  Afraid - awful might happen 1 1 3 3   Total GAD 7 Score 7 4 21 21   Anxiety Difficulty Very difficult Not difficult at all Extremely difficult Somewhat difficult    Improved but still positive    Fall Risk:    11/01/2022    7:59 AM 10/02/2022    8:08 AM 08/14/2022   10:53 AM 09/13/2021    9:06 AM 08/23/2021    2:28 PM  Fall Risk   Falls in the past year? 0 0 0 0 0  Number falls in past yr:  0 0 0   Injury with Fall?  0 0 0   Risk for fall due to : No Fall Risks No Fall Risks No Fall Risks  No Fall Risks  Follow up Falls prevention discussed Falls prevention discussed Falls prevention discussed;Education provided;Falls evaluation completed  Falls prevention discussed      Assessment & Plan  1. TMJ (temporomandibular joint syndrome)  - Baclofen 5 MG TABS; Take 1 tablet (5 mg total) by mouth at  bedtime.  Dispense: 90 tablet; Refill: 1  2. GAD (generalized anxiety disorder)  - escitalopram (LEXAPRO)  5 MG tablet; Take 0.5-1 tablets (2.5-5 mg total) by mouth daily.  Dispense: 30 tablet; Refill: 0 - DULoxetine (CYMBALTA) 30 MG capsule; Take 1 capsule (30 mg total) by mouth daily.  Dispense: 30 capsule; Refill: 0  3. History of migraine with aura   4. Vitamin D deficiency  Continue supplementations   5. Asthma, mild intermittent, well-controlled  - albuterol (VENTOLIN HFA) 108 (90 Base) MCG/ACT inhaler; Inhale 1-2 puffs into the lungs every 6 (six) hours as needed for wheezing or shortness of breath.  Dispense: 18 g; Refill: 0    I discussed the assessment and treatment plan with the patient. The patient was provided an opportunity to ask questions and all were answered. The patient agreed with the plan and demonstrated an understanding of the instructions.   The patient was advised to call back or seek an in-person evaluation if the symptoms worsen or if the condition fails to improve as anticipated.  I provided 25  minutes of non-face-to-face time during this encounter.   Ruel Favors, MD

## 2022-11-01 ENCOUNTER — Encounter: Payer: Self-pay | Admitting: Family Medicine

## 2022-11-01 ENCOUNTER — Telehealth (INDEPENDENT_AMBULATORY_CARE_PROVIDER_SITE_OTHER): Payer: BC Managed Care – PPO | Admitting: Family Medicine

## 2022-11-01 VITALS — Wt 150.0 lb

## 2022-11-01 DIAGNOSIS — E559 Vitamin D deficiency, unspecified: Secondary | ICD-10-CM | POA: Diagnosis not present

## 2022-11-01 DIAGNOSIS — M26609 Unspecified temporomandibular joint disorder, unspecified side: Secondary | ICD-10-CM | POA: Diagnosis not present

## 2022-11-01 DIAGNOSIS — J4520 Mild intermittent asthma, uncomplicated: Secondary | ICD-10-CM | POA: Diagnosis not present

## 2022-11-01 DIAGNOSIS — F411 Generalized anxiety disorder: Secondary | ICD-10-CM | POA: Diagnosis not present

## 2022-11-01 DIAGNOSIS — Z8669 Personal history of other diseases of the nervous system and sense organs: Secondary | ICD-10-CM

## 2022-11-01 MED ORDER — ESCITALOPRAM OXALATE 5 MG PO TABS: 2.5000 mg | ORAL_TABLET | Freq: Every day | ORAL | 0 refills | Status: DC

## 2022-11-01 MED ORDER — BACLOFEN 5 MG PO TABS: 1.0000 | ORAL_TABLET | Freq: Every evening | ORAL | 1 refills | Status: DC

## 2022-11-01 MED ORDER — DULOXETINE HCL 30 MG PO CPEP: 30.0000 mg | ORAL_CAPSULE | Freq: Every day | ORAL | 0 refills | Status: DC

## 2022-11-01 MED ORDER — ALBUTEROL SULFATE HFA 108 (90 BASE) MCG/ACT IN AERS: 1.0000 | INHALATION_SPRAY | Freq: Four times a day (QID) | RESPIRATORY_TRACT | 0 refills | Status: DC | PRN

## 2022-11-01 NOTE — Patient Instructions (Signed)
Take half dose of lexapro with duloxetine 30 mg for the next month, we will do another virtual visit in 3-4 weeks Let me know if anxiety increases in the mean time

## 2022-11-09 ENCOUNTER — Other Ambulatory Visit: Payer: Self-pay | Admitting: Family Medicine

## 2022-11-09 DIAGNOSIS — F411 Generalized anxiety disorder: Secondary | ICD-10-CM

## 2022-11-23 NOTE — Progress Notes (Unsigned)
Name: Hayley Hunt   MRN: [id number]    DOB: 1991/01/22   Date:11/26/2022       Progress Note  Subjective  Chief Complaint  Follow up  I connected with  Hayley Hunt  on 11/26/22 at  9:20 AM EDT by a video enabled telemedicine application and verified that I am speaking with the correct person using two identifiers.  I discussed the limitations of evaluation and management by telemedicine and the availability of in person appointments. The patient expressed understanding and agreed to proceed with the virtual visit  Staff also discussed with the patient that there may be a patient responsible charge related to this service. Patient Location: at home  Provider Location: [facility] Additional Individuals present: alone   HPI  GAD: she had her first biological child in 2020 and  developed pos-partum  depression, she went to see a therapist and was advised to see her OB. She was given a medication - she thinks it was a BZD, but it made her sleepy and she did not go back for a follow up. I saw her in 2023 and at the time she wants to try prn medication so we gave her Buspar but   made her more anxious. She was taking 5 mg of Lexapro and it was helping with anxiety and she was feeling well emotionally however gained 10 lbs on medication so we did a slow titration to 2.5 mg of lexapro and Duloxetine 30 mg in July 2024, she states symptoms are stable but noticed some constipation ( bowel movements every other day instead of daily - advised increase fiber and stool softener ) , we will increase Duloxetine to 60 mg and recheck in 3 months   TMJ: she wakes up with pain on jaw line, she grinds her teeth during the night and sometimes she wakes up with a tension headache. However it improve with change to Duloxetine in place of Lexapro She never took baclofen ( afraid of side effects )    Patient Active Problem List   Diagnosis Date Noted   TMJ (temporomandibular joint syndrome) 11/01/2022   Hyperlipidemia  04/18/2020   Anxiety about health 04/18/2020   Vitamin D deficiency 05/01/2018   Gallbladder sludge 05/07/2017   Laryngopharyngeal reflux (LPR) 12/25/2015   Asthma, mild 09/13/2015   Allergic rhinitis 11/22/2014   Dermatitis, eczematoid 11/22/2014    Past Surgical History:  Procedure Laterality Date   FLEXIBLE SIGMOIDOSCOPY  03/24/2018   Procedure: FLEXIBLE SIGMOIDOSCOPY;  Surgeon: Toney Reil, MD;  Location: [facility] ENDOSCOPY;  Service: Gastroenterology;;   WISDOM TOOTH EXTRACTION      Family History  Problem Relation Age of Onset   Asthma Mother    Hypothyroidism Mother    Lupus Mother    Osteoporosis Maternal Grandmother    Heart attack Maternal Grandfather    Heart disease Maternal Grandfather    Cancer Paternal Grandfather        lung   Breast cancer Neg Hx    Ovarian cancer Neg Hx     Social History   Socioeconomic History   Marital status: Married    Spouse name: Not on file   Number of children: 1   Years of education: 12   Highest education level: Associate degree: academic program  Occupational History   Not on file  Tobacco Use   Smoking status: Never   Smokeless tobacco: Never  Vaping Use   Vaping status: Never Used  Substance and Sexual Activity  Alcohol use: No    Alcohol/week: 0.0 standard drinks of alcohol   Drug use: No   Sexual activity: Yes    Partners: Male    Birth control/protection: None    Comment: none  Other Topics Concern   Not on file  Social History Narrative   Not on file   Social Determinants of Health   Financial Resource Strain: Low Risk  (10/02/2022)   Overall Financial Resource Strain (CARDIA)    Difficulty of Paying Living Expenses: Not hard at all  Food Insecurity: No Food Insecurity (10/02/2022)   Hunger Vital Sign    Worried About Running Out of Food in the Last Year: Never true    Ran Out of Food in the Last Year: Never true  Transportation Needs: No Transportation Needs (10/02/2022)   PRAPARE -  Administrator, Civil Service (Medical): No    Lack of Transportation (Non-Medical): No  Physical Activity: Sufficiently Active (10/02/2022)   Exercise Vital Sign    Days of Exercise per Week: 4 days    Minutes of Exercise per Session: 60 min  Stress: No Stress Concern Present (10/02/2022)   [employer] of Occupational Health - Occupational Stress Questionnaire    Feeling of Stress : Not at all  Social Connections: Moderately Isolated (10/02/2022)   Social Connection and Isolation Panel [NHANES]    Frequency of Communication with Friends and Family: More than three times a week    Frequency of Social Gatherings with Friends and Family: More than three times a week    Attends Religious Services: Never    Database administrator or Organizations: No    Attends Banker Meetings: Never    Marital Status: Married  Catering manager Violence: Not At Risk (10/02/2022)   Humiliation, Afraid, Rape, and Kick questionnaire    Fear of Current or Ex-Partner: No    Emotionally Abused: No    Physically Abused: No    Sexually Abused: No     Current Outpatient Medications:    albuterol (VENTOLIN HFA) 108 (90 Base) MCG/ACT inhaler, Inhale 1-2 puffs into the lungs every 6 (six) hours as needed for wheezing or shortness of breath., Disp: 18 g, Rfl: 0   Baclofen 5 MG TABS, Take 1 tablet (5 mg total) by mouth at bedtime., Disp: 90 tablet, Rfl: 1   DULoxetine (CYMBALTA) 60 MG capsule, Take 1 capsule (60 mg total) by mouth daily., Disp: 90 capsule, Rfl: 0   mupirocin ointment (BACTROBAN) 2 %, APPLY TOPICALLY TO THE AFFECTED AREA TWICE DAILY (Patient not taking: Reported on 11/26/2022), Disp: , Rfl:   Allergies  Allergen Reactions   Amoxicillin Hives and Rash   Clindamycin/Lincomycin Rash   Metronidazole Itching, Other (See Comments) and Rash    Mouth swelling   Penicillins Anaphylaxis and Hives   Lactose Intolerance (Gi)    Latex     I personally reviewed active problem  list, medication list, allergies with the patient/caregiver today.   ROS  Ten systems reviewed and is negative except as mentioned in HPI    Objective  Virtual encounter, vitals not obtained.  Body mass index is 29.29 kg/m.  Physical Exam  Awake, alert and oriented   PHQ2/9:    11/01/2022    9:23 AM 10/02/2022    8:13 AM 08/14/2022   10:53 AM 09/13/2021    9:06 AM 08/23/2021    2:29 PM  Depression screen PHQ 2/9  Decreased Interest 0 1 3 0 0  Down,  Depressed, Hopeless 0 0 3 0 0  PHQ - 2 Score 0 1 6 0 0  Altered sleeping 0 0 0 0 0  Tired, decreased energy 2 0 3 0 1  Change in appetite 0 0 2 0 1  Feeling bad or failure about yourself  0 0 0 0 1  Trouble concentrating 0 0 3 0 1  Moving slowly or fidgety/restless 0 0 1 0 1  Suicidal thoughts 0 0 0 0 0  PHQ-9 Score 2 1 15  0 5  Difficult doing work/chores Not difficult at all  Somewhat difficult Not difficult at all Not difficult at all   PHQ-2/9 Result is negative.       11/26/2022    9:49 AM 11/01/2022    9:24 AM 10/02/2022    8:31 AM 08/14/2022   10:59 AM  GAD 7 : Generalized Anxiety Score  Nervous, Anxious, on Edge 1 1 1 3   Control/stop worrying 0 1 0 3  Worry too much - different things 1 1 1 3   Trouble relaxing 1 1 0 3  Restless 0 1 1 3   Easily annoyed or irritable 1 1 0 3  Afraid - awful might happen 1 1 1 3   Total GAD 7 Score 5 7 4 21   Anxiety Difficulty  Very difficult Not difficult at all Extremely difficult    Improving   Fall Risk:    11/01/2022    7:59 AM 10/02/2022    8:08 AM 08/14/2022   10:53 AM 09/13/2021    9:06 AM 08/23/2021    2:28 PM  Fall Risk   Falls in the past year? 0 0 0 0 0  Number falls in past yr:  0 0 0   Injury with Fall?  0 0 0   Risk for fall due to : No Fall Risks No Fall Risks No Fall Risks  No Fall Risks  Follow up Falls prevention discussed Falls prevention discussed Falls prevention discussed;Education provided;Falls evaluation completed  Falls prevention discussed     Assessment & Plan  1. GAD (generalized anxiety disorder)  - DULoxetine (CYMBALTA) 60 MG capsule; Take 1 capsule (60 mg total) by mouth daily.  Dispense: 90 capsule; Refill: 0  Stop  2. TMJ (temporomandibular joint syndrome)  Doing better    I discussed the assessment and treatment plan with the patient. The patient was provided an opportunity to ask questions and all were answered. The patient agreed with the plan and demonstrated an understanding of the instructions.  The patient was advised to call back or seek an in-person evaluation if the symptoms worsen or if the condition fails to improve as anticipated.  I provided 15  minutes of non-face-to-face time during this encounter.

## 2022-11-26 ENCOUNTER — Telehealth (INDEPENDENT_AMBULATORY_CARE_PROVIDER_SITE_OTHER): Payer: BC Managed Care – PPO | Admitting: Family Medicine

## 2022-11-26 ENCOUNTER — Encounter: Payer: Self-pay | Admitting: Family Medicine

## 2022-11-26 VITALS — Ht 60.0 in | Wt 150.0 lb

## 2022-11-26 DIAGNOSIS — F411 Generalized anxiety disorder: Secondary | ICD-10-CM

## 2022-11-26 DIAGNOSIS — M26609 Unspecified temporomandibular joint disorder, unspecified side: Secondary | ICD-10-CM

## 2022-11-26 MED ORDER — DULOXETINE HCL 60 MG PO CPEP: 60.0000 mg | ORAL_CAPSULE | Freq: Every day | ORAL | 0 refills | Status: DC

## 2022-11-28 ENCOUNTER — Other Ambulatory Visit: Payer: Self-pay | Admitting: Family Medicine

## 2022-11-28 DIAGNOSIS — F411 Generalized anxiety disorder: Secondary | ICD-10-CM

## 2022-12-26 ENCOUNTER — Ambulatory Visit (INDEPENDENT_AMBULATORY_CARE_PROVIDER_SITE_OTHER): Payer: BC Managed Care – PPO | Admitting: Family Medicine

## 2022-12-26 VITALS — BP 106/66 | HR 97 | Temp 98.1°F | Resp 16 | Ht 60.0 in | Wt 142.4 lb

## 2022-12-26 DIAGNOSIS — R4589 Other symptoms and signs involving emotional state: Secondary | ICD-10-CM

## 2022-12-26 DIAGNOSIS — R519 Headache, unspecified: Secondary | ICD-10-CM | POA: Diagnosis not present

## 2022-12-26 MED ORDER — DULOXETINE HCL 30 MG PO CPEP: 30.0000 mg | ORAL_CAPSULE | Freq: Every day | ORAL | 1 refills | Status: DC

## 2022-12-26 NOTE — Progress Notes (Signed)
Patient ID: JUDIETH JUNIOR, female    DOB: 1990-10-10, 32 y.o.   MRN: [id number]  PCP: Alba Cory, MD  Chief Complaint  Patient presents with   Headache    On/off for a week has been taking ibuprofen but sometimes does not relieve the pain.    Subjective:   MILADY BETHARDS is a 32 y.o. female, presents to clinic with CC of the following:  HPI  Pt is here for Gaylyn Rong in the setting of severely increased anxiety and panic She was cross tapering meds with her PCP, lexapro was effective for her severe anxiety sx but it caused weight gain.  She suddenly stopped both meds on the cross taper, she felt horrible and since she has returned severe pain, fears of awful things happening, she can imagine worst case scenario for any moment or event of the day Return of severe anxiety she feels is causing headaches just like before.  Now she's having HA she is terrified that she has a tumor or something awful causing it She denies N, V, seizure like activity, visual disturbances  Patient Active Problem List   Diagnosis Date Noted   TMJ (temporomandibular joint syndrome) 11/01/2022   Hyperlipidemia 04/18/2020   Anxiety about health 04/18/2020   Vitamin D deficiency 05/01/2018   Gallbladder sludge 05/07/2017   Laryngopharyngeal reflux (LPR) 12/25/2015   Asthma, mild 09/13/2015   Allergic rhinitis 11/22/2014   Dermatitis, eczematoid 11/22/2014      Current Outpatient Medications:    albuterol (VENTOLIN HFA) 108 (90 Base) MCG/ACT inhaler, Inhale 1-2 puffs into the lungs every 6 (six) hours as needed for wheezing or shortness of breath., Disp: 18 g, Rfl: 0   Baclofen 5 MG TABS, Take 1 tablet (5 mg total) by mouth at bedtime., Disp: 90 tablet, Rfl: 1   DULoxetine (CYMBALTA) 60 MG capsule, Take 1 capsule (60 mg total) by mouth daily., Disp: 90 capsule, Rfl: 0   mupirocin ointment (BACTROBAN) 2 %, APPLY TOPICALLY TO THE AFFECTED AREA TWICE DAILY (Patient not taking: Reported on 11/26/2022), Disp: ,  Rfl:    Allergies  Allergen Reactions   Amoxicillin Hives and Rash   Clindamycin/Lincomycin Rash   Metronidazole Itching, Other (See Comments) and Rash    Mouth swelling   Penicillins Anaphylaxis and Hives   Lactose Intolerance (Gi)    Latex      Social History   Tobacco Use   Smoking status: Never   Smokeless tobacco: Never  Vaping Use   Vaping status: Never Used  Substance Use Topics   Alcohol use: No    Alcohol/week: 0.0 standard drinks of alcohol   Drug use: No      Chart Review Today: I personally reviewed active problem list, medication list, allergies, family history, social history, health maintenance, notes from last encounter, lab results, imaging with the patient/caregiver today.   Review of Systems  Constitutional: Negative.   HENT: Negative.    Eyes: Negative.   Respiratory: Negative.    Cardiovascular: Negative.   Gastrointestinal: Negative.   Endocrine: Negative.   Genitourinary: Negative.   Musculoskeletal: Negative.   Skin: Negative.   Allergic/Immunologic: Negative.   Neurological: Negative.   Hematological: Negative.   Psychiatric/Behavioral: Negative.    All other systems reviewed and are negative.      Objective:   Vitals:   12/26/22 1322  BP: 106/66  Pulse: 97  Resp: 16  Temp: 98.1 F (36.7 C)  TempSrc: Oral  SpO2: 99%  Weight: 142  lb 6.4 oz (64.6 kg)  Height: 5' (1.524 m)    Body mass index is 27.81 kg/m.  Physical Exam Vitals and nursing note reviewed.  Constitutional:      General: She is not in acute distress.    Appearance: She is well-developed and normal weight. She is not ill-appearing or toxic-appearing.  HENT:     Head: Normocephalic and atraumatic.     Nose: Nose normal.  Eyes:     General:        Right eye: No discharge.        Left eye: No discharge.     Conjunctiva/sclera: Conjunctivae normal.  Neck:     Trachea: No tracheal deviation.  Cardiovascular:     Rate and Rhythm: Normal rate and regular  rhythm.  Pulmonary:     Effort: Pulmonary effort is normal. No respiratory distress.     Breath sounds: No stridor.  Musculoskeletal:        General: Normal range of motion.  Skin:    General: Skin is warm and dry.     Findings: No rash.  Neurological:     Mental Status: She is alert.     Motor: No abnormal muscle tone.     Coordination: Coordination normal.     Comments: LANG/SPEECH: Naming and repetition intact, fluent, no dysarthria, follows 3-step commands, answers questions appropriately  CRANIAL NERVES:   II: Pupils equal and reactive   III, IV, VI: EOM intact, no gaze preference or deviation, no nystagmus.   V: normal sensation in V1, V2, and V3 segments bilaterally   VII: no asymmetry, no nasolabial fold flattening   VIII: normal hearing to speech   IX, X: normal palatal elevation, no uvular deviation   XI: 5/5 head turn and 5/5 shoulder shrug bilaterally   XII: midline tongue protrusion  MOTOR:  5/5 bilateral grip strength 5/5 strength dorsiflexion/plantarflexion b/l  SENSORY:  Normal to light touch Romberg absent  COORD: Normal finger to nose and heel to shin, no tremor, no dysmetria  STATION: normal stance, no truncal ataxia  GAIT: Normal; patient able to tip-toe, heel-walk.    Psychiatric:        Attention and Perception: She is inattentive.        Mood and Affect: Mood is anxious.        Speech: Speech is rapid and pressured.        Behavior: Behavior is agitated and hyperactive.     Comments: Agitated anxious continuously fidgeting      Results for orders placed or performed in visit on 10/02/22  Lipid panel  Result Value Ref Range   Cholesterol 179 <200 mg/dL   HDL 52 > OR = 50 mg/dL   Triglycerides 77 <161 mg/dL   LDL Cholesterol (Calc) 110 (H) mg/dL (calc)   Total CHOL/HDL Ratio 3.4 <5.0 (calc)   Non-HDL Cholesterol (Calc) 127 <130 mg/dL (calc)  CBC with Differential/Platelet  Result Value Ref Range   WBC 5.2 3.8 - 10.8 Thousand/uL   RBC  4.37 3.80 - 5.10 Million/uL   Hemoglobin 13.0 11.7 - 15.5 g/dL   HCT 09.6 04.5 - 40.9 %   MCV 87.9 80.0 - 100.0 fL   MCH 29.7 27.0 - 33.0 pg   MCHC 33.9 32.0 - 36.0 g/dL   RDW 81.1 91.4 - 78.2 %   Platelets 216 140 - 400 Thousand/uL   MPV 10.9 7.5 - 12.5 fL   Neutro Abs 3,203 1,500 - 7,800 cells/uL   Lymphs  Abs 1,347 850 - 3,900 cells/uL   Absolute Monocytes 447 200 - 950 cells/uL   Eosinophils Absolute 151 15 - 500 cells/uL   Basophils Absolute 52 0 - 200 cells/uL   Neutrophils Relative % 61.6 %   Total Lymphocyte 25.9 %   Monocytes Relative 8.6 %   Eosinophils Relative 2.9 %   Basophils Relative 1.0 %  COMPLETE METABOLIC PANEL WITH GFR  Result Value Ref Range   Glucose, Bld 88 65 - 99 mg/dL   BUN 14 7 - 25 mg/dL   Creat 9.81 1.91 - 4.78 mg/dL   eGFR 295 > OR = 60 AO/ZHY/8.65H8   BUN/Creatinine Ratio SEE NOTE: 6 - 22 (calc)   Sodium 140 135 - 146 mmol/L   Potassium 4.1 3.5 - 5.3 mmol/L   Chloride 105 98 - 110 mmol/L   CO2 21 20 - 32 mmol/L   Calcium 8.8 8.6 - 10.2 mg/dL   Total Protein 6.2 6.1 - 8.1 g/dL   Albumin 4.2 3.6 - 5.1 g/dL   Globulin 2.0 1.9 - 3.7 g/dL (calc)   AG Ratio 2.1 1.0 - 2.5 (calc)   Total Bilirubin 0.5 0.2 - 1.2 mg/dL   Alkaline phosphatase (APISO) 70 31 - 125 U/L   AST 15 10 - 30 U/L   ALT 20 6 - 29 U/L  Hemoglobin A1c  Result Value Ref Range   Hgb A1c MFr Bld 5.3 <5.7 % of total Hgb   Mean Plasma Glucose 105 mg/dL   eAG (mmol/L) 5.8 mmol/L  TSH  Result Value Ref Range   TSH 2.63 mIU/L  VITAMIN D 25 Hydroxy (Vit-D Deficiency, Fractures)  Result Value Ref Range   Vit D, 25-Hydroxy 22 (L) 30 - 100 ng/mL  POCT urine pregnancy  Result Value Ref Range   Preg Test, Ur Negative Negative  Cytology - PAP  Result Value Ref Range   High risk HPV Negative    Adequacy      Satisfactory for evaluation; transformation zone component PRESENT.   Diagnosis      - Negative for intraepithelial lesion or malignancy (NILM)   Comment Normal Reference Range  HPV - Negative        Assessment & Plan:     ICD-10-CM   1. Anxiety about health  R45.89 DULoxetine (CYMBALTA) 30 MG capsule    2. Acute nonintractable headache, unspecified headache type  R51.9      Pt with severe anxiety sx returned after stopping meds per PCP She is agitated, panicked, pacing the room, looks distraught, anything we briefly discussed she catastrophized about it actively She had reassuring neuro exam, no focal deficit, HA is likely tension HA, similar to past HA's that resolved when she had anxiety better managed  We reviewed tx options - she could restart lexapro since it was effective and deal with the weight SE She could also restart cymbalta - she did take it with lower dose lexapro for short time and did not have any severe SE - so returning to one of the two medications she's been on before would be less anxiety provoking than trying a new medication She states she stopped working with her therapist because it make her sx worse  Encouraged pt to come back for very close f/up  Return for 1 month fup with PCP or me anxiety/med changes .   Danelle Berry, PA-C 12/26/22 1:43 PM

## 2023-02-13 NOTE — Progress Notes (Unsigned)
Name: Hayley Hunt   MRN: [id number]    DOB: 1990/12/12   Date:02/14/2023       Progress Note  Subjective  Chief Complaint  Follow Up  HPI  GAD: she had her first biological child in 2020 and  developed pos-partum  depression, she went to see a therapist and was advised to see her OB. She was given a medication - she thinks it was a BZD, but it made her sleepy and she did not go back for a follow up. I saw her in 2023 and at the time she wants to try prn medication so we gave her Buspar but   made her more anxious. She was taking 5 mg of Lexapro and it was helping with anxiety and she was feeling well emotionally however gained 10 lbs on medication so we did a slow titration to 2.5 mg of lexapro  and Duloxetine 30 mg in July 2024, she states symptoms were  stable but noticed some constipation ( bowel movements every other day instead of daily - advised increase fiber and stool softener ) back in August we advised to go up on Duloxetine to 60 mg but patient decided to stop medication and relapsed, saw Danelle Berry 6 weeks ago due to increase in anxiety, she was grinding her teeth and is back on Duloxetine 30 mg daily , she is feeling well at this time and wants to continue current dose. When she stopped medication cold [place] she had nausea, diarrhea and felt more anxious . She is taking Duloxetine at night and has noticed that it is affecting her sleep, advised to switch to the morning   TMJ: she wakes up with pain on jaw line, she grinds her teeth during the night and sometimes she wakes up with a tension headache. She still has baclofen at the pharmacy, she was afraid to take it . Discussed mouthguard   Vitamin D : she is taking supplementation  Asthma: now mostly exercise induced, she would like to refill albuterol   Eczema: both hands, she washes hands frequently, advised lotion after hand washing    Patient Active Problem List   Diagnosis Date Noted   TMJ (temporomandibular joint  syndrome) 11/01/2022   Hyperlipidemia 04/18/2020   Anxiety about health 04/18/2020   Vitamin D deficiency 05/01/2018   Gallbladder sludge 05/07/2017   Laryngopharyngeal reflux (LPR) 12/25/2015   Asthma, mild 09/13/2015   Allergic rhinitis 11/22/2014   Dermatitis, eczematoid 11/22/2014    Past Surgical History:  Procedure Laterality Date   FLEXIBLE SIGMOIDOSCOPY  03/24/2018   Procedure: FLEXIBLE SIGMOIDOSCOPY;  Surgeon: Toney Reil, MD;  Location: [facility] END[facility]COPY;  Service: Gastroenterology;;   WISDOM TOOTH EXTRACTION      Family History  Problem Relation Age of Onset   Asthma Mother    Hypothyroidism Mother    Lupus Mother    Osteoporosis Maternal Grandmother    Heart attack Maternal Grandfather    Heart disease Maternal Grandfather    Cancer Paternal Grandfather        lung   Breast cancer Neg Hx    Ovarian cancer Neg Hx     Social History   Tobacco Use   Smoking status: Never   Smokeless tobacco: Never  Substance Use Topics   Alcohol use: No    Alcohol/week: 0.0 standard drinks of alcohol     Current Outpatient Medications:    albuterol (VENTOLIN HFA) 108 (90 Base) MCG/ACT inhaler, Inhale 1-2 puffs into the lungs every 6 (  six) hours as needed for wheezing or shortness of breath., Disp: 18 g, Rfl: 0   DULoxetine (CYMBALTA) 30 MG capsule, Take 1 capsule (30 mg total) by mouth daily., Disp: 30 capsule, Rfl: 1   Baclofen 5 MG TABS, Take 1 tablet (5 mg total) by mouth at bedtime. (Patient not taking: Reported on 02/14/2023), Disp: 90 tablet, Rfl: 1   mupirocin ointment (BACTROBAN) 2 %, APPLY TOPICALLY TO THE AFFECTED AREA TWICE DAILY (Patient not taking: Reported on 11/26/2022), Disp: , Rfl:   Allergies  Allergen Reactions   Amoxicillin Hives and Rash   Clindamycin/Lincomycin Rash   Metronidazole Itching, Other (See Comments) and Rash    Mouth swelling   Penicillins Anaphylaxis and Hives   Lactose Intolerance (Gi)    Latex     I personally reviewed active  problem list, medication list, allergies, family history, social history, health maintenance with the patient/caregiver today.   ROS  Ten systems reviewed and is negative except as mentioned in HPI    Objective  Vitals:   02/14/23 1038  BP: 108/66  Pulse: 92  Resp: 16  SpO2: 100%  Weight: 144 lb (65.3 kg)  Height: 5' (1.524 m)    Body mass index is 28.12 kg/m.  Physical Exam  Constitutional: Patient appears well-developed and well-nourished.  No distress.  HEENT: head atraumatic, normocephalic, pupils equal and reactive to light, , neck supple, throat within normal limits Cardiovascular: Normal rate, regular rhythm and normal heart sounds.  No murmur heard. No BLE edema. Skin: eczema both hands Pulmonary/Chest: Effort normal and breath sounds normal. No respiratory distress. Abdominal: Soft.  There is no tenderness. Psychiatric: Patient has a normal mood and affect. behavior is normal. Judgment and thought content normal.   PHQ2/9:    02/14/2023   10:38 AM 12/26/2022    1:21 PM 11/01/2022    9:23 AM 10/02/2022    8:13 AM 08/14/2022   10:53 AM  Depression screen PHQ 2/9  Decreased Interest 0 0 0 1 3  Down, Depressed, Hopeless 0 0 0 0 3  PHQ - 2 Score 0 0 0 1 6  Altered sleeping 3 0 0 0 0  Tired, decreased energy 3 0 2 0 3  Change in appetite 0 0 0 0 2  Feeling bad or failure about yourself  0 0 0 0 0  Trouble concentrating 0 0 0 0 3  Moving slowly or fidgety/restless 0 0 0 0 1  Suicidal thoughts 0 0 0 0 0  PHQ-9 Score 6 0 2 1 15   Difficult doing work/chores  Not difficult at all Not difficult at all  Somewhat difficult    phq 9 is negative     12/26/2022    1:27 PM 11/26/2022    9:49 AM 11/01/2022    9:24 AM 10/02/2022    8:31 AM  GAD 7 : Generalized Anxiety Score  Nervous, Anxious, on Edge 1 1 1 1   Control/stop worrying 1 0 1 0  Worry too much - different things 1 1 1 1   Trouble relaxing 1 1 1  0  Restless 1 0 1 1  Easily annoyed or irritable 1 1 1  0  Afraid -  awful might happen 1 1 1 1   Total GAD 7 Score 7 5 7 4   Anxiety Difficulty Somewhat difficult  Very difficult Not difficult at all     Fall Risk:    02/14/2023   10:37 AM 12/26/2022    1:21 PM 11/01/2022    7:59  AM 10/02/2022    8:08 AM 08/14/2022   10:53 AM  Fall Risk   Falls in the past year? 0 0 0 0 0  Number falls in past yr: 0 0  0 0  Injury with Fall? 0 0  0 0  Risk for fall due to : No Fall Risks No Fall Risks No Fall Risks No Fall Risks No Fall Risks  Follow up Falls prevention discussed Falls prevention discussed;Education provided;Falls evaluation completed Falls prevention discussed Falls prevention discussed Falls prevention discussed;Education provided;Falls evaluation completed      Functional Status Survey: Is the patient deaf or have difficulty hearing?: No Does the patient have difficulty seeing, even when wearing glasses/contacts?: No Does the patient have difficulty concentrating, remembering, or making decisions?: Yes Does the patient have difficulty walking or climbing stairs?: No Does the patient have difficulty dressing or bathing?: No Does the patient have difficulty doing errands alone such as visiting a doctor's office or shopping?: No    Assessment & Plan  1. Asthma, mild intermittent, well-controlled  - albuterol (VENTOLIN HFA) 108 (90 Base) MCG/ACT inhaler; Inhale 1-2 puffs into the lungs every 6 (six) hours as needed for wheezing or shortness of breath.  Dispense: 18 g; Refill: 0  2. TMJ (temporomandibular joint syndrome)  Waiting for dental insurance to get mouthguard  3. Anxiety about health  - DULoxetine (CYMBALTA) 30 MG capsule; Take 1 capsule (30 mg total) by mouth daily.  Dispense: 90 capsule; Refill: 1  4. GAD (generalized anxiety disorder)  - DULoxetine (CYMBALTA) 30 MG capsule; Take 1 capsule (30 mg total) by mouth daily.  Dispense: 90 capsule; Refill: 1  5. Vitamin D deficiency  Continue supplementation  6. Needs flu shot  -  Flu vaccine trivalent PF, 6mos and older(Flulaval,Afluria,Fluarix,Fluzone)  7. Eczema of both hands  - triamcinolone cream (KENALOG) 0.1 %; Apply 1 Application topically 2 (two) times daily.  Dispense: 453.6 g; Refill: 0

## 2023-02-14 ENCOUNTER — Ambulatory Visit: Payer: BC Managed Care – PPO | Admitting: Family Medicine

## 2023-02-14 ENCOUNTER — Encounter: Payer: Self-pay | Admitting: Family Medicine

## 2023-02-14 VITALS — BP 108/66 | HR 92 | Resp 16 | Ht 60.0 in | Wt 144.0 lb

## 2023-02-14 DIAGNOSIS — Z23 Encounter for immunization: Secondary | ICD-10-CM

## 2023-02-14 DIAGNOSIS — J4520 Mild intermittent asthma, uncomplicated: Secondary | ICD-10-CM

## 2023-02-14 DIAGNOSIS — L309 Dermatitis, unspecified: Secondary | ICD-10-CM

## 2023-02-14 DIAGNOSIS — M26609 Unspecified temporomandibular joint disorder, unspecified side: Secondary | ICD-10-CM | POA: Diagnosis not present

## 2023-02-14 DIAGNOSIS — R4589 Other symptoms and signs involving emotional state: Secondary | ICD-10-CM | POA: Diagnosis not present

## 2023-02-14 DIAGNOSIS — F411 Generalized anxiety disorder: Secondary | ICD-10-CM

## 2023-02-14 DIAGNOSIS — E559 Vitamin D deficiency, unspecified: Secondary | ICD-10-CM

## 2023-02-14 MED ORDER — TRIAMCINOLONE ACETONIDE 0.1 % EX CREA: 1.0000 | TOPICAL_CREAM | Freq: Two times a day (BID) | CUTANEOUS | 0 refills | Status: AC

## 2023-02-14 MED ORDER — DULOXETINE HCL 30 MG PO CPEP: 30.0000 mg | ORAL_CAPSULE | Freq: Every day | ORAL | 1 refills | Status: DC

## 2023-02-14 MED ORDER — ALBUTEROL SULFATE HFA 108 (90 BASE) MCG/ACT IN AERS: 1.0000 | INHALATION_SPRAY | Freq: Four times a day (QID) | RESPIRATORY_TRACT | 0 refills | Status: DC | PRN

## 2023-03-05 ENCOUNTER — Other Ambulatory Visit: Payer: Self-pay | Admitting: Family Medicine

## 2023-03-05 DIAGNOSIS — F411 Generalized anxiety disorder: Secondary | ICD-10-CM

## 2023-03-05 DIAGNOSIS — R4589 Other symptoms and signs involving emotional state: Secondary | ICD-10-CM

## 2023-04-15 DIAGNOSIS — Z1283 Encounter for screening for malignant neoplasm of skin: Secondary | ICD-10-CM | POA: Diagnosis not present

## 2023-04-15 DIAGNOSIS — D225 Melanocytic nevi of trunk: Secondary | ICD-10-CM | POA: Diagnosis not present

## 2023-04-30 DIAGNOSIS — F411 Generalized anxiety disorder: Secondary | ICD-10-CM | POA: Diagnosis not present

## 2023-04-30 DIAGNOSIS — F429 Obsessive-compulsive disorder, unspecified: Secondary | ICD-10-CM | POA: Diagnosis not present

## 2023-05-02 DIAGNOSIS — Z713 Dietary counseling and surveillance: Secondary | ICD-10-CM | POA: Diagnosis not present

## 2023-06-11 DIAGNOSIS — F411 Generalized anxiety disorder: Secondary | ICD-10-CM | POA: Diagnosis not present

## 2023-06-11 DIAGNOSIS — F429 Obsessive-compulsive disorder, unspecified: Secondary | ICD-10-CM | POA: Diagnosis not present

## 2023-06-20 DIAGNOSIS — F411 Generalized anxiety disorder: Secondary | ICD-10-CM | POA: Diagnosis not present

## 2023-06-20 DIAGNOSIS — F429 Obsessive-compulsive disorder, unspecified: Secondary | ICD-10-CM | POA: Diagnosis not present

## 2023-07-15 ENCOUNTER — Encounter: Payer: Self-pay | Admitting: Emergency Medicine

## 2023-07-15 ENCOUNTER — Ambulatory Visit
Admission: EM | Admit: 2023-07-15 | Discharge: 2023-07-15 | Disposition: A | Discharge status: Home / Self Care | Payer: PRIVATE HEALTH INSURANCE | Attending: Emergency Medicine | Admitting: Emergency Medicine

## 2023-07-15 ENCOUNTER — Other Ambulatory Visit: Payer: Self-pay

## 2023-07-15 DIAGNOSIS — J069 Acute upper respiratory infection, unspecified: Secondary | ICD-10-CM

## 2023-07-15 MED ORDER — PREDNISONE 20 MG PO TABS: 40.0000 mg | ORAL_TABLET | Freq: Every day | ORAL | 0 refills | Status: DC

## 2023-07-15 MED ORDER — AZITHROMYCIN 250 MG PO TABS: 250.0000 mg | ORAL_TABLET | Freq: Every day | ORAL | 0 refills | Status: DC

## 2023-07-15 NOTE — ED Provider Notes (Signed)
 Renaldo Fiddler    CSN: [id number] Arrival date & time: 07/15/23  1846      History   Chief Complaint Chief Complaint  Patient presents with   Otalgia   Sore Throat    HPI Hayley Hunt is a 33 y.o. female.   Patient presents for evaluation of a nonproductive cough, sore throat and postnasal drip present for 10 days.  Has begun to experience left-sided ear pain over the past 2 days.  Has attempted use of Mucinex.  No known sick contacts prior but is recent travel via airplane.  Denies ear drainage, decreased hearing, headache, fever.  Past Medical History:  Diagnosis Date   Allergic asthma without acute exacerbation or status asthmaticus    Asthma, mild 09/13/2015   Common ichthyosis    Eczema    GERD (gastroesophageal reflux disease)    H/O abnormal cervical Papanicolaou smear 11/22/2014   Repeat PAP on 04/04/15 normal. [facility] saw patient for LGSIL PAP with normal Colpo exam with biopsy in 2015 - 2016    History of abnormal cervical Pap smear    Ovarian cyst    Vitamin D deficiency     Patient Active Problem List   Diagnosis Date Noted   TMJ (temporomandibular joint syndrome) 11/01/2022   Hyperlipidemia 04/18/2020   Anxiety about health 04/18/2020   Vitamin D deficiency 05/01/2018   Gallbladder sludge 05/07/2017   Laryngopharyngeal reflux (LPR) 12/25/2015   Asthma, mild 09/13/2015   Allergic rhinitis 11/22/2014   Dermatitis, eczematoid 11/22/2014    Past Surgical History:  Procedure Laterality Date   FLEXIBLE SIGMOIDOSCOPY  03/24/2018   Procedure: FLEXIBLE SIGMOIDOSCOPY;  Surgeon: Toney Reil, MD;  Location: [facility];  Service: Gastroenterology;;   WISDOM TOOTH EXTRACTION      OB History     Gravida  1   Para  1   Term  1   Preterm  0   AB  0   Living  1      SAB  0   IAB  0   Ectopic  0   Multiple  0   Live Births  1        Obstetric Comments  1st Menstrual Cycle:  11             Home  Medications    Prior to Admission medications   Medication Sig Start Date End Date Taking? Authorizing Provider  azithromycin (ZITHROMAX) 250 MG tablet Take 1 tablet (250 mg total) by mouth daily. Take first 2 tablets together, then 1 every day until finished. 07/15/23  Yes Tomicka Lover R, NP  predniSONE (DELTASONE) 20 MG tablet Take 2 tablets (40 mg total) by mouth daily. 07/15/23  Yes Dorse Locy R, NP  albuterol (VENTOLIN HFA) 108 (90 Base) MCG/ACT inhaler Inhale 1-2 puffs into the lungs every 6 (six) hours as needed for wheezing or shortness of breath. 02/14/23   Alba Cory, MD  Baclofen 5 MG TABS Take 1 tablet (5 mg total) by mouth at bedtime. Patient not taking: Reported on 02/14/2023 11/01/22   Alba Cory, MD  DULoxetine (CYMBALTA) 30 MG capsule Take 1 capsule (30 mg total) by mouth daily. 02/14/23   Alba Cory, MD  triamcinolone cream (KENALOG) 0.1 % Apply 1 Application topically 2 (two) times daily. 02/14/23   Alba Cory, MD    Family History Family History  Problem Relation Age of Onset   Asthma Mother    Hypothyroidism Mother    Lupus Mother  Osteoporosis Maternal Grandmother    Heart attack Maternal Grandfather    Heart disease Maternal Grandfather    Cancer Paternal Grandfather        lung   Breast cancer Neg Hx    Ovarian cancer Neg Hx     Social History Social History   Tobacco Use   Smoking status: Never   Smokeless tobacco: Never  Vaping Use   Vaping status: Never Used  Substance Use Topics   Alcohol use: No    Alcohol/week: 0.0 standard drinks of alcohol   Drug use: No     Allergies   Amoxicillin, Clindamycin/lincomycin, Metronidazole, Penicillins, Lactose intolerance (gi), and Latex   Review of Systems Review of Systems   Physical Exam Triage Vital Signs ED Triage Vitals  Encounter Vitals Group     BP 07/15/23 1902 110/70     Systolic BP Percentile --      Diastolic BP Percentile --      Pulse Rate 07/15/23 1902 81      Resp 07/15/23 1902 16     Temp 07/15/23 1902 99.1 F (37.3 C)     Temp Source 07/15/23 1902 Oral     SpO2 07/15/23 1902 98 %     Weight --      Height --      Head Circumference --      Peak Flow --      Pain Score 07/15/23 1859 5     Pain Loc --      Pain Education --      Exclude from Growth Chart --    No data found.  Updated Vital Signs BP 110/70 (BP Location: Left Arm)   Pulse 81   Temp 99.1 F (37.3 C) (Oral)   Resp 16   LMP 06/23/2023   SpO2 98%   Visual Acuity Right Eye Distance:   Left Eye Distance:   Bilateral Distance:    Right Eye Near:   Left Eye Near:    Bilateral Near:     Physical Exam Constitutional:      Appearance: Normal appearance.  HENT:     Right Ear: Tympanic membrane, ear canal and external ear normal.     Left Ear: Tympanic membrane, ear canal and external ear normal.     Nose: Congestion present.     Mouth/Throat:     Mouth: Mucous membranes are moist.     Pharynx: No oropharyngeal exudate or posterior oropharyngeal erythema.  Eyes:     Extraocular Movements: Extraocular movements intact.  Pulmonary:     Effort: Pulmonary effort is normal.  Musculoskeletal:     Cervical back: Normal range of motion.  Lymphadenopathy:     Cervical: Cervical adenopathy present.  Neurological:     Mental Status: She is alert and oriented to person, place, and time. Mental status is at baseline.      UC Treatments / Results  Labs (all labs ordered are listed, but only abnormal results are displayed) Labs Reviewed - No data to display  EKG   Radiology No results found.  Procedures Procedures (including critical care time)  Medications Ordered in UC Medications - No data to display  Initial Impression / Assessment and Plan / UC Course  I have reviewed the triage vital signs and the nursing notes.  Pertinent labs & imaging results that were available during my care of the patient were reviewed by me and considered in my medical  decision making (see chart for details).  Acute  URI  Patient is in no signs of distress nor toxic appearing.  Vital signs are stable.  Low suspicion for pneumonia, pneumothorax or bronchitis and therefore will defer imaging.  Symptoms present for 10 days without signs of resolution will provide bacterial coverage.  Prescribed azithromycin and prednisone.May use additional over-the-counter medications as needed for supportive care.  May follow-up with urgent care as needed if symptoms persist or worsen.   Final Clinical Impressions(s) / UC Diagnoses   Final diagnoses:  Acute URI     Discharge Instructions      Signs of infection to the ear however as symptoms have lingered for 10 days without signs of resolution will provide you with bacterial coverage  Begin azithromycin as directed  You may begin prednisone every morning with food as directed to help with pain, may use Tylenol during treatment    You can take Tylenol and/or Ibuprofen as needed for fever reduction and pain relief.   For cough: honey 1/2 to 1 teaspoon (you can dilute the honey in water or another fluid).  You can also use guaifenesin and dextromethorphan for cough. You can use a humidifier for chest congestion and cough.  If you don't have a humidifier, you can sit in the bathroom with the hot shower running.      For sore throat: try warm salt water gargles, cepacol lozenges, throat spray, warm tea or water with lemon/honey, popsicles or ice, or OTC cold relief medicine for throat discomfort.   For congestion: take a daily anti-histamine like Zyrtec, Claritin, and a oral decongestant, such as pseudoephedrine.  You can also use Flonase 1-2 sprays in each nostril daily.   It is important to stay hydrated: drink plenty of fluids (water, gatorade/powerade/pedialyte, juices, or teas) to keep your throat moisturized and help further relieve irritation/discomfort.    ED Prescriptions     Medication Sig Dispense Auth.  Provider   azithromycin (ZITHROMAX) 250 MG tablet Take 1 tablet (250 mg total) by mouth daily. Take first 2 tablets together, then 1 every day until finished. 6 tablet Shandria Clinch R, NP   predniSONE (DELTASONE) 20 MG tablet Take 2 tablets (40 mg total) by mouth daily. 10 tablet Valinda Hoar, NP      PDMP not reviewed this encounter.   Valinda Hoar, [place] 07/15/23 [phone]

## 2023-07-15 NOTE — ED Triage Notes (Signed)
 Complains of sore throat and cough for 10 days.  Now left ear is hurting . Patient has not had a fever.   Patient has tried mucinex

## 2023-07-15 NOTE — Discharge Instructions (Signed)
 Signs of infection to the ear however as symptoms have lingered for 10 days without signs of resolution will provide you with bacterial coverage  Begin azithromycin as directed  You may begin prednisone every morning with food as directed to help with pain, may use Tylenol during treatment    You can take Tylenol and/or Ibuprofen as needed for fever reduction and pain relief.   For cough: honey 1/2 to 1 teaspoon (you can dilute the honey in water or another fluid).  You can also use guaifenesin and dextromethorphan for cough. You can use a humidifier for chest congestion and cough.  If you don't have a humidifier, you can sit in the bathroom with the hot shower running.      For sore throat: try warm salt water gargles, cepacol lozenges, throat spray, warm tea or water with lemon/honey, popsicles or ice, or OTC cold relief medicine for throat discomfort.   For congestion: take a daily anti-histamine like Zyrtec, Claritin, and a oral decongestant, such as pseudoephedrine.  You can also use Flonase 1-2 sprays in each nostril daily.   It is important to stay hydrated: drink plenty of fluids (water, gatorade/powerade/pedialyte, juices, or teas) to keep your throat moisturized and help further relieve irritation/discomfort.

## 2023-07-31 ENCOUNTER — Encounter: Payer: Self-pay | Admitting: Family Medicine

## 2023-07-31 ENCOUNTER — Ambulatory Visit: Payer: BC Managed Care – PPO | Admitting: Family Medicine

## 2023-07-31 ENCOUNTER — Ambulatory Visit (INDEPENDENT_AMBULATORY_CARE_PROVIDER_SITE_OTHER): Payer: PRIVATE HEALTH INSURANCE | Admitting: Family Medicine

## 2023-07-31 VITALS — BP 110/66 | HR 96 | Resp 16 | Ht 60.0 in | Wt 148.2 lb

## 2023-07-31 DIAGNOSIS — R4589 Other symptoms and signs involving emotional state: Secondary | ICD-10-CM

## 2023-07-31 DIAGNOSIS — F411 Generalized anxiety disorder: Secondary | ICD-10-CM | POA: Diagnosis not present

## 2023-07-31 DIAGNOSIS — M26609 Unspecified temporomandibular joint disorder, unspecified side: Secondary | ICD-10-CM | POA: Diagnosis not present

## 2023-07-31 DIAGNOSIS — E663 Overweight: Secondary | ICD-10-CM

## 2023-07-31 DIAGNOSIS — L309 Dermatitis, unspecified: Secondary | ICD-10-CM

## 2023-07-31 DIAGNOSIS — R635 Abnormal weight gain: Secondary | ICD-10-CM

## 2023-07-31 DIAGNOSIS — E559 Vitamin D deficiency, unspecified: Secondary | ICD-10-CM | POA: Diagnosis not present

## 2023-07-31 MED ORDER — METFORMIN HCL ER 500 MG PO TB24: 500.0000 mg | ORAL_TABLET | Freq: Every day | ORAL | 5 refills | Status: DC

## 2023-07-31 MED ORDER — DULOXETINE HCL 30 MG PO CPEP: 30.0000 mg | ORAL_CAPSULE | Freq: Every day | ORAL | 1 refills | Status: DC

## 2023-07-31 NOTE — Progress Notes (Signed)
 Name: Hayley Hunt   MRN: [id number]    DOB: 12-Jan-1991   Date:07/31/2023       Progress Note  Subjective  Chief Complaint  Chief Complaint  Patient presents with   Medical Management of Chronic Issues   HPI   GAD: she had her first biological child in 2020 and  developed pos-partum  depression, she went to see a therapist and was advised to see her OB. She was given a medication - she thinks it was a BZD, but it made her sleepy and she did not go back for a follow up. I saw her in 2023 and at the time she wants to try prn medication so we gave her Buspar but   made her more anxious. She was taking 5 mg of Lexapro and it was helping with anxiety and she was feeling well emotionally however gained 10 lbs on medication so we did a slow titration to 2.5 mg of lexapro  and Duloxetine 30 mg in July 2024. She is doing very well on current regiment, however has not been able to  lose the weight she gained on Lexapro and is getting frustrated   Vitamin D : she is taking supplementation   Asthma: now mostly exercise induced, she has albuterol at home    Eczema: both hands, she washes hands frequently, she is doing well now, using triamcinolone prn and lots of lotion  Weight gain: due to lexapro , she asked about GLP-1 agonist but afraid of side effects. Discussed contrave versus Metformin and she chose Metformin.  Contraception: not ready for a second child but not taking medications to prevent pregnancy    Patient Active Problem List   Diagnosis Date Noted   TMJ (temporomandibular joint syndrome) 11/01/2022   Hyperlipidemia 04/18/2020   Anxiety about health 04/18/2020   Vitamin D deficiency 05/01/2018   Gallbladder sludge 05/07/2017   Laryngopharyngeal reflux (LPR) 12/25/2015   Asthma, mild 09/13/2015   Allergic rhinitis 11/22/2014   Dermatitis, eczematoid 11/22/2014    Past Surgical History:  Procedure Laterality Date   FLEXIBLE SIGMOIDOSCOPY  03/24/2018   Procedure: FLEXIBLE  SIGMOIDOSCOPY;  Surgeon: Toney Reil, MD;  Location: [facility];  Service: Gastroenterology;;   WISDOM TOOTH EXTRACTION      Family History  Problem Relation Age of Onset   Asthma Mother    Hypothyroidism Mother    Lupus Mother    Osteoporosis Maternal Grandmother    Heart attack Maternal Grandfather    Heart disease Maternal Grandfather    Cancer Paternal Grandfather        lung   Breast cancer Neg Hx    Ovarian cancer Neg Hx     Social History   Tobacco Use   Smoking status: Never   Smokeless tobacco: Never  Substance Use Topics   Alcohol use: No    Alcohol/week: 0.0 standard drinks of alcohol     Current Outpatient Medications:    albuterol (VENTOLIN HFA) 108 (90 Base) MCG/ACT inhaler, Inhale 1-2 puffs into the lungs every 6 (six) hours as needed for wheezing or shortness of breath., Disp: 18 g, Rfl: 0   DULoxetine (CYMBALTA) 30 MG capsule, Take 1 capsule (30 mg total) by mouth daily., Disp: 90 capsule, Rfl: 1   triamcinolone cream (KENALOG) 0.1 %, Apply 1 Application topically 2 (two) times daily., Disp: 453.6 g, Rfl: 0  Allergies  Allergen Reactions   Amoxicillin Hives and Rash   Clindamycin/Lincomycin Rash   Metronidazole Itching, Other (See Comments)  and Rash    Mouth swelling   Penicillins Anaphylaxis and Hives   Lactose Intolerance (Gi)    Latex     I personally reviewed active problem list, medication list, allergies, family history with the patient/caregiver today.   ROS  Ten systems reviewed and is negative except as mentioned in HPI    Objective Physical Exam Constitutional: Patient appears well-developed and well-nourished. Obese  No distress.  HEENT: head atraumatic, normocephalic, pupils equal and reactive to light, neck supple Cardiovascular: Normal rate, regular rhythm and normal heart sounds.  No murmur heard. No BLE edema. Pulmonary/Chest: Effort normal and breath sounds normal. No respiratory distress. Abdominal: Soft.  There  is no tenderness. Psychiatric: Patient has a normal mood and affect. behavior is normal. Judgment and thought content normal.   Vitals:   07/31/23 0935  BP: 110/66  Pulse: 96  Resp: 16  SpO2: 100%  Weight: 148 lb 3.2 oz (67.2 kg)  Height: 5' (1.524 m)    Body mass index is 28.94 kg/m.   PHQ2/9:    07/31/2023    9:34 AM 02/14/2023   10:38 AM 12/26/2022    1:21 PM 11/01/2022    9:23 AM 10/02/2022    8:13 AM  Depression screen PHQ 2/9  Decreased Interest 0 0 0 0 1  Down, Depressed, Hopeless 0 0 0 0 0  PHQ - 2 Score 0 0 0 0 1  Altered sleeping 0 3 0 0 0  Tired, decreased energy 0 3 0 2 0  Change in appetite 0 0 0 0 0  Feeling bad or failure about yourself  0 0 0 0 0  Trouble concentrating 0 0 0 0 0  Moving slowly or fidgety/restless 0 0 0 0 0  Suicidal thoughts 0 0 0 0 0  PHQ-9 Score 0 6 0 2 1  Difficult doing work/chores Not difficult at all  Not difficult at all Not difficult at all     phq 9 is negative     07/31/2023    9:34 AM 12/26/2022    1:27 PM 11/26/2022    9:49 AM 11/01/2022    9:24 AM  GAD 7 : Generalized Anxiety Score  Nervous, Anxious, on Edge 1 1 1 1   Control/stop worrying 1 1 0 1  Worry too much - different things 1 1 1 1   Trouble relaxing 1 1 1 1   Restless 1 1 0 1  Easily annoyed or irritable 1 1 1 1   Afraid - awful might happen 1 1 1 1   Total GAD 7 Score 7 7 5 7   Anxiety Difficulty Somewhat difficult Somewhat difficult  Very difficult      Fall Risk:    02/14/2023   10:37 AM 12/26/2022    1:21 PM 11/01/2022    7:59 AM 10/02/2022    8:08 AM 08/14/2022   10:53 AM  Fall Risk   Falls in the past year? 0 0 0 0 0  Number falls in past yr: 0 0  0 0  Injury with Fall? 0 0  0 0  Risk for fall due to : No Fall Risks No Fall Risks No Fall Risks No Fall Risks No Fall Risks  Follow up Falls prevention discussed Falls prevention discussed;Education provided;Falls evaluation completed Falls prevention discussed Falls prevention discussed Falls prevention  discussed;Education provided;Falls evaluation completed     Assessment & Plan  1. GAD (generalized anxiety disorder) (Primary)  - DULoxetine (CYMBALTA) 30 MG capsule; Take 1 capsule (30 mg  total) by mouth daily.  Dispense: 90 capsule; Refill: 1  2. TMJ (temporomandibular joint syndrome)  Doing well   3. Vitamin D deficiency  Continue supplementation  4. Anxiety about health  - DULoxetine (CYMBALTA) 30 MG capsule; Take 1 capsule (30 mg total) by mouth daily.  Dispense: 90 capsule; Refill: 1  5. Eczema of both hands  Doing better   6. Weight gain due to medication  - metFORMIN (GLUCOPHAGE-XR) 500 MG 24 hr tablet; Take 1 tablet (500 mg total) by mouth daily with breakfast.  Dispense: 30 tablet; Refill: 5  7. Overweight (BMI 25.0-29.9)  - metFORMIN (GLUCOPHAGE-XR) 500 MG 24 hr tablet; Take 1 tablet (500 mg total) by mouth daily with breakfast.  Dispense: 30 tablet; Refill: 5

## 2023-08-14 DIAGNOSIS — F411 Generalized anxiety disorder: Secondary | ICD-10-CM | POA: Diagnosis not present

## 2023-08-14 DIAGNOSIS — F429 Obsessive-compulsive disorder, unspecified: Secondary | ICD-10-CM | POA: Diagnosis not present

## 2023-08-20 DIAGNOSIS — F429 Obsessive-compulsive disorder, unspecified: Secondary | ICD-10-CM | POA: Diagnosis not present

## 2023-08-20 DIAGNOSIS — F411 Generalized anxiety disorder: Secondary | ICD-10-CM | POA: Diagnosis not present

## 2023-08-26 ENCOUNTER — Encounter: Payer: Self-pay | Admitting: Family Medicine

## 2023-08-30 DIAGNOSIS — F429 Obsessive-compulsive disorder, unspecified: Secondary | ICD-10-CM | POA: Diagnosis not present

## 2023-08-30 DIAGNOSIS — F411 Generalized anxiety disorder: Secondary | ICD-10-CM | POA: Diagnosis not present

## 2023-09-16 DIAGNOSIS — F429 Obsessive-compulsive disorder, unspecified: Secondary | ICD-10-CM | POA: Diagnosis not present

## 2023-09-16 DIAGNOSIS — F411 Generalized anxiety disorder: Secondary | ICD-10-CM | POA: Diagnosis not present

## 2023-09-26 ENCOUNTER — Other Ambulatory Visit: Payer: Self-pay | Admitting: Family Medicine

## 2023-09-26 DIAGNOSIS — F411 Generalized anxiety disorder: Secondary | ICD-10-CM

## 2023-09-26 DIAGNOSIS — R4589 Other symptoms and signs involving emotional state: Secondary | ICD-10-CM

## 2023-10-02 DIAGNOSIS — F411 Generalized anxiety disorder: Secondary | ICD-10-CM | POA: Diagnosis not present

## 2023-10-02 DIAGNOSIS — F429 Obsessive-compulsive disorder, unspecified: Secondary | ICD-10-CM | POA: Diagnosis not present

## 2023-10-03 ENCOUNTER — Encounter: Payer: PRIVATE HEALTH INSURANCE | Admitting: Family Medicine

## 2023-10-09 ENCOUNTER — Encounter: Payer: Self-pay | Admitting: Family Medicine

## 2023-10-09 ENCOUNTER — Ambulatory Visit (INDEPENDENT_AMBULATORY_CARE_PROVIDER_SITE_OTHER): Payer: PRIVATE HEALTH INSURANCE | Admitting: Family Medicine

## 2023-10-09 VITALS — BP 104/64 | HR 92 | Resp 16 | Ht 60.25 in | Wt 146.9 lb

## 2023-10-09 DIAGNOSIS — E559 Vitamin D deficiency, unspecified: Secondary | ICD-10-CM

## 2023-10-09 DIAGNOSIS — Z0000 Encounter for general adult medical examination without abnormal findings: Secondary | ICD-10-CM | POA: Diagnosis not present

## 2023-10-09 DIAGNOSIS — Z13220 Encounter for screening for lipoid disorders: Secondary | ICD-10-CM | POA: Diagnosis not present

## 2023-10-09 DIAGNOSIS — F429 Obsessive-compulsive disorder, unspecified: Secondary | ICD-10-CM | POA: Insufficient documentation

## 2023-10-09 DIAGNOSIS — Z131 Encounter for screening for diabetes mellitus: Secondary | ICD-10-CM

## 2023-10-09 DIAGNOSIS — R5383 Other fatigue: Secondary | ICD-10-CM

## 2023-10-09 DIAGNOSIS — Z130 Encounter for screening for diseases of the blood and blood-forming organs and certain disorders involving the immune mechanism: Secondary | ICD-10-CM

## 2023-10-09 NOTE — Progress Notes (Signed)
 Name: Hayley Hunt   MRN: [id number]    DOB: August 05, 1990   Date:10/09/2023       Progress Note  Subjective  Chief Complaint  Chief Complaint  Patient presents with   Annual Exam    HPI  Patient presents for annual CPE.  Diet: cooks at home, does not eat fried food  Exercise: working out three to four times a week  Last Eye Exam: completed Last Dental Exam: completed  Flowsheet Row Office Visit from 10/09/2023 in [facility]  AUDIT-C Score 1    Depression: Phq 9 is  negative    10/09/2023    7:56 AM 07/31/2023    9:34 AM 02/14/2023   10:38 AM 12/26/2022    1:21 PM 11/01/2022    9:23 AM  Depression screen PHQ 2/9  Decreased Interest 0 0 0 0 0  Down, Depressed, Hopeless 0 0 0 0 0  PHQ - 2 Score 0 0 0 0 0  Altered sleeping 0 0 3 0 0  Tired, decreased energy 0 0 3 0 2  Change in appetite 0 0 0 0 0  Feeling bad or failure about yourself  0 0 0 0 0  Trouble concentrating 0 0 0 0 0  Moving slowly or fidgety/restless 0 0 0 0 0  Suicidal thoughts 0 0 0 0 0  PHQ-9 Score 0 0 6 0 2  Difficult doing work/chores Not difficult at all Not difficult at all  Not difficult at all Not difficult at all   Hypertension: BP Readings from Last 3 Encounters:  10/09/23 104/64  07/31/23 110/66  07/15/23 110/70   Obesity: Wt Readings from Last 3 Encounters:  10/09/23 146 lb 14.4 oz (66.6 kg)  07/31/23 148 lb 3.2 oz (67.2 kg)  02/14/23 144 lb (65.3 kg)   BMI Readings from Last 3 Encounters:  10/09/23 28.45 kg/m  07/31/23 28.94 kg/m  02/14/23 28.12 kg/m     Vaccines: reviewed with the patient.   Hep C Screening: completed STD testing and prevention (HIV/chl/gon/syphilis): N/A Intimate partner violence: negative screen  Sexual History : no pain or vaginal discharge  Menstrual History/LMP/Abnormal Bleeding: regular , medium flow, not a lot of cramping , she has mood changes  Discussed importance of follow up if any post-menopausal bleeding: not applicable   Incontinence Symptoms: negative for symptoms   Breast cancer:  - Last Mammogram: N/A - BRCA gene screening: N/A  Osteoporosis Prevention : Discussed high calcium and vitamin D  supplementation, weight bearing exercises Bone density :not applicable   Cervical cancer screening: up-to-date  Skin cancer: Discussed monitoring for atypical lesions  Colorectal cancer: N/A   Lung cancer:  Low Dose CT Chest recommended if Age 6-80 years, 20 pack-year currently smoking OR have quit w/in 15years. Patient does not qualify for screen   ECG: 2023  Advanced Care Planning: A voluntary discussion about advance care planning including the explanation and discussion of advance directives.  Discussed health care proxy and Living will, and the patient was able to identify a health care proxy as mother .  Patient does not have a living will and power of attorney of health care   Patient Active Problem List   Diagnosis Date Noted   OCD (obsessive compulsive disorder) 10/09/2023   TMJ (temporomandibular joint syndrome) 11/01/2022   Hyperlipidemia 04/18/2020   Anxiety about health 04/18/2020   Vitamin D  deficiency 05/01/2018   Gallbladder sludge 05/07/2017   Laryngopharyngeal reflux (LPR) 12/25/2015   Asthma, mild 09/13/2015  Allergic rhinitis 11/22/2014   Dermatitis, eczematoid 11/22/2014    Past Surgical History:  Procedure Laterality Date   FLEXIBLE SIGMOIDOSCOPY  03/24/2018   Procedure: FLEXIBLE SIGMOIDOSCOPY;  Surgeon: Selena Daily, MD;  Location: [facility] ENDOSCOPY;  Service: Gastroenterology;;   WISDOM TOOTH EXTRACTION      Family History  Problem Relation Age of Onset   Asthma Mother    Hypothyroidism Mother    Lupus Mother    Osteoporosis Maternal Grandmother    Heart attack Maternal Grandfather    Heart disease Maternal Grandfather    COPD Maternal Grandfather    Cancer Paternal Grandfather        lung   Breast cancer Neg Hx    Ovarian cancer Neg Hx     Social History    Socioeconomic History   Marital status: Married    Spouse name: Not on file   Number of children: 1   Years of education: 12   Highest education level: Associate degree: academic program  Occupational History   Not on file  Tobacco Use   Smoking status: Never   Smokeless tobacco: Never  Vaping Use   Vaping status: Never Used  Substance and Sexual Activity   Alcohol use: No    Alcohol/week: 0.0 standard drinks of alcohol   Drug use: No   Sexual activity: Yes    Partners: Male    Birth control/protection: None    Comment: none  Other Topics Concern   Not on file  Social History Narrative   Not on file   Social Drivers of Health   Financial Resource Strain: Low Risk  (10/08/2023)   Overall Financial Resource Strain (CARDIA)    Difficulty of Paying Living Expenses: Not hard at all  Food Insecurity: No Food Insecurity (10/08/2023)   Hunger Vital Sign    Worried About Running Out of Food in the Last Year: Never true    Ran Out of Food in the Last Year: Never true  Transportation Needs: No Transportation Needs (10/08/2023)   PRAPARE - Administrator, Civil Service (Medical): No    Lack of Transportation (Non-Medical): No  Physical Activity: Sufficiently Active (10/08/2023)   Exercise Vital Sign    Days of Exercise per Week: 3 days    Minutes of Exercise per Session: 50 min  Stress: Stress Concern Present (10/08/2023)   [employer] of Occupational Health - Occupational Stress Questionnaire    Feeling of Stress: To some extent  Social Connections: Moderately Isolated (10/08/2023)   Social Connection and Isolation Panel    Frequency of Communication with Friends and Family: More than three times a week    Frequency of Social Gatherings with Friends and Family: More than three times a week    Attends Religious Services: Never    Database administrator or Organizations: No    Attends Engineer, structural: Not on file    Marital Status: Married   Catering manager Violence: Not At Risk (10/09/2023)   Humiliation, Afraid, Rape, and Kick questionnaire    Fear of Current or Ex-Partner: No    Emotionally Abused: No    Physically Abused: No    Sexually Abused: No     Current Outpatient Medications:    albuterol  (VENTOLIN  HFA) 108 (90 Base) MCG/ACT inhaler, Inhale 1-2 puffs into the lungs every 6 (six) hours as needed for wheezing or shortness of breath., Disp: 18 g, Rfl: 0   DULoxetine  (CYMBALTA ) 30 MG capsule, Take 1 capsule (30  mg total) by mouth daily., Disp: 90 capsule, Rfl: 1   triamcinolone  cream (KENALOG ) 0.1 %, Apply 1 Application topically 2 (two) times daily., Disp: 453.6 g, Rfl: 0  Allergies  Allergen Reactions   Clindamycin/Lincomycin Rash   Metronidazole Itching, Other (See Comments) and Rash    Mouth swelling   Penicillins Anaphylaxis and Hives   Lactose Intolerance (Gi) Diarrhea   Latex Rash     ROS  Constitutional: Negative for fever or weight change.  Respiratory: Negative for cough and shortness of breath.   Cardiovascular: Negative for chest pain or palpitations.  Gastrointestinal: Negative for abdominal pain, no bowel changes.  Musculoskeletal: Negative for gait problem or joint swelling.  Skin: Negative for rash.  Neurological: Negative for dizziness or headache.  No other specific complaints in a complete review of systems (except as listed in HPI above).   Objective  Vitals:   10/09/23 0800  BP: 104/64  Pulse: 92  Resp: 16  SpO2: 96%  Weight: 146 lb 14.4 oz (66.6 kg)  Height: 5' 0.25 (1.53 m)    Body mass index is 28.45 kg/m.  Physical Exam  Constitutional: Patient appears well-developed and well-nourished. No distress.  HENT: Head: Normocephalic and atraumatic. Ears: B TMs ok, no erythema or effusion; Nose: Nose normal. Mouth/Throat: Oropharynx is clear and moist. No oropharyngeal exudate.  Eyes: Conjunctivae and EOM are normal. Pupils are equal, round, and reactive to light. No  scleral icterus.  Neck: Normal range of motion. Neck supple. No JVD present. No thyromegaly present.  Cardiovascular: Normal rate, regular rhythm and normal heart sounds.  No murmur heard. No BLE edema. Pulmonary/Chest: Effort normal and breath sounds normal. No respiratory distress. Abdominal: Soft. Bowel sounds are normal, no distension. There is no tenderness. no masses Breast: no lumps or masses, no nipple discharge or rashes FEMALE GENITALIA:  Not done  RECTAL: not done  Musculoskeletal: Normal range of motion, no joint effusions. No gross deformities Neurological: he is alert and oriented to person, place, and time. No cranial nerve deficit. Coordination, balance, strength, speech and gait are normal.  Skin: Skin is warm . Dry and pilling skin on her back ( eczema)  No erythema.  Psychiatric: Patient has a normal mood and affect. behavior is normal. Judgment and thought content normal.     Assessment & Plan  1. Well adult exam (Primary)  - Lipid panel - CBC with Differential/Platelet - Comprehensive metabolic panel with GFR - Hemoglobin A1c - VITAMIN D  25 Hydroxy (Vit-D Deficiency, Fractures) - B12 and Folate Panel - TSH  2. Vitamin D  deficiency  - VITAMIN D  25 Hydroxy (Vit-D Deficiency, Fractures)  3. Diabetes mellitus screening  - Hemoglobin A1c  4. Lipid screening  - Lipid panel  5. Screening for deficiency anemia  - CBC with Differential/Platelet - B12 and Folate Panel  6. Fatigue, unspecified type  - Comprehensive metabolic panel with GFR - B12 and Folate Panel - TSH     -USPSTF grade A and B recommendations reviewed with patient; age-appropriate recommendations, preventive care, screening tests, etc discussed and encouraged; healthy living encouraged; see AVS for patient education given to patient -Discussed importance of 150 minutes of physical activity weekly, eat two servings of fish weekly, eat one serving of tree nuts ( cashews, pistachios,  pecans, almonds.Aaron Aas) every other day, eat 6 servings of fruit/vegetables daily and drink plenty of water and avoid sweet beverages.   -Reviewed Health Maintenance: Yes.

## 2023-10-09 NOTE — Patient Instructions (Signed)

## 2023-10-10 LAB — LIPID PANEL
Cholesterol: 209 mg/dL — ABNORMAL HIGH (ref <200)
HDL: 48 mg/dL — ABNORMAL LOW (ref >=50)
LDL Cholesterol (Calc): 138 mg/dL — ABNORMAL HIGH
Non-HDL Cholesterol (Calc): 161 mg/dL — ABNORMAL HIGH (ref <130)
Total CHOL/HDL Ratio: 4.4 (calc) (ref <5.0)
Triglycerides: 111 mg/dL (ref <150)

## 2023-10-10 LAB — CBC WITH DIFFERENTIAL/PLATELET
Absolute Lymphocytes: 1782 {cells}/uL (ref 850–3900)
Absolute Monocytes: 281 {cells}/uL (ref 200–950)
Basophils Absolute: 59 {cells}/uL (ref 0–200)
Basophils Relative: 1.1 %
Eosinophils Absolute: 491 {cells}/uL (ref 15–500)
Eosinophils Relative: 9.1 %
HCT: 40.9 % (ref 35.0–45.0)
Hemoglobin: 13.5 g/dL (ref 11.7–15.5)
MCH: 29.8 pg (ref 27.0–33.0)
MCHC: 33 g/dL (ref 32.0–36.0)
MCV: 90.3 fL (ref 80.0–100.0)
MPV: 11 fL (ref 7.5–12.5)
Monocytes Relative: 5.2 %
Neutro Abs: 2786 {cells}/uL (ref 1500–7800)
Neutrophils Relative %: 51.6 %
Platelets: 289 10*3/uL (ref 140–400)
RBC: 4.53 10*6/uL (ref 3.80–5.10)
RDW: 13 % (ref 11.0–15.0)
Total Lymphocyte: 33 %
WBC: 5.4 10*3/uL (ref 3.8–10.8)

## 2023-10-10 LAB — COMPREHENSIVE METABOLIC PANEL WITH GFR
AG Ratio: 2 (calc) (ref 1.0–2.5)
ALT: 15 U/L (ref 6–29)
AST: 12 U/L (ref 10–30)
Albumin: 4.5 g/dL (ref 3.6–5.1)
Alkaline phosphatase (APISO): 68 U/L (ref 31–125)
BUN: 12 mg/dL (ref 7–25)
CO2: 27 mmol/L (ref 20–32)
Calcium: 9.2 mg/dL (ref 8.6–10.2)
Chloride: 105 mmol/L (ref 98–110)
Creat: 0.78 mg/dL (ref 0.50–0.97)
Globulin: 2.2 g/dL (ref 1.9–3.7)
Glucose, Bld: 92 mg/dL (ref 65–99)
Potassium: 3.9 mmol/L (ref 3.5–5.3)
Sodium: 139 mmol/L (ref 135–146)
Total Bilirubin: 0.6 mg/dL (ref 0.2–1.2)
Total Protein: 6.7 g/dL (ref 6.1–8.1)
eGFR: 102 mL/min/{1.73_m2} (ref >=60)

## 2023-10-10 LAB — VITAMIN D 25 HYDROXY (VIT D DEFICIENCY, FRACTURES): Vit D, 25-Hydroxy: 26 ng/mL — ABNORMAL LOW (ref 30–100)

## 2023-10-10 LAB — HEMOGLOBIN A1C
Hgb A1c MFr Bld: 5.3 % (ref <5.7)
Mean Plasma Glucose: 105 mg/dL
eAG (mmol/L): 5.8 mmol/L

## 2023-10-10 LAB — B12 AND FOLATE PANEL
Folate: 7.2 ng/mL
Vitamin B-12: 459 pg/mL (ref 200–1100)

## 2023-10-10 LAB — TSH: TSH: 1.91 m[IU]/L

## 2023-10-11 ENCOUNTER — Ambulatory Visit: Payer: Self-pay | Admitting: Family Medicine

## 2023-10-14 DIAGNOSIS — D225 Melanocytic nevi of trunk: Secondary | ICD-10-CM | POA: Diagnosis not present

## 2023-10-14 DIAGNOSIS — Z1283 Encounter for screening for malignant neoplasm of skin: Secondary | ICD-10-CM | POA: Diagnosis not present

## 2023-10-14 DIAGNOSIS — D485 Neoplasm of uncertain behavior of skin: Secondary | ICD-10-CM | POA: Diagnosis not present

## 2023-10-15 DIAGNOSIS — F411 Generalized anxiety disorder: Secondary | ICD-10-CM | POA: Diagnosis not present

## 2023-10-15 DIAGNOSIS — F429 Obsessive-compulsive disorder, unspecified: Secondary | ICD-10-CM | POA: Diagnosis not present

## 2023-10-29 DIAGNOSIS — F411 Generalized anxiety disorder: Secondary | ICD-10-CM | POA: Diagnosis not present

## 2023-10-29 DIAGNOSIS — F429 Obsessive-compulsive disorder, unspecified: Secondary | ICD-10-CM | POA: Diagnosis not present

## 2023-11-12 DIAGNOSIS — F411 Generalized anxiety disorder: Secondary | ICD-10-CM | POA: Diagnosis not present

## 2023-11-12 DIAGNOSIS — F429 Obsessive-compulsive disorder, unspecified: Secondary | ICD-10-CM | POA: Diagnosis not present

## 2023-11-18 DIAGNOSIS — F411 Generalized anxiety disorder: Secondary | ICD-10-CM | POA: Diagnosis not present

## 2023-11-18 DIAGNOSIS — F429 Obsessive-compulsive disorder, unspecified: Secondary | ICD-10-CM | POA: Diagnosis not present

## 2023-11-26 DIAGNOSIS — F429 Obsessive-compulsive disorder, unspecified: Secondary | ICD-10-CM | POA: Diagnosis not present

## 2023-11-26 DIAGNOSIS — F411 Generalized anxiety disorder: Secondary | ICD-10-CM | POA: Diagnosis not present

## 2023-12-10 DIAGNOSIS — F429 Obsessive-compulsive disorder, unspecified: Secondary | ICD-10-CM | POA: Diagnosis not present

## 2023-12-10 DIAGNOSIS — F411 Generalized anxiety disorder: Secondary | ICD-10-CM | POA: Diagnosis not present

## 2023-12-22 ENCOUNTER — Encounter: Payer: Self-pay | Admitting: Emergency Medicine

## 2023-12-22 ENCOUNTER — Ambulatory Visit
Admission: EM | Admit: 2023-12-22 | Discharge: 2023-12-22 | Disposition: A | Discharge status: Home / Self Care | Attending: Emergency Medicine | Admitting: Emergency Medicine

## 2023-12-22 DIAGNOSIS — H5711 Ocular pain, right eye: Secondary | ICD-10-CM

## 2023-12-22 DIAGNOSIS — H5789 Other specified disorders of eye and adnexa: Secondary | ICD-10-CM

## 2023-12-22 MED ORDER — MOXIFLOXACIN HCL 0.5 % OP SOLN: 1.0000 [drp] | Freq: Three times a day (TID) | OPHTHALMIC | 0 refills | Status: DC

## 2023-12-22 MED ORDER — PREDNISONE 10 MG (21) PO TBPK: ORAL_TABLET | Freq: Every day | ORAL | 0 refills | Status: DC

## 2023-12-22 NOTE — ED Provider Notes (Signed)
 Hayley Hunt    CSN: [id number] Arrival date & time: 12/22/23  1132      History   Chief Complaint Chief Complaint  Patient presents with   Eye Problem    HPI Hayley Hunt is a 33 y.o. female.   Patient presents for evaluation of right eye erythema, swelling and pain beginning 3 days ago.  Endorses 4 days ago she accidentally got facial cream into the eye.  Had 1 day of crusting and drainage which has resolved.  Denies visual changes, light sensitivity, injury or trauma.  Has attempted use of over-the-counter Visine which has not provided relief.  Past Medical History:  Diagnosis Date   Allergic asthma without acute exacerbation or status asthmaticus    Allergy    Anxiety    Asthma, mild 09/13/2015   Common ichthyosis    Eczema    GERD (gastroesophageal reflux disease)    H/O abnormal cervical Papanicolaou smear 11/22/2014   Repeat PAP on 04/04/15 normal. [facility] saw patient for LGSIL PAP with normal Colpo exam with biopsy in 2015 - 2016    History of abnormal cervical Pap smear    Ovarian cyst    Vitamin D  deficiency     Patient Active Problem List   Diagnosis Date Noted   OCD (obsessive compulsive disorder) 10/09/2023   TMJ (temporomandibular joint syndrome) 11/01/2022   Hyperlipidemia 04/18/2020   Anxiety about health 04/18/2020   Vitamin D  deficiency 05/01/2018   Gallbladder sludge 05/07/2017   Laryngopharyngeal reflux (LPR) 12/25/2015   Asthma, mild 09/13/2015   Allergic rhinitis 11/22/2014   Dermatitis, eczematoid 11/22/2014    Past Surgical History:  Procedure Laterality Date   FLEXIBLE SIGMOIDOSCOPY  03/24/2018   Procedure: FLEXIBLE SIGMOIDOSCOPY;  Surgeon: Unk Corinn Skiff, MD;  Location: [facility] ENDOSCOPY;  Service: Gastroenterology;;   WISDOM TOOTH EXTRACTION      OB History     Gravida  1   Para  1   Term  1   Preterm  0   AB  0   Living  1      SAB  0   IAB  0   Ectopic  0   Multiple  0   Live Births  1         Obstetric Comments  1st Menstrual Cycle:  11             Home Medications    Prior to Admission medications   Medication Sig Start Date End Date Taking? Authorizing Provider  moxifloxacin  (VIGAMOX ) 0.5 % ophthalmic solution Place 1 drop into the right eye 3 (three) times daily. 12/22/23  Yes Anabeth Chilcott R, NP  predniSONE  (STERAPRED UNI-PAK 21 TAB) 10 MG (21) TBPK tablet Take by mouth daily. Take 6 tabs by mouth daily  for 1 days, then 5 tabs for 1 days, then 4 tabs for 1 days, then 3 tabs for 1 days, 2 tabs for 1 days, then 1 tab by mouth daily for 1 days 12/22/23  Yes Sonakshi Rolland R, NP  albuterol  (VENTOLIN  HFA) 108 (90 Base) MCG/ACT inhaler Inhale 1-2 puffs into the lungs every 6 (six) hours as needed for wheezing or shortness of breath. 02/14/23   Sowles, Krichna, MD  DULoxetine  (CYMBALTA ) 30 MG capsule Take 1 capsule (30 mg total) by mouth daily. 07/31/23   Sowles, Krichna, MD  triamcinolone  cream (KENALOG ) 0.1 % Apply 1 Application topically 2 (two) times daily. 02/14/23   Sowles, Krichna, MD    Family History  Family History  Problem Relation Age of Onset   Asthma Mother    Hypothyroidism Mother    Lupus Mother    Osteoporosis Maternal Grandmother    Heart attack Maternal Grandfather    Heart disease Maternal Grandfather    COPD Maternal Grandfather    Cancer Paternal Grandfather        lung   Breast cancer Neg Hx    Ovarian cancer Neg Hx     Social History Social History   Tobacco Use   Smoking status: Never   Smokeless tobacco: Never  Vaping Use   Vaping status: Never Used  Substance Use Topics   Alcohol use: No    Alcohol/week: 0.0 standard drinks of alcohol   Drug use: No     Allergies   Clindamycin/lincomycin, Metronidazole, Penicillins, Lactose intolerance (gi), and Latex   Review of Systems Review of Systems   Physical Exam Triage Vital Signs ED Triage Vitals  Encounter Vitals Group     BP 12/22/23 1237 106/72     Girls  Systolic BP Percentile --      Girls Diastolic BP Percentile --      Boys Systolic BP Percentile --      Boys Diastolic BP Percentile --      Pulse Rate 12/22/23 1237 68     Resp 12/22/23 1237 18     Temp 12/22/23 1237 98.2 F (36.8 C)     Temp Source 12/22/23 1237 Oral     SpO2 12/22/23 1237 98 %     Weight --      Height --      Head Circumference --      Peak Flow --      Pain Score 12/22/23 1235 1     Pain Loc --      Pain Education --      Exclude from Growth Chart --    No data found.  Updated Vital Signs BP 106/72 (BP Location: Left Arm)   Pulse 68   Temp 98.2 F (36.8 C) (Oral)   Resp 18   LMP 12/08/2023 (Exact Date)   SpO2 98%   Visual Acuity Right Eye Distance: 20/20 Left Eye Distance: 2020 Bilateral Distance: 20/16  Right Eye Near:   Left Eye Near:    Bilateral Near:     Physical Exam Constitutional:      Appearance: Normal appearance.  Eyes:     Comments: Swelling and erythema present to the right lower periorbital region into the right conjunctiva, no drainage noted on exam, vision grossly intact extraocular movements intact, no stye noted  Pulmonary:     Effort: Pulmonary effort is normal.  Neurological:     Mental Status: She is alert and oriented to person, place, and time.      UC Treatments / Results  Labs (all labs ordered are listed, but only abnormal results are displayed) Labs Reviewed - No data to display  EKG   Radiology No results found.  Procedures Procedures (including critical care time)  Medications Ordered in UC Medications - No data to display  Initial Impression / Assessment and Plan / UC Course  I have reviewed the triage vital signs and the nursing notes.  Pertinent labs & imaging results that were available during my care of the patient were reviewed by me and considered in my medical decision making (see chart for details).  Acute right eye pain, right eye swelling  Vision grossly intact, low suspicion for  corneal abrasion,  denies injury, symptoms most likely related to reaction to facial cream however empirically providing antibiotics to prevent infection, prescribed moxifloxacin  as well as oral prednisone  advised against eye rubbing and touching but may use cool to warm compresses for general comfort, if no improvement seen within 72 hours patient to follow-up with ophthalmologist, information given Final Clinical Impressions(s) / UC Diagnoses   Final diagnoses:  Acute right eye pain  Eye swelling, right     Discharge Instructions      Today you are evaluated for the pain and swelling to your eye, do believe this is related to the facial cream getting inside the eye causing irritation, will empirically place you on antibiotic eyedrops for coverage for infection and to ensure that does not occur  Place 1 drop of moxifloxacin  into the right eye every 8 hours for 7 days  To help with pain and swelling begin prednisone  every morning with food as directed, avoid use of ibuprofen  while taking but may use Tylenol   May apply cold or warm compresses over the eye for general comfort  Avoid eye rubbing and touching as this will cause further irritation  If you have not seen any improvement within 72 hours of using medicine please follow-up with eye doctor whose information is on for page   ED Prescriptions     Medication Sig Dispense Auth. Provider   predniSONE  (STERAPRED UNI-PAK 21 TAB) 10 MG (21) TBPK tablet Take by mouth daily. Take 6 tabs by mouth daily  for 1 days, then 5 tabs for 1 days, then 4 tabs for 1 days, then 3 tabs for 1 days, 2 tabs for 1 days, then 1 tab by mouth daily for 1 days 21 tablet Meeyah Ovitt R, NP   moxifloxacin  (VIGAMOX ) 0.5 % ophthalmic solution Place 1 drop into the right eye 3 (three) times daily. 3 mL Teresa Shelba SAUNDERS, NP      PDMP not reviewed this encounter.   Teresa Shelba SAUNDERS, NP 12/22/23 1316

## 2023-12-22 NOTE — ED Triage Notes (Signed)
 Patient redness, swelling and pain lower eyelid that x 3 days ago. Patient stated she only used VISINE eye drops with no relief. Rates pain 1/10.

## 2023-12-22 NOTE — Discharge Instructions (Signed)
 Today you are evaluated for the pain and swelling to your eye, do believe this is related to the facial cream getting inside the eye causing irritation, will empirically place you on antibiotic eyedrops for coverage for infection and to ensure that does not occur  Place 1 drop of moxifloxacin  into the right eye every 8 hours for 7 days  To help with pain and swelling begin prednisone  every morning with food as directed, avoid use of ibuprofen  while taking but may use Tylenol   May apply cold or warm compresses over the eye for general comfort  Avoid eye rubbing and touching as this will cause further irritation  If you have not seen any improvement within 72 hours of using medicine please follow-up with eye doctor whose information is on for page

## 2024-01-14 DIAGNOSIS — F411 Generalized anxiety disorder: Secondary | ICD-10-CM | POA: Diagnosis not present

## 2024-01-14 DIAGNOSIS — F429 Obsessive-compulsive disorder, unspecified: Secondary | ICD-10-CM | POA: Diagnosis not present

## 2024-01-29 DIAGNOSIS — F429 Obsessive-compulsive disorder, unspecified: Secondary | ICD-10-CM | POA: Diagnosis not present

## 2024-01-29 DIAGNOSIS — F411 Generalized anxiety disorder: Secondary | ICD-10-CM | POA: Diagnosis not present

## 2024-01-30 ENCOUNTER — Ambulatory Visit: Payer: PRIVATE HEALTH INSURANCE | Admitting: Family Medicine

## 2024-02-03 ENCOUNTER — Other Ambulatory Visit: Payer: Self-pay | Admitting: Family Medicine

## 2024-02-03 DIAGNOSIS — E663 Overweight: Secondary | ICD-10-CM

## 2024-02-03 DIAGNOSIS — R635 Abnormal weight gain: Secondary | ICD-10-CM

## 2024-02-05 DIAGNOSIS — F429 Obsessive-compulsive disorder, unspecified: Secondary | ICD-10-CM | POA: Diagnosis not present

## 2024-02-05 DIAGNOSIS — F411 Generalized anxiety disorder: Secondary | ICD-10-CM | POA: Diagnosis not present

## 2024-02-25 ENCOUNTER — Ambulatory Visit: Payer: PRIVATE HEALTH INSURANCE | Admitting: Family Medicine

## 2024-02-25 ENCOUNTER — Encounter: Payer: Self-pay | Admitting: Family Medicine

## 2024-02-25 VITALS — BP 110/68 | HR 87 | Temp 98.0°F | Resp 16 | Ht 60.0 in | Wt 143.0 lb

## 2024-02-25 DIAGNOSIS — M26609 Unspecified temporomandibular joint disorder, unspecified side: Secondary | ICD-10-CM

## 2024-02-25 DIAGNOSIS — L309 Dermatitis, unspecified: Secondary | ICD-10-CM

## 2024-02-25 DIAGNOSIS — F428 Other obsessive-compulsive disorder: Secondary | ICD-10-CM

## 2024-02-25 DIAGNOSIS — J4520 Mild intermittent asthma, uncomplicated: Secondary | ICD-10-CM

## 2024-02-25 DIAGNOSIS — E559 Vitamin D deficiency, unspecified: Secondary | ICD-10-CM

## 2024-02-25 DIAGNOSIS — E785 Hyperlipidemia, unspecified: Secondary | ICD-10-CM

## 2024-02-25 DIAGNOSIS — Z23 Encounter for immunization: Secondary | ICD-10-CM | POA: Diagnosis not present

## 2024-02-25 DIAGNOSIS — F411 Generalized anxiety disorder: Secondary | ICD-10-CM

## 2024-02-25 MED ORDER — ALBUTEROL SULFATE HFA 108 (90 BASE) MCG/ACT IN AERS: 1.0000 | INHALATION_SPRAY | Freq: Four times a day (QID) | RESPIRATORY_TRACT | 0 refills | Status: AC | PRN

## 2024-02-25 MED ORDER — DULOXETINE HCL 30 MG PO CPEP: 30.0000 mg | ORAL_CAPSULE | Freq: Every day | ORAL | 1 refills | Status: AC

## 2024-02-25 NOTE — Progress Notes (Signed)
 Name: Hayley Hunt   MRN: [id number]    DOB: May 20, 1990   Date:02/25/2024       Progress Note  Subjective  Chief Complaint  Chief Complaint  Patient presents with   Medical Management of Chronic Issues   Fatigue   Discussed the use of AI scribe software for clinical note transcription with the patient, who gave verbal consent to proceed.  History of Present Illness Hayley Hunt is a 33 year old female with anxiety and asthma who presents with increased fatigue and a request for an inhaler.  She has experienced increased fatigue over the past week, attributing it to a recent cold and life stressors. She feels extremely tired, and caffeine  does not alleviate her fatigue. Her sleep schedule has been irregular, with a recent early bedtime due to exhaustion. She takes vitamin D  supplements inconsistently, which she believes may contribute to her fatigue.  She reports anxiety, which she takes duloxetine  30 mg daily for, and states it has been exacerbated by her husband's recent tongue cancer diagnosis, leading to obsessive-compulsive behaviors such as compulsive cleaning and excessive research related to his health condition.  She has asthma and requests an albuterol  inhaler due to trouble breathing and coughing at night during her recent illness. She currently does not have an inhaler.  She has eczema and reports using triamcinolone  cream as needed, and states she has enough medication. Additionally, she has a history of TMJ and reports that it is less tight and sore than previously, though it remains sore when pressed.  Her cholesterol levels were previously elevated, with an LDL of 138 mg/dL and low HDL.    Patient Active Problem List   Diagnosis Date Noted   OCD (obsessive compulsive disorder) 10/09/2023   TMJ (temporomandibular joint syndrome) 11/01/2022   Hyperlipidemia 04/18/2020   Anxiety about health 04/18/2020   Vitamin D  deficiency 05/01/2018   Gallbladder sludge 05/07/2017    Laryngopharyngeal reflux (LPR) 12/25/2015   Asthma, mild 09/13/2015   Allergic rhinitis 11/22/2014   Dermatitis, eczematoid 11/22/2014    Past Surgical History:  Procedure Laterality Date   FLEXIBLE SIGMOIDOSCOPY  03/24/2018   Procedure: FLEXIBLE SIGMOIDOSCOPY;  Surgeon: Unk Corinn Skiff, MD;  Location: [facility] ENDOSCOPY;  Service: Gastroenterology;;   WISDOM TOOTH EXTRACTION      Family History  Problem Relation Age of Onset   Asthma Mother    Hypothyroidism Mother    Lupus Mother    Osteoporosis Maternal Grandmother    Heart attack Maternal Grandfather    Heart disease Maternal Grandfather    COPD Maternal Grandfather    Cancer Paternal Grandfather        lung   Breast cancer Neg Hx    Ovarian cancer Neg Hx     Social History   Tobacco Use   Smoking status: Never   Smokeless tobacco: Never  Substance Use Topics   Alcohol use: No    Alcohol/week: 0.0 standard drinks of alcohol     Current Outpatient Medications:    albuterol  (VENTOLIN  HFA) 108 (90 Base) MCG/ACT inhaler, Inhale 1-2 puffs into the lungs every 6 (six) hours as needed for wheezing or shortness of breath., Disp: 18 g, Rfl: 0   DULoxetine  (CYMBALTA ) 30 MG capsule, Take 1 capsule (30 mg total) by mouth daily., Disp: 90 capsule, Rfl: 1   triamcinolone  cream (KENALOG ) 0.1 %, Apply 1 Application topically 2 (two) times daily., Disp: 453.6 g, Rfl: 0  Allergies  Allergen Reactions   Clindamycin/Lincomycin Rash  Metronidazole Itching, Other (See Comments) and Rash    Mouth swelling   Penicillins Anaphylaxis and Hives   Lactose Intolerance (Gi) Diarrhea   Latex Rash    I personally reviewed active problem list, medication list, allergies with the patient/caregiver today.   ROS  Ten systems reviewed and is negative except as mentioned in HPI    Objective Physical Exam CONSTITUTIONAL: Patient appears well-developed and well-nourished. No distress. HEENT: Head atraumatic, normocephalic, neck  supple. CARDIOVASCULAR: Normal rate, regular rhythm and normal heart sounds. No murmur heard. No BLE edema. PULMONARY: Effort normal and breath sounds normal. Lungs clear to auscultation. No respiratory distress. ABDOMINAL: There is no tenderness or distention. MUSCULOSKELETAL: Normal gait. Without gross motor or sensory deficit. SKIN: dry eczematous hands PSYCHIATRIC: Patient has a normal mood and affect. Behavior is normal. Judgment and thought content normal.  Vitals:   02/25/24 1111  BP: 110/68  Pulse: 87  Resp: 16  Temp: 98 F (36.7 C)  SpO2: 98%  Weight: 143 lb (64.9 kg)  Height: 5' (1.524 m)    Body mass index is 27.93 kg/m.    PHQ2/9:    02/25/2024   11:14 AM 10/09/2023    7:56 AM 07/31/2023    9:34 AM 02/14/2023   10:38 AM 12/26/2022    1:21 PM  Depression screen PHQ 2/9  Decreased Interest 1 0 0 0 0  Down, Depressed, Hopeless 0 0 0 0 0  PHQ - 2 Score 1 0 0 0 0  Altered sleeping 2 0 0 3 0  Tired, decreased energy 2 0 0 3 0  Change in appetite 2 0 0 0 0  Feeling bad or failure about yourself   0 0 0 0  Trouble concentrating 0 0 0 0 0  Moving slowly or fidgety/restless 0 0 0 0 0  Suicidal thoughts 0 0 0 0 0  PHQ-9 Score 7 0 0 6 0  Difficult doing work/chores Somewhat difficult Not difficult at all Not difficult at all  Not difficult at all    phq 9 is positive  Fall Risk:    02/25/2024   11:14 AM 10/09/2023    7:56 AM 02/14/2023   10:37 AM 12/26/2022    1:21 PM 11/01/2022    7:59 AM  Fall Risk   Falls in the past year? 0 0 0 0 0  Number falls in past yr: 0 0 0 0   Injury with Fall? 0 0 0 0   Risk for fall due to :  No Fall Risks No Fall Risks No Fall Risks No Fall Risks  Follow up Falls evaluation completed Falls prevention discussed;Education provided;Falls evaluation completed Falls prevention discussed Falls prevention discussed;Education provided;Falls evaluation completed Falls prevention discussed      Assessment & Plan Generalized anxiety  disorder and obsessive-compulsive disorder Anxiety well-managed with duloxetine  30 mg. Stress from husband's cancer diagnosis contributes to fatigue. OCD manifests as compulsive cleaning and excessive research. Current duloxetine  dose effective; she prefers not to increase due to potential side effects. - Continue duloxetine  30 mg daily. - Encouraged stress management techniques and staying busy with controllable activities.  Mild intermittent asthma Recent exacerbation due to cold and cough, leading to nocturnal dyspnea. No inhaler available during exacerbation. - Prescribed albuterol  inhaler for asthma exacerbations.  Hyperlipidemia LDL cholesterol elevated at 138 mg/dL. HDL cholesterol low. Triglycerides normal. No current medication for hyperlipidemia. - Encouraged increased physical activity. - Advised dietary modifications including increased intake of fish and tree nuts.  Vitamin  D deficiency Inconsistent vitamin D  supplementation, possibly contributing to fatigue. - Encouraged consistent vitamin D  supplementation.  General Health Maintenance Received flu shot today.

## 2024-08-24 ENCOUNTER — Ambulatory Visit: Admitting: Family Medicine

## 2024-10-09 ENCOUNTER — Encounter: Admitting: Family Medicine
